# Patient Record
Sex: Female | Born: 1955 | Race: White | Hispanic: No | Smoking: Current every day smoker
Health system: Southern US, Community
[De-identification: ages and names within clinical notes are randomized; demographics above are authoritative.]

## PROBLEM LIST (undated history)

## (undated) DIAGNOSIS — Z720 Tobacco use: Secondary | ICD-10-CM

## (undated) DIAGNOSIS — I4891 Unspecified atrial fibrillation: Secondary | ICD-10-CM

## (undated) DIAGNOSIS — N321 Vesicointestinal fistula: Secondary | ICD-10-CM

## (undated) DIAGNOSIS — I480 Paroxysmal atrial fibrillation: Secondary | ICD-10-CM

## (undated) DIAGNOSIS — I10 Essential (primary) hypertension: Secondary | ICD-10-CM

## (undated) DIAGNOSIS — M109 Gout, unspecified: Secondary | ICD-10-CM

## (undated) DIAGNOSIS — F419 Anxiety disorder, unspecified: Secondary | ICD-10-CM

## (undated) DIAGNOSIS — N824 Other female intestinal-genital tract fistulae: Secondary | ICD-10-CM

## (undated) DIAGNOSIS — I499 Cardiac arrhythmia, unspecified: Secondary | ICD-10-CM

## (undated) HISTORY — DX: Gout, unspecified: M10.9

---

## 1983-07-24 HISTORY — PX: MANDIBLE SURGERY: SHX707

## 2000-07-23 HISTORY — PX: CARPAL TUNNEL RELEASE: SHX101

## 2001-08-20 ENCOUNTER — Ambulatory Visit (HOSPITAL_BASED_OUTPATIENT_CLINIC_OR_DEPARTMENT_OTHER): Admission: RE | Admit: 2001-08-20 | Discharge: 2001-08-20 | Payer: Self-pay | Admitting: *Deleted

## 2018-09-24 ENCOUNTER — Encounter: Payer: Self-pay | Admitting: Family Medicine

## 2018-09-24 ENCOUNTER — Ambulatory Visit (INDEPENDENT_AMBULATORY_CARE_PROVIDER_SITE_OTHER): Payer: 59 | Admitting: Family Medicine

## 2018-09-24 VITALS — BP 130/82 | HR 84 | Temp 98.4°F | Resp 15 | Ht 64.0 in | Wt 130.0 lb

## 2018-09-24 DIAGNOSIS — Z13220 Encounter for screening for lipoid disorders: Secondary | ICD-10-CM

## 2018-09-24 DIAGNOSIS — Z0000 Encounter for general adult medical examination without abnormal findings: Secondary | ICD-10-CM

## 2018-09-24 DIAGNOSIS — Z7689 Persons encountering health services in other specified circumstances: Secondary | ICD-10-CM

## 2018-09-24 DIAGNOSIS — Z1159 Encounter for screening for other viral diseases: Secondary | ICD-10-CM

## 2018-09-24 DIAGNOSIS — F17200 Nicotine dependence, unspecified, uncomplicated: Secondary | ICD-10-CM

## 2018-09-24 DIAGNOSIS — F419 Anxiety disorder, unspecified: Secondary | ICD-10-CM | POA: Diagnosis not present

## 2018-09-24 DIAGNOSIS — Z1231 Encounter for screening mammogram for malignant neoplasm of breast: Secondary | ICD-10-CM | POA: Diagnosis not present

## 2018-09-24 DIAGNOSIS — Z0001 Encounter for general adult medical examination with abnormal findings: Secondary | ICD-10-CM

## 2018-09-24 DIAGNOSIS — J309 Allergic rhinitis, unspecified: Secondary | ICD-10-CM

## 2018-09-24 DIAGNOSIS — Z1211 Encounter for screening for malignant neoplasm of colon: Secondary | ICD-10-CM | POA: Diagnosis not present

## 2018-09-24 DIAGNOSIS — Z130 Encounter for screening for diseases of the blood and blood-forming organs and certain disorders involving the immune mechanism: Secondary | ICD-10-CM

## 2018-09-24 DIAGNOSIS — Z114 Encounter for screening for human immunodeficiency virus [HIV]: Secondary | ICD-10-CM

## 2018-09-24 DIAGNOSIS — Z23 Encounter for immunization: Secondary | ICD-10-CM

## 2018-09-24 MED ORDER — ESCITALOPRAM OXALATE 10 MG PO TABS: ORAL_TABLET | ORAL | 0 refills | Status: DC

## 2018-09-24 MED ORDER — ZOSTER VAC RECOMB ADJUVANTED 50 MCG/0.5ML IM SUSR: 0.5000 mL | Freq: Once | INTRAMUSCULAR | 1 refills | Status: AC

## 2018-09-24 NOTE — Patient Instructions (Signed)
Health Maintenance, Female Adopting a healthy lifestyle and getting preventive care can go a long way to promote health and wellness. Talk with your health care provider about what schedule of regular examinations is right for you. This is a good chance for you to check in with your provider about disease prevention and staying healthy. In between checkups, there are plenty of things you can do on your own. Experts have done a lot of research about which lifestyle changes and preventive measures are most likely to keep you healthy. Ask your health care provider for more information. Weight and diet Eat a healthy diet  Be sure to include plenty of vegetables, fruits, low-fat dairy products, and lean protein.  Do not eat a lot of foods high in solid fats, added sugars, or salt.  Get regular exercise. This is one of the most important things you can do for your health. ? Most adults should exercise for at least 150 minutes each week. The exercise should increase your heart rate and make you sweat (moderate-intensity exercise). ? Most adults should also do strengthening exercises at least twice a week. This is in addition to the moderate-intensity exercise. Maintain a healthy weight  Body mass index (BMI) is a measurement that can be used to identify possible weight problems. It estimates body fat based on height and weight. Your health care provider can help determine your BMI and help you achieve or maintain a healthy weight.  For females 5 years of age and older: ? A BMI below 18.5 is considered underweight. ? A BMI of 18.5 to 24.9 is normal. ? A BMI of 25 to 29.9 is considered overweight. ? A BMI of 30 and above is considered obese. Watch levels of cholesterol and blood lipids  You should start having your blood tested for lipids and cholesterol at 64 years of age, then have this test every 5 years.  You may need to have your cholesterol levels checked more often if: ? Your lipid or  cholesterol levels are high. ? You are older than 64 years of age. ? You are at high risk for heart disease. Cancer screening Lung Cancer  Lung cancer screening is recommended for adults 48-79 years old who are at high risk for lung cancer because of a history of smoking.  A yearly low-dose CT scan of the lungs is recommended for people who: ? Currently smoke. ? Have quit within the past 15 years. ? Have at least a 30-pack-year history of smoking. A pack year is smoking an average of one pack of cigarettes a day for 1 year.  Yearly screening should continue until it has been 15 years since you quit.  Yearly screening should stop if you develop a health problem that would prevent you from having lung cancer treatment. Breast Cancer  Practice breast self-awareness. This means understanding how your breasts normally appear and feel.  It also means doing regular breast self-exams. Let your health care provider know about any changes, no matter how small.  If you are in your 20s or 30s, you should have a clinical breast exam (CBE) by a health care provider every 1-3 years as part of a regular health exam.  If you are 22 or older, have a CBE every year. Also consider having a breast X-ray (mammogram) every year.  If you have a family history of breast cancer, talk to your health care provider about genetic screening.  If you are at high risk for breast cancer, talk  to your health care provider about having an MRI and a mammogram every year.  Breast cancer gene (BRCA) assessment is recommended for women who have family members with BRCA-related cancers. BRCA-related cancers include: ? Breast. ? Ovarian. ? Tubal. ? Peritoneal cancers.  Results of the assessment will determine the need for genetic counseling and BRCA1 and BRCA2 testing. Cervical Cancer Your health care provider may recommend that you be screened regularly for cancer of the pelvic organs (ovaries, uterus, and vagina).  This screening involves a pelvic examination, including checking for microscopic changes to the surface of your cervix (Pap test). You may be encouraged to have this screening done every 3 years, beginning at age 21.  For women ages 30-65, health care providers may recommend pelvic exams and Pap testing every 3 years, or they may recommend the Pap and pelvic exam, combined with testing for human papilloma virus (HPV), every 5 years. Some types of HPV increase your risk of cervical cancer. Testing for HPV may also be done on women of any age with unclear Pap test results.  Other health care providers may not recommend any screening for nonpregnant women who are considered low risk for pelvic cancer and who do not have symptoms. Ask your health care provider if a screening pelvic exam is right for you.  If you have had past treatment for cervical cancer or a condition that could lead to cancer, you need Pap tests and screening for cancer for at least 20 years after your treatment. If Pap tests have been discontinued, your risk factors (such as having a new sexual partner) need to be reassessed to determine if screening should resume. Some women have medical problems that increase the chance of getting cervical cancer. In these cases, your health care provider may recommend more frequent screening and Pap tests. Colorectal Cancer  This type of cancer can be detected and often prevented.  Routine colorectal cancer screening usually begins at 64 years of age and continues through 64 years of age.  Your health care provider may recommend screening at an earlier age if you have risk factors for colon cancer.  Your health care provider may also recommend using home test kits to check for hidden blood in the stool.  A small camera at the end of a tube can be used to examine your colon directly (sigmoidoscopy or colonoscopy). This is done to check for the earliest forms of colorectal cancer.  Routine  screening usually begins at age 50.  Direct examination of the colon should be repeated every 5-10 years through 64 years of age. However, you may need to be screened more often if early forms of precancerous polyps or small growths are found. Skin Cancer  Check your skin from head to toe regularly.  Tell your health care provider about any new moles or changes in moles, especially if there is a change in a mole's shape or color.  Also tell your health care provider if you have a mole that is larger than the size of a pencil eraser.  Always use sunscreen. Apply sunscreen liberally and repeatedly throughout the day.  Protect yourself by wearing long sleeves, pants, a wide-brimmed hat, and sunglasses whenever you are outside. Heart disease, diabetes, and high blood pressure  High blood pressure causes heart disease and increases the risk of stroke. High blood pressure is more likely to develop in: ? People who have blood pressure in the high end of the normal range (130-139/85-89 mm Hg). ? People   who are overweight or obese. ? People who are African American.  If you are 84-22 years of age, have your blood pressure checked every 3-5 years. If you are 67 years of age or older, have your blood pressure checked every year. You should have your blood pressure measured twice-once when you are at a hospital or clinic, and once when you are not at a hospital or clinic. Record the average of the two measurements. To check your blood pressure when you are not at a hospital or clinic, you can use: ? An automated blood pressure machine at a pharmacy. ? A home blood pressure monitor.  If you are between 52 years and 3 years old, ask your health care provider if you should take aspirin to prevent strokes.  Have regular diabetes screenings. This involves taking a blood sample to check your fasting blood sugar level. ? If you are at a normal weight and have a low risk for diabetes, have this test once  every three years after 64 years of age. ? If you are overweight and have a high risk for diabetes, consider being tested at a younger age or more often. Preventing infection Hepatitis B  If you have a higher risk for hepatitis B, you should be screened for this virus. You are considered at high risk for hepatitis B if: ? You were born in a country where hepatitis B is common. Ask your health care provider which countries are considered high risk. ? Your parents were born in a high-risk country, and you have not been immunized against hepatitis B (hepatitis B vaccine). ? You have HIV or AIDS. ? You use needles to inject street drugs. ? You live with someone who has hepatitis B. ? You have had sex with someone who has hepatitis B. ? You get hemodialysis treatment. ? You take certain medicines for conditions, including cancer, organ transplantation, and autoimmune conditions. Hepatitis C  Blood testing is recommended for: ? Everyone born from [birth year] through 1966. ? Anyone with known risk factors for hepatitis C. Sexually transmitted infections (STIs)  You should be screened for sexually transmitted infections (STIs) including gonorrhea and chlamydia if: ? You are sexually active and are younger than 64 years of age. ? You are older than 64 years of age and your health care provider tells you that you are at risk for this type of infection. ? Your sexual activity has changed since you were last screened and you are at an increased risk for chlamydia or gonorrhea. Ask your health care provider if you are at risk.  If you do not have HIV, but are at risk, it may be recommended that you take a prescription medicine daily to prevent HIV infection. This is called pre-exposure prophylaxis (PrEP). You are considered at risk if: ? You are sexually active and do not regularly use condoms or know the HIV status of your partner(s). ? You take drugs by injection. ? You are sexually active with a partner  who has HIV. Talk with your health care provider about whether you are at high risk of being infected with HIV. If you choose to begin PrEP, you should first be tested for HIV. You should then be tested every 3 months for as long as you are taking PrEP. Pregnancy  If you are premenopausal and you may become pregnant, ask your health care provider about preconception counseling.  If you may become pregnant, take 400 to 800 micrograms (mcg) of folic acid every  day.  If you want to prevent pregnancy, talk to your health care provider about birth control (contraception). Osteoporosis and menopause  Osteoporosis is a disease in which the bones lose minerals and strength with aging. This can result in serious bone fractures. Your risk for osteoporosis can be identified using a bone density scan.  If you are 72 years of age or older, or if you are at risk for osteoporosis and fractures, ask your health care provider if you should be screened.  Ask your health care provider whether you should take a calcium or vitamin D supplement to lower your risk for osteoporosis.  Menopause may have certain physical symptoms and risks.  Hormone replacement therapy may reduce some of these symptoms and risks. Talk to your health care provider about whether hormone replacement therapy is right for you. Follow these instructions at home:  Schedule regular health, dental, and eye exams.  Stay current with your immunizations.  Do not use any tobacco products including cigarettes, chewing tobacco, or electronic cigarettes.  If you are pregnant, do not drink alcohol.  If you are breastfeeding, limit how much and how often you drink alcohol.  Limit alcohol intake to no more than 1 drink per day for nonpregnant women. One drink equals 12 ounces of beer, 5 ounces of wine, or 1 ounces of hard liquor.  Do not use street drugs.  Do not share needles.  Ask your health care provider for help if you need support  or information about quitting drugs.  Tell your health care provider if you often feel depressed.  Tell your health care provider if you have ever been abused or do not feel safe at home. This information is not intended to replace advice given to you by your health care provider. Make sure you discuss any questions you have with your health care provider. Document Released: 01/22/2011 Document Revised: 12/15/2015 Document Reviewed: 04/12/2015 Elsevier Interactive Patient Education  2019 [employer].   Colonoscopy, Adult A colonoscopy is an exam to look at the entire large intestine. During the exam, a lubricated, flexible tube that has a camera on the end of it is inserted into the anus and then passed into the rectum, colon, and other parts of the large intestine. You may have a colonoscopy as a part of normal colorectal screening or if you have certain symptoms, such as:  Lack of red blood cells (anemia).  Diarrhea that does not go away.  Abdominal pain.  Blood in your stool (feces). A colonoscopy can help screen for and diagnose medical problems, including:  Tumors.  Polyps.  Inflammation.  Areas of bleeding. Tell a health care provider about:  Any allergies you have.  All medicines you are taking, including vitamins, herbs, eye drops, creams, and over-the-counter medicines.  Any problems you or family members have had with anesthetic medicines.  Any blood disorders you have.  Any surgeries you have had.  Any medical conditions you have.  Any problems you have had passing stool. What are the risks? Generally, this is a safe procedure. However, problems may occur, including:  Bleeding.  A tear in the intestine.  A reaction to medicines given during the exam.  Infection (rare). What happens before the procedure? Eating and drinking restrictions Follow instructions from your health care provider about eating and drinking, which may include:  A few days  before the procedure - follow a low-fiber diet. Avoid nuts, seeds, dried fruit, raw fruits, and vegetables.  1-3 days before the  procedure - follow a clear liquid diet. Drink only clear liquids, such as clear broth or bouillon, black coffee or tea, clear juice, clear soft drinks or sports drinks, gelatin dessert, and popsicles. Avoid any liquids that contain red or purple dye.  On the day of the procedure - do not eat or drink anything starting 2 hours before the procedure, or within the time period that your health care provider recommends. Up to 2 hours before the procedure, you may continue to drink clear liquids, such as water or clear fruit juice. Bowel prep If you were prescribed an oral bowel prep to clean out your colon:  Take it as told by your health care provider. Starting the day before your procedure, you will need to drink a large amount of medicated liquid. The liquid will cause you to have multiple loose stools until your stool is almost clear or light green.  If your skin or anus gets irritated from diarrhea, you may use these to relieve the irritation: ? Medicated wipes, such as adult wet wipes with aloe and vitamin E. ? A skin-soothing product like petroleum jelly.  If you vomit while drinking the bowel prep, take a break for up to 60 minutes and then begin the bowel prep again. If vomiting continues and you cannot take the bowel prep without vomiting, call your health care provider.  To clean out your colon, you may also be given: ? Laxative medicines. ? Instructions about how to use an enema. General instructions  Ask your health care provider about: ? Changing or stopping your regular medicines or supplements. This is especially important if you are taking iron supplements, diabetes medicines, or blood thinners. ? Taking medicines such as aspirin and ibuprofen. These medicines can thin your blood. Do not take these medicines before the procedure if your health care  provider tells you not to.  Plan to have someone take you home from the hospital or clinic. What happens during the procedure?   An IV may be inserted into one of your veins.  You will be given medicine to help you relax (sedative).  To reduce your risk of infection: ? Your health care team will wash or sanitize their hands. ? Your anal area will be washed with soap.  You will be asked to lie on your side with your knees bent.  Your health care provider will lubricate a long, thin, flexible tube. The tube will have a camera and a light on the end.  The tube will be inserted into your anus.  The tube will be gently eased through your rectum and colon.  Air will be delivered into your colon to keep it open. You may feel some pressure or cramping.  The camera will be used to take images during the procedure.  A small tissue sample may be removed to be examined under a microscope (biopsy).  If small polyps are found, your health care provider may remove them and have them checked for cancer cells.  When the exam is done, the tube will be removed. The procedure may vary among health care providers and hospitals. What happens after the procedure?  Your blood pressure, heart rate, breathing rate, and blood oxygen level will be monitored until the medicines you were given have worn off.  Do not drive for 24 hours after the exam.  You may have a small amount of blood in your stool.  You may pass gas and have mild abdominal cramping or bloating due to  the air that was used to inflate your colon during the exam.  It is up to you to get the results of your procedure. Ask your health care provider, or the department performing the procedure, when your results will be ready. Summary  A colonoscopy is an exam to look at the entire large intestine.  During a colonoscopy, a lubricated, flexible tube with a camera on the end of it is inserted into the anus and then passed into the colon  and other parts of the large intestine.  Follow instructions from your health care provider about eating and drinking before the procedure.  If you were prescribed an oral bowel prep to clean out your colon, take it as told by your health care provider.  After your procedure, your blood pressure, heart rate, breathing rate, and blood oxygen level will be monitored until the medicines you were given have worn off. This information is not intended to replace advice given to you by your health care provider. Make sure you discuss any questions you have with your health care provider. Document Released: 07/06/2000 Document Revised: 05/01/2017 Document Reviewed: 09/20/2015 Elsevier Interactive Patient Education  2019 [facility]) Exercise Recommendation  Being physically active is important to prevent heart disease and stroke, the nation's No. 1and No. 5killers. To improve overall cardiovascular health, we suggest at least 150 minutes per week of moderate exercise or 75 minutes per week of vigorous exercise (or a combination of moderate and vigorous activity). Thirty minutes a day, five times a week is an easy goal to remember. You will also experience benefits even if you divide your time into two or three segments of 10 to 15 minutes per day.  For people who would benefit from lowering their blood pressure or cholesterol, we recommend 40 minutes of aerobic exercise of moderate to vigorous intensity three to four times a week to lower the risk for heart attack and stroke.  Physical activity is anything that makes you move your body and burn calories.  This includes things like climbing stairs or playing sports. Aerobic exercises benefit your heart, and include walking, jogging, swimming or biking. Strength and stretching exercises are best for overall stamina and flexibility.  The simplest, positive change you can make to effectively improve your heart health is  to start walking. It's enjoyable, free, easy, social and great exercise. A walking program is flexible and boasts high success rates because people can stick with it. It's easy for walking to become a regular and satisfying part of life.   For Overall Cardiovascular Health:  At least 30 minutes of moderate-intensity aerobic activity at least 5 days per week for a total of 150  OR   At least 25 minutes of vigorous aerobic activity at least 3 days per week for a total of 75 minutes; or a combination of moderate- and vigorous-intensity aerobic activity  AND   Moderate- to high-intensity muscle-strengthening activity at least 2 days per week for additional health benefits.  For Lowering Blood Pressure and Cholesterol  An average 40 minutes of moderate- to vigorous-intensity aerobic activity 3 or 4 times per week  What if I can't make it to the time goal? Something is always better than nothing! And everyone has to start somewhere. Even if you've been sedentary for years, today is the day you can begin to make healthy changes in your life. If you don't think you'll make it for 30 or 40 minutes, set a reachable  goal for today. You can work up toward your overall goal by increasing your time as you get stronger. Don't let all-or-nothing thinking rob you of doing what you can every day.  Source:http://www.heart.org

## 2018-09-24 NOTE — Assessment & Plan Note (Signed)
Start lexapro 10 mg and can increase to 20 mg if needed in 2 weeks, f/up appt in 4-6 weeks. 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

## 2018-09-24 NOTE — Progress Notes (Signed)
Patient: Brandi Rowe, Female    DOB: 04-16-1956, 63 y.o.   MRN: [id number] Visit Date: 09/24/2018  Today's Provider: Danelle Berry, PA-C   Chief Complaint  Patient presents with  . Annual Exam    Patient is fasting today. Has not had a physical in 15-20 years.    Subjective:    Annual physical exam Brandi Rowe is a 63 y.o. female who presents today for health maintenance and complete physical. She feels well. She reports exercising . She reports she is sleeping well.  Goes to bed 8:30 - 4:30am Partner together 27 years -----------------------------------------------------------------  Hx of back pain, onset 20 years ago, certain movements, strenuous activity, or sitting for a long time, pain low back, moreso on right side, with radiation to her leg, "sciatic" flares up here and there.  Anxiety - worries about things, storms, driving, things going wrong minor even just a leak in her sink would really upset - most sx are racing heart and BP elevating, sleeping ok Seem to be getting worse, says she has OCD, shes a "checker" a routine and methodical about her routine for checking.  Mom has it and brother has it.  She notes that becomes a bother to him that she cannot drive herself or driving short distances as a passenger with her husband driving she is so anxious that they have stopped going places or the right is very burdensome and no longer enjoyable to go do things together or take trips together.  She would like to try something to help her with her worsening anxiety.  Smoking - 1/2 ppd "forever" quit off and on, she's trying nicorette, patches cause nightmares, desires to quit, but is unable to.  Stomach problems - she endorses hx of acid reflux for years, uses chewable tums, no PPI of H2 blocker in the past.  She denies any abdominal pain, dysphasia, melena, hematochezia  Hx of fairly severe environmental and seasonal allergies, she only takes benadryl PRN, no daily or  prescribed meds  No weight changes no skin or hair changes except for over the years hair has gradually been thinning.   Depression screening: Depression screen [facility] 2/9 09/24/2018  Decreased Interest 1  Down, Depressed, Hopeless 0  PHQ - 2 Score 1  Altered sleeping 0  Tired, decreased energy 1  Change in appetite 0  Feeling bad or failure about yourself  0  Trouble concentrating 0  Moving slowly or fidgety/restless 0  Suicidal thoughts 0  PHQ-9 Score 2  Difficult doing work/chores Somewhat difficult   GAD 7:   09/24/18 0829  GAD-7 Over the last 2 weeks, how often have you been bothered by the following problems?  1. Feeling Nervous, Anxious, or on Edge 3  2. Not Being Able to Stop or Control Worrying 1  3. Worrying Too Much About Different Things 1  4. Trouble Relaxing 0  5. Being So Restless it's Hard To Sit Still 0  6. Becoming Easily Annoyed or Irritable 1  7. Feeling Afraid As If Something Awful Might Happen 3  Total GAD-7 Score 9  Anxiety Difficulty  Difficulty At Work, Home, or Getting  Along With Others? Not difficult at all        Review of Systems  Constitutional: Negative.   HENT: Negative.   Eyes: Negative.   Respiratory: Negative.   Cardiovascular: Negative.   Gastrointestinal: Negative.   Endocrine: Negative.   Genitourinary: Negative.   Musculoskeletal: Negative.   Skin: Negative.  Allergic/Immunologic: Negative.   Neurological: Negative.   Hematological: Negative.   Psychiatric/Behavioral: Negative.   All other systems reviewed and are negative.   Social History      She  reports that she has been smoking cigarettes. She has been smoking about 0.50 packs per day. She has never used smokeless tobacco. She reports current alcohol use of about 7.0 standard drinks of alcohol per week. She reports that she does not use drugs.       Social History   Socioeconomic History  . Marital status: Divorced    Spouse name: Not on file  . Number of  children: 0  . Years of education: Not on file  . Highest education level: Some college, no degree  Occupational History    Employer: [employer]  Social Needs  . Financial resource strain: Not on file  . Food insecurity:    Worry: Not on file    Inability: Not on file  . Transportation needs:    Medical: Not on file    Non-medical: Not on file  Tobacco Use  . Smoking status: Current Every Day Smoker    Packs/day: 0.50    Types: Cigarettes  . Smokeless tobacco: Never Used  Substance and Sexual Activity  . Alcohol use: Yes    Alcohol/week: 7.0 standard drinks    Types: 7 Glasses of wine per week  . Drug use: Never  . Sexual activity: Not Currently    Partners: Male  Lifestyle  . Physical activity:    Days per week: 0 days    Minutes per session: 0 min  . Stress: Rather much  Relationships  . Social connections:    Talks on phone: Once a week    Gets together: Once a week    Attends religious service: Never    Active member of club or organization: No    Attends meetings of clubs or organizations: Never    Relationship status: Divorced  Other Topics Concern  . Not on file  Social History Narrative  . Not on file    History reviewed. No pertinent past medical history.   Patient Active Problem List   Diagnosis Date Noted  . Anxiety disorder, unspecified 09/24/2018      Family History        Family Status  Relation Name Status  . Mother  Deceased  . Father  Deceased  . Brother  (Not Specified)  . MGM  Deceased  . MGF  Deceased        Her family history includes Atrial fibrillation in her brother; Congestive Heart Failure in her mother; Glaucoma in her maternal grandfather; Heart disease in her maternal grandmother; Pancreatic cancer in her father; Stroke in her maternal grandmother; Uterine cancer in her mother.      Allergies  Allergen Reactions  . Grass Extracts [Gramineae Pollens]     Dust, mold, mites     Current Outpatient Medications:  .   aspirin EC 81 MG tablet, Take 81 mg by mouth daily., Disp: , Rfl:  .  diphenhydrAMINE (BENADRYL) 25 MG tablet, Take 25 mg by mouth at bedtime., Disp: , Rfl:  .  Zoster Vaccine Adjuvanted [facility]) injection, Inject 0.5 mLs into the muscle once for 1 dose. And repeat once more in 2-6 months, Disp: 0.5 mL, Rfl: 1   Patient Care Team: Danelle Berry, PA-C as PCP - General (Family Medicine)      Objective:   Vitals: BP 130/82   Pulse 84  Temp 98.4 F (36.9 C) (Oral)   Resp 15   Ht  (1.626 m)   Wt 130 lb (59 kg)   SpO2 100%   BMI 22.31 kg/m       Physical Exam Vitals signs and nursing note reviewed.  Constitutional:      General: She is not in acute distress.    Appearance: Normal appearance. She is well-developed. She is not ill-appearing, toxic-appearing or diaphoretic.  HENT:     Head: Normocephalic and atraumatic.     Right Ear: Tympanic membrane, ear canal and external ear normal. There is no impacted cerumen.     Left Ear: Tympanic membrane, ear canal and external ear normal. There is no impacted cerumen.     Nose: Rhinorrhea present. No congestion.     Mouth/Throat:     Mouth: Mucous membranes are moist.     Pharynx: Oropharynx is clear. Uvula midline. No posterior oropharyngeal erythema.  Eyes:     General: Lids are normal. No scleral icterus.       Right eye: No discharge.        Left eye: No discharge.     Conjunctiva/sclera: Conjunctivae normal.     Pupils: Pupils are equal, round, and reactive to light.  Neck:     Musculoskeletal: Normal range of motion and neck supple.     Thyroid: No thyromegaly.     Trachea: Trachea and phonation normal. No tracheal deviation.  Cardiovascular:     Rate and Rhythm: Normal rate and regular rhythm.     Pulses: Normal pulses.          Radial pulses are 2+ on the right side and 2+ on the left side.       Posterior tibial pulses are 2+ on the right side and 2+ on the left side.     Heart sounds: Normal heart sounds. No  murmur. No friction rub. No gallop.   Pulmonary:     Effort: Pulmonary effort is normal. No respiratory distress.     Breath sounds: Normal breath sounds. No stridor. No wheezing, rhonchi or rales.  Chest:     Chest wall: No tenderness.  Abdominal:     General: Bowel sounds are normal. There is no distension.     Palpations: Abdomen is soft.     Tenderness: There is no abdominal tenderness. There is no right CVA tenderness, left CVA tenderness, guarding or rebound.  Musculoskeletal: Normal range of motion.        General: No deformity.  Lymphadenopathy:     Cervical: No cervical adenopathy.  Skin:    General: Skin is warm and dry.     Capillary Refill: Capillary refill takes less than 2 seconds.     Coloration: Skin is not pale.     Findings: No rash.  Neurological:     Mental Status: She is alert and oriented to person, place, and time.     Motor: No abnormal muscle tone.     Gait: Gait normal.  Psychiatric:        Attention and Perception: Attention and perception normal.        Mood and Affect: Affect normal. Mood is anxious.        Speech: Speech normal.        Behavior: Behavior normal. Behavior is cooperative.        Thought Content: Thought content is not paranoid or delusional. Thought content does not include homicidal or suicidal ideation. Thought content does not  include homicidal or suicidal plan.        Cognition and Memory: Cognition normal.      Depression Screen PHQ 2/9 Scores 09/24/2018  PHQ - 2 Score 1  PHQ- 9 Score 2      Office Visit from 09/24/2018 in [facility]  Alcohol Use Disorder Identification Test Final Score (AUDIT)  4       Assessment & Plan:     Routine Health Maintenance and Physical Exam  Exercise Activities and Dietary recommendations Goals - encouraged exercise, low fat diet       Discussed health benefits of physical activity, and encouraged her to engage in regular exercise appropriate for her age and condition.     Immunization History  Administered Date(s) Administered  . Influenza Split 04/16/2013  . Influenza,inj,Quad PF,6+ Mos 04/20/2014  Flu was done Sept 2019 - [facility]  Health Maintenance  Topic Date Due  . MAMMOGRAM  09/19/2004  . COLONOSCOPY  09/19/2004  . PAP SMEAR-Modifier  10/27/2018 (Originally 09/20/1975)  . TETANUS/TDAP  09/24/2019 (Originally 09/19/1973)  . INFLUENZA VACCINE  Completed  . Hepatitis C Screening  Completed  . HIV Screening  Completed   Pt encouraged to return for PAP - would probably be her last one, pt was very hesitant to do so and declined today. Tdap - refused  Cell and phone number -  Work [phone]  Problem List Items Addressed This Visit      Other   Anxiety disorder, unspecified    Start lexapro 10 mg and can increase to 20 mg if needed in 2 weeks, f/up appt in 4-6 weeks. Therapy would likely be very helpful to help with her triggers and OCD-type sx      Relevant Medications   escitalopram (LEXAPRO) 10 MG tablet   Current every day smoker    Discussed smoking cessation, continue gum as able, avoid patches, if psych issues get stable, would try chantix - pt encouraged to reach out to insurance/employer see if they have any programs to assist. More than 5 min spend discussing smoking cessation      Relevant Orders   CBC with Differential/Platelet (Completed)   Lipid panel (Completed)   Hemoglobin A1c (Completed)    Other Visit Diagnoses    Routine general medical examination at a health care facility    -  Primary   records?  pt says there are none, reviewed history per chart   Relevant Orders   CBC with Differential/Platelet (Completed)   COMPLETE METABOLIC PANEL WITH GFR (Completed)   Lipid panel (Completed)   TSH (Completed)   Hemoglobin A1c (Completed)   Visit for screening mammogram       pt very hesitant, offered to order and pt can decide   Relevant Orders   MM Digital Screening   Colon cancer screening       pt hesitant -  ordered and pt notified that she can think about it and decline later if she refuses to do   Relevant Orders   Ambulatory referral to Gastroenterology   Screening for deficiency anemia       Relevant Orders   CBC with Differential/Platelet (Completed)   Iron, TIBC and Ferritin Panel (Completed)   Vitamin B12 (Completed)   TEST AUTHORIZATION (Completed)   Screening for lipoid disorders       Relevant Orders   COMPLETE METABOLIC PANEL WITH GFR (Completed)   Lipid panel (Completed)   Need for shingles vaccine  vaccine order sent to pt's pharmacy - explained shots/timing/effectiveness   Screening for HIV without presence of risk factors       Relevant Orders   HIV Antibody (routine testing w rflx) (Completed)   Encounter for hepatitis C screening test for low risk patient       Relevant Orders   Hepatitis C antibody (Completed)   Encounter to establish care with new doctor       no records per pt, some low back imaging done 20 years ago, doesn't know where to get   Allergic rhinitis, unspecified seasonality, unspecified trigger       encouraged to use 2nd generation antihistamine daily and steroid nasal spray, d/c benadryl and only use PRN      Discussed anxiety and her obsessive compulsive sx at length today.  I have discussed with her that we could pick a medicine that helps with anxiety and has some effectiveness with OCD but that she would likely need to see a specialist such as psychiatry and utilize adjunct talk therapy or cognitive behavioral therapy.  We discussed how anxiety disorders in general are most effectively treated and symptoms minimized with both a combination of therapy and medication.  Patient was willing to try medications because her symptoms have started to affect her life, she avoids going out, she avoids driving.  She is sleeping well so I did pick Lexapro to avoid other medications that have more sedating side effects she does not seem to need that.  We  discussed low dosing and increasing the dose as needed in a few weeks from now and a 6-week follow-up to reassess.  Encouraged to check with her insurance for in network providers which do therapy or psychiatry.   Referral ordered, but I am not sure if pt would be willing to go, she is very guarded and hesitant about most things we discussed today.    Danelle Berry, PA-C 09/24/18 2:11 PM  [facility]

## 2018-09-25 ENCOUNTER — Encounter: Payer: Self-pay | Admitting: Family Medicine

## 2018-09-25 ENCOUNTER — Encounter (INDEPENDENT_AMBULATORY_CARE_PROVIDER_SITE_OTHER): Payer: Self-pay | Admitting: *Deleted

## 2018-09-25 DIAGNOSIS — F17200 Nicotine dependence, unspecified, uncomplicated: Secondary | ICD-10-CM | POA: Insufficient documentation

## 2018-09-25 DIAGNOSIS — D751 Secondary polycythemia: Secondary | ICD-10-CM | POA: Insufficient documentation

## 2018-09-25 NOTE — Progress Notes (Signed)
All comments bounce back to me, please make sure to [facility] the "route" box if there is nothing for me to do again

## 2018-09-26 LAB — COMPLETE METABOLIC PANEL WITH GFR
AG Ratio: 1.8 (calc) (ref 1.0–2.5)
ALBUMIN MSPROF: 4.6 g/dL (ref 3.6–5.1)
ALT: 20 U/L (ref 6–29)
AST: 24 U/L (ref 10–35)
Alkaline phosphatase (APISO): 67 U/L (ref 37–153)
BUN: 18 mg/dL (ref 7–25)
CO2: 28 mmol/L (ref 20–32)
CREATININE: 0.76 mg/dL (ref 0.50–0.99)
Calcium: 9.9 mg/dL (ref 8.6–10.4)
Chloride: 102 mmol/L (ref 98–110)
GFR, Est African American: 96 mL/min/{1.73_m2} (ref >=60)
GFR, Est Non African American: 83 mL/min/{1.73_m2} (ref >=60)
Globulin: 2.5 g/dL (calc) (ref 1.9–3.7)
Glucose, Bld: 97 mg/dL (ref 65–99)
Potassium: 4.7 mmol/L (ref 3.5–5.3)
Sodium: 141 mmol/L (ref 135–146)
Total Bilirubin: 0.4 mg/dL (ref 0.2–1.2)
Total Protein: 7.1 g/dL (ref 6.1–8.1)

## 2018-09-26 LAB — CBC WITH DIFFERENTIAL/PLATELET
Absolute Monocytes: 485 cells/uL (ref 200–950)
BASOS PCT: 0.8 %
Basophils Absolute: 62 cells/uL (ref 0–200)
Eosinophils Absolute: 123 cells/uL (ref 15–500)
Eosinophils Relative: 1.6 %
HCT: 47.1 % — ABNORMAL HIGH (ref 35.0–45.0)
Hemoglobin: 16.3 g/dL — ABNORMAL HIGH (ref 11.7–15.5)
Lymphs Abs: 1448 cells/uL (ref 850–3900)
MCH: 34.6 pg — AB (ref 27.0–33.0)
MCHC: 34.6 g/dL (ref 32.0–36.0)
MCV: 100 fL (ref 80.0–100.0)
MPV: 11 fL (ref 7.5–12.5)
Monocytes Relative: 6.3 %
Neutro Abs: 5583 cells/uL (ref 1500–7800)
Neutrophils Relative %: 72.5 %
Platelets: 215 10*3/uL (ref 140–400)
RBC: 4.71 10*6/uL (ref 3.80–5.10)
RDW: 12.8 % (ref 11.0–15.0)
Total Lymphocyte: 18.8 %
WBC: 7.7 10*3/uL (ref 3.8–10.8)

## 2018-09-26 LAB — HEPATITIS C ANTIBODY
Hepatitis C Ab: NONREACTIVE
SIGNAL TO CUT-OFF: 0.01 (ref <1.00)

## 2018-09-26 LAB — HEMOGLOBIN A1C
Hgb A1c MFr Bld: 5.4 % of total Hgb (ref <5.7)
Mean Plasma Glucose: 108 (calc)
eAG (mmol/L): 6 (calc)

## 2018-09-26 LAB — HIV ANTIBODY (ROUTINE TESTING W REFLEX): HIV: NONREACTIVE

## 2018-09-26 LAB — VITAMIN B12: Vitamin B-12: 488 pg/mL (ref 200–1100)

## 2018-09-26 LAB — LIPID PANEL
CHOL/HDL RATIO: 2.1 (calc) (ref <5.0)
Cholesterol: 221 mg/dL — ABNORMAL HIGH (ref <200)
HDL: 104 mg/dL (ref >=50)
LDL Cholesterol (Calc): 101 mg/dL (calc) — ABNORMAL HIGH
Non-HDL Cholesterol (Calc): 117 mg/dL (calc) (ref <130)
Triglycerides: 69 mg/dL (ref <150)

## 2018-09-26 LAB — TSH: TSH: 1.42 mIU/L (ref 0.40–4.50)

## 2018-09-26 LAB — IRON,TIBC AND FERRITIN PANEL
%SAT: 31 % (ref 16–45)
Ferritin: 150 ng/mL (ref 16–288)
Iron: 118 ug/dL (ref 45–160)
TIBC: 379 mcg/dL (calc) (ref 250–450)

## 2018-09-26 LAB — TEST AUTHORIZATION

## 2018-09-26 NOTE — Assessment & Plan Note (Signed)
Discussed smoking cessation, continue gum as able, avoid patches, if psych issues get stable, would try chantix - pt encouraged to reach out to insurance/employer see if they have any programs to assist. More than 5 min spend discussing smoking cessation

## 2018-09-30 ENCOUNTER — Other Ambulatory Visit: Payer: Self-pay | Admitting: Family Medicine

## 2018-09-30 MED ORDER — SIMVASTATIN 10 MG PO TABS: 10.0000 mg | ORAL_TABLET | Freq: Every day | ORAL | 1 refills | Status: DC

## 2019-03-25 ENCOUNTER — Ambulatory Visit (INDEPENDENT_AMBULATORY_CARE_PROVIDER_SITE_OTHER): Payer: 59 | Admitting: Family Medicine

## 2019-03-25 ENCOUNTER — Other Ambulatory Visit: Payer: Self-pay

## 2019-03-25 DIAGNOSIS — Z23 Encounter for immunization: Secondary | ICD-10-CM

## 2019-04-09 ENCOUNTER — Ambulatory Visit: Payer: Self-pay

## 2019-07-01 ENCOUNTER — Encounter: Payer: Self-pay | Admitting: Family Medicine

## 2019-09-26 ENCOUNTER — Ambulatory Visit: Payer: Self-pay | Attending: Internal Medicine

## 2019-09-26 DIAGNOSIS — Z23 Encounter for immunization: Secondary | ICD-10-CM | POA: Insufficient documentation

## 2019-09-26 NOTE — Progress Notes (Signed)
   Covid-19 Vaccination Clinic  Name:  [name]    MRN: [id number] DOB: 1956-06-28  09/26/2019  [name] was observed post Covid-19 immunization for 15 minutes without incident. She was provided with Vaccine Information Sheet and instruction to access the V-Safe system.   [name] was instructed to call 911 with any severe reactions post vaccine: Marland Kitchen Difficulty breathing  . Swelling of face and throat  . A fast heartbeat  . A bad rash all over body  . Dizziness and weakness   Immunizations Administered    Name Date Dose VIS Date Route   Moderna COVID-19 Vaccine 09/26/2019 10:51 AM 0.5 mL 06/23/2019 Intramuscular   Manufacturer: Moderna   Lot: [id number]   NDC: 03546-568-12

## 2019-10-28 ENCOUNTER — Ambulatory Visit: Payer: Self-pay | Attending: Internal Medicine

## 2019-10-28 DIAGNOSIS — Z23 Encounter for immunization: Secondary | ICD-10-CM

## 2019-10-28 NOTE — Progress Notes (Signed)
   Covid-19 Vaccination Clinic  Name:  Brandi Rowe    MRN: [id number] DOB: 25-Jun-1956  10/28/2019  [name] was observed post Covid-19 immunization for 15 minutes without incident. She was provided with Vaccine Information Sheet and instruction to access the V-Safe system.   [name] was instructed to call 911 with any severe reactions post vaccine: Marland Kitchen Difficulty breathing  . Swelling of face and throat  . A fast heartbeat  . A bad rash all over body  . Dizziness and weakness   Immunizations Administered    Name Date Dose VIS Date Route   Moderna COVID-19 Vaccine 10/28/2019 11:29 AM 0.5 mL 06/23/2019 Intramuscular   Manufacturer: Gala Murdoch   Lot: [id number]   NDC: 16435-391-22

## 2019-10-29 ENCOUNTER — Ambulatory Visit: Payer: Self-pay

## 2019-12-03 ENCOUNTER — Ambulatory Visit: Payer: Self-pay | Admitting: Family Medicine

## 2020-04-27 ENCOUNTER — Ambulatory Visit (INDEPENDENT_AMBULATORY_CARE_PROVIDER_SITE_OTHER): Payer: Medicare Other

## 2020-04-27 ENCOUNTER — Other Ambulatory Visit: Payer: Self-pay

## 2020-04-27 DIAGNOSIS — Z23 Encounter for immunization: Secondary | ICD-10-CM

## 2020-05-06 ENCOUNTER — Ambulatory Visit (INDEPENDENT_AMBULATORY_CARE_PROVIDER_SITE_OTHER): Payer: Medicare Other | Admitting: Family Medicine

## 2020-05-06 ENCOUNTER — Encounter: Payer: Self-pay | Admitting: Family Medicine

## 2020-05-06 ENCOUNTER — Other Ambulatory Visit: Payer: Self-pay

## 2020-05-06 VITALS — BP 122/64 | HR 64 | Temp 98.1°F | Resp 14 | Ht 64.0 in | Wt 121.0 lb

## 2020-05-06 DIAGNOSIS — F419 Anxiety disorder, unspecified: Secondary | ICD-10-CM | POA: Diagnosis not present

## 2020-05-06 DIAGNOSIS — R2241 Localized swelling, mass and lump, right lower limb: Secondary | ICD-10-CM

## 2020-05-06 MED ORDER — DIAZEPAM 5 MG PO TABS: 5.0000 mg | ORAL_TABLET | Freq: Two times a day (BID) | ORAL | 0 refills | Status: DC | PRN

## 2020-05-06 MED ORDER — INDOMETHACIN 50 MG PO CAPS: 50.0000 mg | ORAL_CAPSULE | Freq: Three times a day (TID) | ORAL | 0 refills | Status: DC

## 2020-05-06 NOTE — Patient Instructions (Signed)
F/u SCHEDULE A PHYSICAL
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
## 2020-05-07 LAB — COMPREHENSIVE METABOLIC PANEL
AG Ratio: 1.4 (calc) (ref 1.0–2.5)
ALT: 12 U/L (ref 6–29)
AST: 17 U/L (ref 10–35)
Albumin: 4.2 g/dL (ref 3.6–5.1)
Alkaline phosphatase (APISO): 121 U/L (ref 37–153)
BUN: 15 mg/dL (ref 7–25)
CO2: 25 mmol/L (ref 20–32)
Calcium: 9.8 mg/dL (ref 8.6–10.4)
Chloride: 103 mmol/L (ref 98–110)
Creat: 0.95 mg/dL (ref 0.50–0.99)
Globulin: 3.1 g/dL (calc) (ref 1.9–3.7)
Glucose, Bld: 97 mg/dL (ref 65–99)
Potassium: 5.4 mmol/L — ABNORMAL HIGH (ref 3.5–5.3)
Sodium: 140 mmol/L (ref 135–146)
Total Bilirubin: 0.5 mg/dL (ref 0.2–1.2)
Total Protein: 7.3 g/dL (ref 6.1–8.1)

## 2020-05-07 LAB — CBC WITH DIFFERENTIAL/PLATELET
Absolute Monocytes: 662 cells/uL (ref 200–950)
Basophils Absolute: 74 cells/uL (ref 0–200)
Basophils Relative: 0.8 %
Eosinophils Absolute: 193 cells/uL (ref 15–500)
Eosinophils Relative: 2.1 %
HCT: 49.1 % — ABNORMAL HIGH (ref 35.0–45.0)
Hemoglobin: 16.9 g/dL — ABNORMAL HIGH (ref 11.7–15.5)
Lymphs Abs: 1334 cells/uL (ref 850–3900)
MCH: 35.5 pg — ABNORMAL HIGH (ref 27.0–33.0)
MCHC: 34.4 g/dL (ref 32.0–36.0)
MCV: 103.2 fL — ABNORMAL HIGH (ref 80.0–100.0)
MPV: 10.8 fL (ref 7.5–12.5)
Monocytes Relative: 7.2 %
Neutro Abs: 6937 cells/uL (ref 1500–7800)
Neutrophils Relative %: 75.4 %
Platelets: 264 10*3/uL (ref 140–400)
RBC: 4.76 10*6/uL (ref 3.80–5.10)
RDW: 12.7 % (ref 11.0–15.0)
Total Lymphocyte: 14.5 %
WBC: 9.2 10*3/uL (ref 3.8–10.8)

## 2020-05-07 LAB — URIC ACID: Uric Acid, Serum: 8 mg/dL — ABNORMAL HIGH (ref 2.5–7.0)

## 2020-05-08 ENCOUNTER — Encounter: Payer: Self-pay | Admitting: Family Medicine

## 2020-05-08 NOTE — Assessment & Plan Note (Signed)
Prn valium given , discussed this is not a daily med At her CPE need to gauge how often she has used

## 2020-05-09 ENCOUNTER — Encounter: Payer: Self-pay | Admitting: Family Medicine

## 2020-05-09 DIAGNOSIS — M109 Gout, unspecified: Secondary | ICD-10-CM | POA: Insufficient documentation

## 2020-05-17 ENCOUNTER — Encounter: Payer: Self-pay | Admitting: Family Medicine

## 2020-08-24 ENCOUNTER — Encounter: Payer: Self-pay | Admitting: Family Medicine

## 2020-08-26 ENCOUNTER — Ambulatory Visit (INDEPENDENT_AMBULATORY_CARE_PROVIDER_SITE_OTHER): Payer: Medicare Other | Admitting: Nurse Practitioner

## 2020-08-26 ENCOUNTER — Other Ambulatory Visit: Payer: Self-pay

## 2020-08-26 ENCOUNTER — Encounter: Payer: Self-pay | Admitting: Nurse Practitioner

## 2020-08-26 VITALS — BP 140/88 | HR 54 | Temp 97.2°F | Ht 64.0 in | Wt 115.0 lb

## 2020-08-26 DIAGNOSIS — N898 Other specified noninflammatory disorders of vagina: Secondary | ICD-10-CM

## 2020-08-26 DIAGNOSIS — Z716 Tobacco abuse counseling: Secondary | ICD-10-CM

## 2020-08-26 DIAGNOSIS — Z1231 Encounter for screening mammogram for malignant neoplasm of breast: Secondary | ICD-10-CM

## 2020-08-26 DIAGNOSIS — N950 Postmenopausal bleeding: Secondary | ICD-10-CM | POA: Diagnosis not present

## 2020-08-26 DIAGNOSIS — Z13820 Encounter for screening for osteoporosis: Secondary | ICD-10-CM

## 2020-08-26 LAB — URINALYSIS, ROUTINE W REFLEX MICROSCOPIC
Bilirubin Urine: NEGATIVE
Glucose, UA: NEGATIVE
Hgb urine dipstick: NEGATIVE
Ketones, ur: NEGATIVE
Leukocytes,Ua: NEGATIVE
Nitrite: NEGATIVE
Specific Gravity, Urine: 1.028 (ref 1.001–1.03)
pH: 5.5 (ref 5.0–8.0)

## 2020-08-26 LAB — WET PREP FOR TRICH, YEAST, CLUE

## 2020-08-26 LAB — MICROSCOPIC MESSAGE

## 2020-08-26 NOTE — Progress Notes (Signed)
Subjective:    Patient ID: Brandi Rowe, female    DOB: Sep 15, 1955, 65 y.o.   MRN: [id number]  HPI: Brandi Rowe is a 65 y.o. female presenting for rectal discharge and vaginal bleeding.  Chief Complaint  Patient presents with  . Vaginal Discharge  . Rectal Problems    Discharge from both rectum and vagina for the past days.vaginal discharge has been green , brown and pink show. Having pain in the buttuck area as well. Took leftover amoxicillin. Not sure of last pap   CONSTIPATION/RECTAL PRESSURE Waxes and wanes between diarrhea and constipation, reports this is how her bowel movements have been for a "long time."  Recently, started feeling more rectal pressure and having more constipation.  Endorses hemorrhoids that she has never had before. Duration: months Pain: tat times Severity: 4/10 Aggravating factors: nothing Alleviating factors: having a BM History of similar: nothing Blood in stool: no; some on TP when she wipes Treatments attempted: preparation H and prune juice, metamucil  VAGINAL DISCHARGE Rectal discharge: clear, started 1 day before vaginal discharge started Duration: days Discharge description: clear gel from rectum; green/brown/pink from vagina  Pruritus: no Dysuria: no Malodorous: yes Urinary frequency: no  Night sweats: yes, 2 times past 2 weeks Fevers: no Abdominal pain: yes  Sexual activity: not sexually active History of sexually transmitted diseases: no Recent antibiotic use: amoxicillin past day  Context: better  Treatments attempted: took leftover amoxicillin   Allergies  Allergen Reactions  . Grass Extracts [Gramineae Pollens]     Dust, mold, mites  . Lipitor [Atorvastatin]     Myalgias     Outpatient Encounter Medications as of 08/26/2020  Medication Sig  . aspirin EC 81 MG tablet Take 81 mg by mouth daily.  . diazepam (VALIUM) 5 MG tablet Take 1 tablet (5 mg total) by mouth every 12 (twelve) hours as needed for anxiety.  .  diphenhydrAMINE (BENADRYL) 25 MG tablet Take 25 mg by mouth at bedtime.  . [DISCONTINUED] indomethacin (INDOCIN) 50 MG capsule Take 1 capsule (50 mg total) by mouth 3 (three) times daily with meals.   No facility-administered encounter medications on file as of 08/26/2020.    Patient Active Problem List   Diagnosis Date Noted  . Gout 05/09/2020  . Polycythemia 09/25/2018  . Current every day smoker 09/25/2018  . Anxiety disorder, unspecified 09/24/2018    History reviewed. No pertinent past medical history.  Relevant past medical, surgical, family and social history reviewed and updated as indicated. Interim medical history since our last visit reviewed.  Review of Systems  Constitutional: Positive for appetite change, diaphoresis and unexpected weight change. Negative for activity change and fever.  Gastrointestinal: Positive for abdominal pain, anal bleeding, constipation and diarrhea. Negative for abdominal distention, blood in stool, nausea, rectal pain and vomiting.  Genitourinary: Positive for dyspareunia, pelvic pain, vaginal bleeding, vaginal discharge and vaginal pain. Negative for decreased urine volume, difficulty urinating, dysuria, enuresis, flank pain, frequency, genital sores, hematuria, menstrual problem and urgency.  Neurological: Negative.   Hematological: Negative.  Negative for adenopathy. Does not bruise/bleed easily.  Psychiatric/Behavioral: Negative.     Per HPI unless specifically indicated above     Objective:    BP 140/88   Pulse (!) 54   Temp (!) 97.2 F (36.2 C)   Ht 5\' 4"  (1.626 m)   Wt 115 lb (52.2 kg)   SpO2 94%   BMI 19.74 kg/m   Wt Readings from Last 3 Encounters:  08/26/20  115 lb (52.2 kg)  05/06/20 121 lb (54.9 kg)  09/24/18 130 lb (59 kg)    Physical Exam Vitals and nursing note reviewed. Exam conducted with a chaperone present.  Constitutional:      General: She is not in acute distress.    Appearance: Normal appearance. She is not  toxic-appearing.  HENT:     Head: Normocephalic and atraumatic.  Abdominal:     General: Abdomen is flat.     Palpations: Abdomen is soft. There is no mass.     Hernia: No hernia is present. There is no hernia in the left inguinal area or right inguinal area.  Genitourinary:    General: Normal vulva.     Exam position: Lithotomy position.     Pubic Area: No rash or pubic lice.      Labia:        Right: No tenderness or lesion.        Left: No tenderness or lesion.      Vagina: Tenderness and bleeding present.     Rectum: Tenderness and external hemorrhoid present. Normal anal tone.     Comments: Unable to perform full examination with speculum due to severe pain at introitus.  No recto-vaginal fistula palpated with rectal examination. Lymphadenopathy:     Lower Body: No right inguinal adenopathy. No left inguinal adenopathy.  Skin:    General: Skin is warm and dry.     Coloration: Skin is not jaundiced or pale.     Findings: No erythema.  Neurological:     Mental Status: She is alert and oriented to person, place, and time.     Motor: No weakness.     Gait: Gait normal.  Psychiatric:        Mood and Affect: Mood normal.        Behavior: Behavior normal.        Thought Content: Thought content normal.        Judgment: Judgment normal.       Assessment & Plan:  1. Vaginal discharge Acute, ongoing x days.   Encouraged pap smear, however unable to obtain today.  Wet prep negative for clue cells or yeast.  Urinalysis negative, will send for culture.  Will obtain CBC to check for anemia along with CMP to check liver function and CA-125.  Given postmenopausal vaginal bleeding, unintentional weight loss over the last couple of months, and night sweats, will place urgent referral for GYN.  - WET PREP FOR TRICH, YEAST, CLUE - Urinalysis, Routine w reflex microscopic - Urine Culture - C. trachomatis/N. gonorrhoeae RNA - CBC with Differential - Comprehensive metabolic panel - CA  125  2. Postmenopausal vaginal bleeding Acute, ongoing x days.   Encouraged pap smear, however unable to obtain today.  Wet prep negative for clue cells or yeast.  Urinalysis negative, will send for culture.  Will obtain CBC to check for anemia along with CMP to check liver function and CA-125.  Given postmenopausal vaginal bleeding, unintentional weight loss over the last couple of months, and night sweats, will place urgent referral for GYN for further work up and evaluation.   - Ambulatory referral to Gynecology  3. Encounter for smoking cessation counseling Discussed smoking cessation with the patient; she is interested today.  Hotline information given for accountability and resources.   Follow up plan: Return if symptoms worsen or fail to improve.

## 2020-08-26 NOTE — Patient Instructions (Addendum)

## 2020-08-27 LAB — URINE CULTURE
MICRO NUMBER:: 11496876
Result:: NO GROWTH
SPECIMEN QUALITY:: ADEQUATE

## 2020-08-29 ENCOUNTER — Encounter: Payer: Medicare Other | Admitting: Nurse Practitioner

## 2020-08-29 LAB — COMPREHENSIVE METABOLIC PANEL
AG Ratio: 1.1 (calc) (ref 1.0–2.5)
ALT: 16 U/L (ref 6–29)
AST: 23 U/L (ref 10–35)
Albumin: 3.6 g/dL (ref 3.6–5.1)
Alkaline phosphatase (APISO): 160 U/L — ABNORMAL HIGH (ref 37–153)
BUN: 17 mg/dL (ref 7–25)
CO2: 27 mmol/L (ref 20–32)
Calcium: 9.6 mg/dL (ref 8.6–10.4)
Chloride: 98 mmol/L (ref 98–110)
Creat: 0.73 mg/dL (ref 0.50–0.99)
Globulin: 3.2 g/dL (calc) (ref 1.9–3.7)
Glucose, Bld: 95 mg/dL (ref 65–99)
Potassium: 4.8 mmol/L (ref 3.5–5.3)
Sodium: 137 mmol/L (ref 135–146)
Total Bilirubin: 0.3 mg/dL (ref 0.2–1.2)
Total Protein: 6.8 g/dL (ref 6.1–8.1)

## 2020-08-29 LAB — CBC WITH DIFFERENTIAL/PLATELET
Absolute Monocytes: 543 cells/uL (ref 200–950)
Basophils Absolute: 64 cells/uL (ref 0–200)
Basophils Relative: 0.7 %
Eosinophils Absolute: 184 cells/uL (ref 15–500)
Eosinophils Relative: 2 %
HCT: 45.3 % — ABNORMAL HIGH (ref 35.0–45.0)
Hemoglobin: 15.7 g/dL — ABNORMAL HIGH (ref 11.7–15.5)
Lymphs Abs: 1748 cells/uL (ref 850–3900)
MCH: 33.5 pg — ABNORMAL HIGH (ref 27.0–33.0)
MCHC: 34.7 g/dL (ref 32.0–36.0)
MCV: 96.6 fL (ref 80.0–100.0)
MPV: 10.4 fL (ref 7.5–12.5)
Monocytes Relative: 5.9 %
Neutro Abs: 6661 cells/uL (ref 1500–7800)
Neutrophils Relative %: 72.4 %
Platelets: 422 10*3/uL — ABNORMAL HIGH (ref 140–400)
RBC: 4.69 10*6/uL (ref 3.80–5.10)
RDW: 14.2 % (ref 11.0–15.0)
Total Lymphocyte: 19 %
WBC: 9.2 10*3/uL (ref 3.8–10.8)

## 2020-08-29 LAB — C. TRACHOMATIS/N. GONORRHOEAE RNA
C. trachomatis RNA, TMA: NOT DETECTED
N. gonorrhoeae RNA, TMA: NOT DETECTED

## 2020-08-29 LAB — CA 125: CA 125: 29 U/mL (ref <35)

## 2020-08-30 ENCOUNTER — Telehealth: Payer: Self-pay

## 2020-08-30 ENCOUNTER — Encounter: Payer: Self-pay | Admitting: Nurse Practitioner

## 2020-08-30 NOTE — Telephone Encounter (Signed)
Referrals for mammo and dexa  Have been placed for pt

## 2020-08-31 ENCOUNTER — Telehealth: Payer: Self-pay

## 2020-08-31 NOTE — Telephone Encounter (Signed)
Please reach out to the practice that is seeing her and inform them per her request.

## 2020-08-31 NOTE — Telephone Encounter (Signed)
Thank you :)

## 2020-09-05 ENCOUNTER — Ambulatory Visit (INDEPENDENT_AMBULATORY_CARE_PROVIDER_SITE_OTHER): Payer: Medicare Other | Admitting: Obstetrics & Gynecology

## 2020-09-05 ENCOUNTER — Other Ambulatory Visit (HOSPITAL_COMMUNITY)
Admission: RE | Admit: 2020-09-05 | Discharge: 2020-09-05 | Disposition: A | Discharge status: Home / Self Care | Payer: Medicare Other | Source: Ambulatory Visit | Attending: Obstetrics & Gynecology | Admitting: Obstetrics & Gynecology

## 2020-09-05 ENCOUNTER — Other Ambulatory Visit: Payer: Self-pay

## 2020-09-05 ENCOUNTER — Encounter: Payer: Self-pay | Admitting: Obstetrics & Gynecology

## 2020-09-05 VITALS — BP 170/90 | HR 84 | Ht 64.0 in | Wt 115.0 lb

## 2020-09-05 DIAGNOSIS — N952 Postmenopausal atrophic vaginitis: Secondary | ICD-10-CM | POA: Insufficient documentation

## 2020-09-05 NOTE — Addendum Note (Signed)
Addended by: Annamarie Dawley on: 09/05/2020 04:09 PM   Modules accepted: Orders

## 2020-09-05 NOTE — Progress Notes (Signed)
Chief Complaint  Patient presents with  . Vaginal Bleeding    Pt states it looks like blood mixed with discharge. Pink in color.       65 y.o. G0P0000 No LMP recorded. Patient is postmenopausal. The current method of family planning is post menopausal status.  Outpatient Encounter Medications as of 09/05/2020  Medication Sig  . aspirin EC 81 MG tablet Take 81 mg by mouth daily.  . diazepam (VALIUM) 5 MG tablet Take 1 tablet (5 mg total) by mouth every 12 (twelve) hours as needed for anxiety.  . diphenhydrAMINE (BENADRYL) 25 MG tablet Take 25 mg by mouth at bedtime.   No facility-administered encounter medications on file as of 09/05/2020.    Subjective Pt with very light vaginal spotting for the last 2 weeks, brown/pink/ never bright red No pain No sexual intercourse in a number of years No intra vaginal trauma with creams etc Since menopause 18 years ago no bleeding  History reviewed. No pertinent past medical history.  Past Surgical History:  Procedure Laterality Date  . CARPAL TUNNEL RELEASE Right 2002  . MANDIBLE SURGERY  1985    OB History    Gravida  0   Para  0   Term  0   Preterm  0   AB  0   Living  0     SAB  0   IAB  0   Ectopic  0   Multiple  0   Live Births  0           Allergies  Allergen Reactions  . Grass Extracts [Gramineae Pollens]     Dust, mold, mites  . Lipitor [Atorvastatin]     Myalgias     Social History   Socioeconomic History  . Marital status: Divorced    Spouse name: Not on file  . Number of children: 0  . Years of education: Not on file  . Highest education level: Some college, no degree  Occupational History    Employer: [employer]  Tobacco Use  . Smoking status: Current Every Day Smoker    Packs/day: 0.50    Types: Cigarettes  . Smokeless tobacco: Never Used  Vaping Use  . Vaping Use: Never used  Substance and Sexual Activity  . Alcohol use: Yes    Alcohol/week: 7.0 standard drinks     Types: 7 Glasses of wine per week  . Drug use: Never  . Sexual activity: Not Currently    Partners: Male  Other Topics Concern  . Not on file  Social History Narrative  . Not on file   Social Determinants of Health   Financial Resource Strain: Not on file  Food Insecurity: Not on file  Transportation Needs: Not on file  Physical Activity: Not on file  Stress: Not on file  Social Connections: Not on file    Family History  Problem Relation Age of Onset  . Congestive Heart Failure Mother   . Uterine cancer Mother   . Pancreatic cancer Father   . Atrial fibrillation Brother   . Stroke Maternal Grandmother   . Heart disease Maternal Grandmother   . Glaucoma Maternal Grandfather     Medications:       Current Outpatient Medications:  .  aspirin EC 81 MG tablet, Take 81 mg by mouth daily., Disp: , Rfl:  .  diazepam (VALIUM) 5 MG tablet, Take 1 tablet (5 mg total) by mouth every 12 (twelve) hours as needed  for anxiety., Disp: 20 tablet, Rfl: 0 .  diphenhydrAMINE (BENADRYL) 25 MG tablet, Take 25 mg by mouth at bedtime., Disp: , Rfl:   Objective Blood pressure (!) 170/90, pulse 84, height 5\' 4"  (1.626 m), weight 115 lb (52.2 kg).  General W[facility]N female NAD Vulva:  normal appearing  vulva with no masses, tenderness or lesions atrophic Vagina:  nomral mucosa, atrophic, small lesion at posterior fornix, cytology is done Cervix:  no cervical motion tenderness, no lesions and nulliparous appearance Uterus:  normal size, contour, position, consistency, mobility, non-tender Adnexa: ovaries:present,  normal adnexa in size, nontender and no masses, no masses   Pertinent ROS No burning with urination, frequency or urgency No nausea, vomiting or diarrhea Nor fever chills or other constitutional symptoms   Labs or studies No new    Impression Diagnoses this Encounter::   ICD-10-CM   1. Vaginal atrophy, small posterior fornix cervical junction lesions, very small  N95.2      Established relevant diagnosis(es):   Plan/Recommendations: No orders of the defined types were placed in this encounter.   Labs or Scans Ordered: No orders of the defined types were placed in this encounter.   Management:: Follow up cytology and proceed based on those findings  Follow up Return if symptoms worsen or fail to improve, for will send MyChart message with cytology findings.       All questions were answered.

## 2020-09-05 NOTE — Addendum Note (Signed)
Addended by: Annamarie Dawley on: 09/05/2020 03:59 PM   Modules accepted: Orders

## 2020-09-06 ENCOUNTER — Encounter: Payer: Self-pay | Admitting: Nurse Practitioner

## 2020-09-07 ENCOUNTER — Other Ambulatory Visit: Payer: Self-pay | Admitting: Family Medicine

## 2020-09-07 NOTE — Telephone Encounter (Signed)
Ok to refill??  Last office visit/ refill 05/06/2020.

## 2020-09-08 LAB — CYTOLOGY - PAP
Comment: NEGATIVE
Diagnosis: NEGATIVE
High risk HPV: NEGATIVE

## 2020-09-14 ENCOUNTER — Telehealth: Payer: Self-pay | Admitting: Obstetrics & Gynecology

## 2020-09-14 NOTE — Telephone Encounter (Signed)
Pt states the discharge is no better, pink to brown in color, amount is more, mild pelvic pain off & on  Thinks she may need ultrasound or to be seen again   Please advise & call pt

## 2020-09-15 NOTE — Telephone Encounter (Signed)
Called pt to inform her of Dr. Forestine Chute advice. She states that she would like to go ahead and start some estrogen cream as soon as possible. Will call pt after prescription is sent to pharmacy.

## 2020-09-15 NOTE — Telephone Encounter (Signed)
Work up is negative  This is due to vaginal atrophy, she may consider vaginal estrogen cream as a solution  She can re contact me next week if she would like to do that  Otherwise everything is normal

## 2020-09-16 ENCOUNTER — Other Ambulatory Visit: Payer: Self-pay | Admitting: Obstetrics & Gynecology

## 2020-09-16 DIAGNOSIS — N952 Postmenopausal atrophic vaginitis: Secondary | ICD-10-CM

## 2020-09-16 MED ORDER — ESTRADIOL 10 MCG VA TABS: 1.0000 | ORAL_TABLET | Freq: Every day | VAGINAL | 5 refills | Status: DC

## 2020-09-16 NOTE — Telephone Encounter (Signed)
Called pt to inform her of prescription sent to pharmacy. Pt confirmed understanding.

## 2020-09-16 NOTE — Telephone Encounter (Signed)
Her insurance prefers the vagifem vaginal tablets it appears, so that was e prescribed to her pharmacy

## 2020-09-19 ENCOUNTER — Telehealth: Payer: Self-pay | Admitting: Obstetrics & Gynecology

## 2020-09-19 NOTE — Telephone Encounter (Signed)
Patient called stating that she called on Friday and got a medication called into the pharmacy but they informed her that she needs [place] to get prior auth in order to get this medication. Patient would like for [place] to contact the pharmacy which is [facility] on [address] and get the information so we could get the prior auth on the medication. Please contact pt

## 2020-09-19 NOTE — Telephone Encounter (Signed)
Pt states that the Rx we ordered will be $395.00 out of pocket for her & she can't afford that, wonders if there's an alternative we can order  Please advise & notify pt   [address]

## 2020-09-20 ENCOUNTER — Other Ambulatory Visit: Payer: Self-pay | Admitting: Obstetrics & Gynecology

## 2020-09-20 MED ORDER — ESTRADIOL 0.1 MG/GM VA CREA: TOPICAL_CREAM | VAGINAL | 12 refills | Status: DC

## 2020-09-20 NOTE — Telephone Encounter (Signed)
I e prescribed estrace vaginal cream as an alternative

## 2020-09-20 NOTE — Telephone Encounter (Signed)
Called pt to inform her of new script sent to pharmacy. Pt will call if she has any issues with insurance for this med.

## 2020-09-20 NOTE — Telephone Encounter (Signed)
Returned pt's call to inform her that her insurance will only cover 18 pills per 30 days. A msg was sent to Dr Despina Hidden for an alternative or PA request. Will call pt with answer asap.

## 2020-10-20 ENCOUNTER — Encounter: Payer: Self-pay | Admitting: Nurse Practitioner

## 2020-10-21 ENCOUNTER — Other Ambulatory Visit: Payer: Self-pay

## 2020-10-21 ENCOUNTER — Ambulatory Visit (INDEPENDENT_AMBULATORY_CARE_PROVIDER_SITE_OTHER): Payer: Medicare Other | Admitting: Nurse Practitioner

## 2020-10-21 ENCOUNTER — Encounter: Payer: Self-pay | Admitting: Nurse Practitioner

## 2020-10-21 VITALS — BP 120/60 | HR 111 | Temp 97.1°F | Wt 112.4 lb

## 2020-10-21 DIAGNOSIS — R300 Dysuria: Secondary | ICD-10-CM | POA: Diagnosis not present

## 2020-10-21 DIAGNOSIS — Z716 Tobacco abuse counseling: Secondary | ICD-10-CM

## 2020-10-21 DIAGNOSIS — R634 Abnormal weight loss: Secondary | ICD-10-CM

## 2020-10-21 LAB — URINALYSIS, ROUTINE W REFLEX MICROSCOPIC
Nitrite: NEGATIVE
Specific Gravity, Urine: 1.027 (ref 1.001–1.03)
pH: 5.5 (ref 5.0–8.0)

## 2020-10-21 LAB — MICROSCOPIC MESSAGE

## 2020-10-21 MED ORDER — CEPHALEXIN 500 MG PO CAPS: 500.0000 mg | ORAL_CAPSULE | Freq: Four times a day (QID) | ORAL | 0 refills | Status: AC

## 2020-10-21 NOTE — Progress Notes (Signed)
Subjective:    Patient ID: Brandi Rowe, female    DOB: 1956-06-10, 65 y.o.   MRN: [id number]  HPI: Brandi Rowe is a 65 y.o. female presenting for painful urination  Chief Complaint  Patient presents with  . Urinary Tract Infection   URINARY SYMPTOMS Duration: days Dysuria: burning Urinary frequency: yes Urgency: yes Small volume voids: yes Symptom severity: moderate Urinary incontinence: no Foul odor: no Hematuria: no Abdominal pain: no Back pain: no Suprapubic pain/pressure: no Flank pain: no Fever:  Yes; subjective Nausea: no Vomiting: no Relief with cranberry juice: no Relief with pyridium: no Status: fluctuating Previous urinary tract infection: yes Recurrent urinary tract infection: no Sexual activity: Not sexually active Vagina discharge: pink, brown Treatments attempted: clindamycin, increasing water, cranberry juice  WEIGHT LOSS Also reports she is steadily losing weight.  She has not changed what she is eating.  She is not taking any new medications.  She eats burgers, fries, ice cream, chicken.  She has declined screening for colon cancer in the past and recently had a pap smear that was negative for intraepithelial lesion.  At the time, she hasd a small posterior fornix cervical junction lesion that was thought to be the cause of the bleeding.  She is still having some vaginal bleeding which is being treated with vaginal estrace per OB/GYN. She is a current smoker and is trying to quit.   Duration: months Amount of weight loss:  09/24/2018 - 130 lbs 05/06/2020 - 121 lbs 08/26/2020 - 115 lbs 10/21/2020 - 112 lbs Fevers: no Decreased appetite: no Night sweats: no Dysphagia/odynophagia: no Chest pain: no Shortness of breath: no Cough: no Nausea: no Vomiting: no Abdominal pain: no Blood in stool: no Easy bruising/bleeding: no Jaundice: no Polydipsia/polyuria: no Depression: no Previous colonoscopy: no  Current Smoker: yes; ~23 pack year  smoking history  Allergies  Allergen Reactions  . Grass Extracts [Gramineae Pollens]     Dust, mold, mites  . Lipitor [Atorvastatin]     Myalgias     Outpatient Encounter Medications as of 10/21/2020  Medication Sig  . cephALEXin (KEFLEX) 500 MG capsule Take 1 capsule (500 mg total) by mouth 4 (four) times daily for 5 days.  Marland Kitchen aspirin EC 81 MG tablet Take 81 mg by mouth daily.  . diazepam (VALIUM) 5 MG tablet TAKE 1 TABLET BY MOUTH EVERY 12 HOURS AS NEEDED FOR ANXIETY  . diphenhydrAMINE (BENADRYL) 25 MG tablet Take 25 mg by mouth at bedtime.  Marland Kitchen estradiol (ESTRACE) 0.1 MG/GM vaginal cream 1 gram 3 times weekly  . Estradiol 10 MCG TABS vaginal tablet Place 1 tablet (10 mcg total) vaginally at bedtime. After the first month use 3 nights a week   No facility-administered encounter medications on file as of 10/21/2020.    Patient Active Problem List   Diagnosis Date Noted  . Gout 05/09/2020  . Polycythemia 09/25/2018  . Current every day smoker 09/25/2018  . Anxiety disorder, unspecified 09/24/2018    No past medical history on file.  Relevant past medical, surgical, family and social history reviewed and updated as indicated. Interim medical history since our last visit reviewed.  Review of Systems Per HPI unless specifically indicated above     Objective:    BP 120/60   Pulse (!) 111   Temp (!) 97.1 F (36.2 C)   Wt 112 lb 6.4 oz (51 kg)   SpO2 95%   BMI 19.29 kg/m   Wt Readings from Last 3 Encounters:  10/21/20 112 lb 6.4 oz (51 kg)  09/05/20 115 lb (52.2 kg)  08/26/20 115 lb (52.2 kg)    Physical Exam Vitals and nursing note reviewed.  Constitutional:      General: She is not in acute distress.    Appearance: She is not toxic-appearing.  Abdominal:     General: Abdomen is flat. Bowel sounds are normal. There is no distension.     Palpations: There is no mass.     Tenderness: There is no abdominal tenderness. There is no right CVA tenderness, left CVA tenderness  or guarding.  Genitourinary:    Comments: Deferred using shared decision making Skin:    General: Skin is warm and dry.     Coloration: Skin is not jaundiced or pale.     Findings: No erythema.  Neurological:     Mental Status: She is alert and oriented to person, place, and time.     Gait: Gait normal.  Psychiatric:        Mood and Affect: Mood normal.        Behavior: Behavior normal.        Thought Content: Thought content normal.        Judgment: Judgment normal.     Results for orders placed or performed in visit on 10/21/20  Urinalysis, Routine w reflex microscopic  Result Value Ref Range   Color, Urine YELLOW YELLOW   APPearance CLOUDY (A) CLEAR   Specific Gravity, Urine 1.027 1.001 - 1.03   pH 5.5 5.0 - 8.0   Glucose, UA TRACE (A) NEGATIVE   Bilirubin Urine 2+ (A) NEGATIVE   Ketones, ur TRACE (A) NEGATIVE   Hgb urine dipstick 3+ (A) NEGATIVE   Protein, ur 3+ (A) NEGATIVE   Nitrite NEGATIVE NEGATIVE   Leukocytes,Ua 1+ (A) NEGATIVE   WBC, UA CANCELED    Urine-Other    Microscopic Message  Result Value Ref Range   Note        Assessment & Plan:  1. Dysuria Acute.  UA dipstick showed trace amount of glucose, trace ketones, 3+ blood, 3+ protein, and 1+ leukocyte esterase.  Unable to perform microscopic due to insufficient quantity.  Given severity of symptoms, will treat with cephalexin 500 mg four times daily for 5 days and send urine for culture and sensitivity.    - Urinalysis, Routine w reflex microscopic - cephALEXin (KEFLEX) 500 MG capsule; Take 1 capsule (500 mg total) by mouth 4 (four) times daily for 5 days.  Dispense: 20 capsule; Refill: 0 - Urine Culture  2. Encounter for smoking cessation counseling At least 23 pack year smoking history.  Discussed low dose CT screening for high risk individuals including smokers.  Patient is interested in screening today - will place referral to lung cancer screening clinic.  - Ambulatory Referral for Lung Cancer  Scre  3. Weight loss Ongoing since establishing care with [place] in 2020.  She denies all B symptoms.  She has never had colon cancer screening and does not desire today.  Recent pap smear was negative.  Mammogram and bone density scans have been ordered.  Referral placed for lung cancer screening.  Recent blood work was normal with exception of polycythemica, likely due to chronic hypoxia from smoking.  Encouraged eating meals focused in protein, limit ice cream and sugary foods.  Recheck weight in 1 month.   Follow up plan: Return for pending urine culture results.

## 2020-10-23 LAB — URINE CULTURE
MICRO NUMBER:: 11721152
SPECIMEN QUALITY:: ADEQUATE

## 2020-10-27 ENCOUNTER — Telehealth: Payer: Self-pay | Admitting: *Deleted

## 2020-10-27 NOTE — Telephone Encounter (Signed)
Received referral for low dose lung cancer screening CT scan. Message left at both phone numbers listed in EMR for patient to call me back to facilitate scheduling scan.

## 2020-11-04 ENCOUNTER — Telehealth: Payer: Self-pay | Admitting: *Deleted

## 2020-11-04 NOTE — Telephone Encounter (Signed)
Received referral for low dose lung cancer screening CT scan. Message left at phone number listed in EMR for patient to call me back to facilitate scheduling scan.  

## 2020-11-07 ENCOUNTER — Telehealth: Payer: Self-pay | Admitting: *Deleted

## 2020-11-07 NOTE — Telephone Encounter (Signed)
Received referral for low dose lung cancer screening CT scan. Message left at both phone numbers listed in EMR for patient to call me back to facilitate scheduling scan.  

## 2020-11-09 ENCOUNTER — Encounter: Payer: Self-pay | Admitting: *Deleted

## 2020-11-10 ENCOUNTER — Other Ambulatory Visit: Payer: Self-pay

## 2020-11-10 ENCOUNTER — Encounter: Payer: Self-pay | Admitting: Nurse Practitioner

## 2020-11-10 ENCOUNTER — Ambulatory Visit (INDEPENDENT_AMBULATORY_CARE_PROVIDER_SITE_OTHER): Payer: Medicare Other | Admitting: Nurse Practitioner

## 2020-11-10 VITALS — BP 154/98 | HR 109 | Temp 97.9°F | Ht 64.0 in | Wt 114.0 lb

## 2020-11-10 DIAGNOSIS — R300 Dysuria: Secondary | ICD-10-CM

## 2020-11-10 DIAGNOSIS — R030 Elevated blood-pressure reading, without diagnosis of hypertension: Secondary | ICD-10-CM | POA: Diagnosis not present

## 2020-11-10 LAB — URINALYSIS, ROUTINE W REFLEX MICROSCOPIC
Bilirubin Urine: NEGATIVE
Glucose, UA: NEGATIVE
Hyaline Cast: NONE SEEN /LPF
Ketones, ur: NEGATIVE
Nitrite: NEGATIVE
Specific Gravity, Urine: 1.01 (ref 1.001–1.03)
WBC, UA: >=60 /HPF — AB (ref 0–5)
pH: 6 (ref 5.0–8.0)

## 2020-11-10 LAB — MICROSCOPIC MESSAGE

## 2020-11-10 MED ORDER — FLUCONAZOLE 150 MG PO TABS: 150.0000 mg | ORAL_TABLET | Freq: Once | ORAL | 0 refills | Status: AC

## 2020-11-10 MED ORDER — SULFAMETHOXAZOLE-TRIMETHOPRIM 800-160 MG PO TABS: 1.0000 | ORAL_TABLET | Freq: Two times a day (BID) | ORAL | 0 refills | Status: DC

## 2020-11-10 NOTE — Progress Notes (Signed)
Subjective:    Patient ID: Brandi Rowe, female    DOB: 02-Jul-1956, 65 y.o.   MRN: [id number]  HPI: Brandi Rowe is a 65 y.o. female presenting for persistent UTI symptoms.    Chief Complaint  Patient presents with  . Follow-up    Still having urine problem, has pain when urinating.    URINARY SYMPTOMS Mostly after BM in the am.  Antibiotics helped a little after last visit. Duration: Weeks Dysuria: no Urinary frequency: no Urgency: yes Small volume voids: yes Symptom severity: moderate Urinary incontinence: no Foul odor: yes Hematuria: no Abdominal pain: yes Back pain: no Suprapubic pain/pressure: no Flank pain: no Fever:  No Nausea: no Vomiting: no Relief with cranberry juice: no Relief with pyridium: no Status: stable Previous urinary tract infection: yes Recurrent urinary tract infection: no Sexual activity: not currently sexually active History of sexually transmitted disease: no Vaginal discharge: no Treatments attempted: no   Allergies  Allergen Reactions  . Grass Extracts [Gramineae Pollens]     Dust, mold, mites  . Lipitor [Atorvastatin]     Myalgias     Outpatient Encounter Medications as of 11/10/2020  Medication Sig  . aspirin EC 81 MG tablet Take 81 mg by mouth daily.  . diazepam (VALIUM) 5 MG tablet TAKE 1 TABLET BY MOUTH EVERY 12 HOURS AS NEEDED FOR ANXIETY  . diphenhydrAMINE (BENADRYL) 25 MG tablet Take 25 mg by mouth at bedtime.  Marland Kitchen estradiol (ESTRACE) 0.1 MG/GM vaginal cream 1 gram 3 times weekly  . Estradiol 10 MCG TABS vaginal tablet Place 1 tablet (10 mcg total) vaginally at bedtime. After the first month use 3 nights a week  . fluconazole (DIFLUCAN) 150 MG tablet Take 1 tablet (150 mg total) by mouth once for 1 dose.  . sulfamethoxazole-trimethoprim (BACTRIM DS) 800-160 MG tablet Take 1 tablet by mouth 2 (two) times daily.   No facility-administered encounter medications on file as of 11/10/2020.    Patient Active Problem List    Diagnosis Date Noted  . Gout 05/09/2020  . Polycythemia 09/25/2018  . Current every day smoker 09/25/2018  . Anxiety disorder, unspecified 09/24/2018    History reviewed. No pertinent past medical history.  Relevant past medical, surgical, family and social history reviewed and updated as indicated. Interim medical history since our last visit reviewed.  Review of Systems Per HPI unless specifically indicated above     Objective:    BP (!) 154/98   Pulse (!) 109   Temp 97.9 F (36.6 C)   Ht 5\' 4"  (1.626 m)   Wt 114 lb (51.7 kg)   SpO2 97%   BMI 19.57 kg/m   Wt Readings from Last 3 Encounters:  11/10/20 114 lb (51.7 kg)  10/21/20 112 lb 6.4 oz (51 kg)  09/05/20 115 lb (52.2 kg)    Physical Exam Vitals and nursing note reviewed.  Constitutional:      General: She is not in acute distress.    Appearance: She is not toxic-appearing.  Abdominal:     General: Abdomen is flat. Bowel sounds are normal. There is no distension.     Palpations: There is no mass.     Tenderness: There is no abdominal tenderness. There is no right CVA tenderness, left CVA tenderness or guarding.  Genitourinary:    Comments: Deferred using shared decision making Skin:    General: Skin is warm and dry.     Coloration: Skin is not jaundiced or pale.  Findings: No erythema.  Neurological:     Mental Status: She is alert and oriented to person, place, and time.     Gait: Gait normal.  Psychiatric:        Mood and Affect: Mood normal.        Behavior: Behavior normal.        Thought Content: Thought content normal.        Judgment: Judgment normal.       Assessment & Plan:  1. Dysuria Acute.  Treated with cephalexin 500 mg qid x 5 days about 3 weeks ago without improvement.  Urine culture at that time showed sensitivity to cephalexin.  Today, urine dipstick shows 3+ blood, trace of protein, and 2+ leukocyte esterase.  Microscopic exam reveals greater than 60 white blood cells, and a few  bacteria.  We will treat patient with Bactrim DS twice daily for 5 days.  Urine culture will be sent off.  One-time dose of fluconazole also given if yeast infection present after finishing course of antibiotics.   - Urinalysis, Routine w reflex microscopic  2. Elevated blood-pressure reading without diagnosis of hypertension Patient reports she is nervous today.  Advised to check BP a few times weekly at home and notify us with readings >130/80.  Discussed that long term uncontrolled hypertension can lead to cardiomyopathy and we want to prevent this from happening.  Patient verbalizes understanding.    Follow up plan: Return if symptoms worsen or fail to improve.

## 2020-11-11 LAB — URINE CULTURE
MICRO NUMBER:: 11797704
SPECIMEN QUALITY:: ADEQUATE

## 2021-01-04 ENCOUNTER — Encounter (HOSPITAL_COMMUNITY): Payer: Self-pay

## 2021-01-04 ENCOUNTER — Other Ambulatory Visit: Payer: Self-pay

## 2021-01-04 ENCOUNTER — Emergency Department (HOSPITAL_COMMUNITY): Payer: Medicare Other

## 2021-01-04 ENCOUNTER — Observation Stay (HOSPITAL_COMMUNITY)
Admission: EM | Admit: 2021-01-04 | Discharge: 2021-01-05 | Disposition: A | Discharge status: Home / Self Care | Payer: Medicare Other | Attending: Internal Medicine | Admitting: Internal Medicine

## 2021-01-04 DIAGNOSIS — R1032 Left lower quadrant pain: Secondary | ICD-10-CM | POA: Diagnosis not present

## 2021-01-04 DIAGNOSIS — R718 Other abnormality of red blood cells: Secondary | ICD-10-CM | POA: Diagnosis not present

## 2021-01-04 DIAGNOSIS — E860 Dehydration: Secondary | ICD-10-CM

## 2021-01-04 DIAGNOSIS — R509 Fever, unspecified: Secondary | ICD-10-CM | POA: Diagnosis not present

## 2021-01-04 DIAGNOSIS — J449 Chronic obstructive pulmonary disease, unspecified: Secondary | ICD-10-CM | POA: Diagnosis not present

## 2021-01-04 DIAGNOSIS — R110 Nausea: Secondary | ICD-10-CM

## 2021-01-04 DIAGNOSIS — K5732 Diverticulitis of large intestine without perforation or abscess without bleeding: Secondary | ICD-10-CM | POA: Diagnosis not present

## 2021-01-04 DIAGNOSIS — F419 Anxiety disorder, unspecified: Secondary | ICD-10-CM | POA: Diagnosis present

## 2021-01-04 DIAGNOSIS — F17210 Nicotine dependence, cigarettes, uncomplicated: Secondary | ICD-10-CM | POA: Insufficient documentation

## 2021-01-04 DIAGNOSIS — R197 Diarrhea, unspecified: Secondary | ICD-10-CM | POA: Diagnosis not present

## 2021-01-04 DIAGNOSIS — A419 Sepsis, unspecified organism: Secondary | ICD-10-CM | POA: Diagnosis present

## 2021-01-04 DIAGNOSIS — Z20822 Contact with and (suspected) exposure to covid-19: Secondary | ICD-10-CM | POA: Diagnosis not present

## 2021-01-04 DIAGNOSIS — K5720 Diverticulitis of large intestine with perforation and abscess without bleeding: Secondary | ICD-10-CM

## 2021-01-04 DIAGNOSIS — Z7982 Long term (current) use of aspirin: Secondary | ICD-10-CM | POA: Insufficient documentation

## 2021-01-04 DIAGNOSIS — R109 Unspecified abdominal pain: Secondary | ICD-10-CM

## 2021-01-04 HISTORY — DX: Nausea: R11.0

## 2021-01-04 HISTORY — DX: Other abnormality of red blood cells: R71.8

## 2021-01-04 HISTORY — DX: Fever, unspecified: R50.9

## 2021-01-04 HISTORY — DX: Dehydration: E86.0

## 2021-01-04 LAB — CBC WITH DIFFERENTIAL/PLATELET
Abs Immature Granulocytes: 0.02 10*3/uL (ref 0.00–0.07)
Basophils Absolute: 0 10*3/uL (ref 0.0–0.1)
Basophils Relative: 1 %
Eosinophils Absolute: 0 10*3/uL (ref 0.0–0.5)
Eosinophils Relative: 0 %
HCT: 49.7 % — ABNORMAL HIGH (ref 36.0–46.0)
Hemoglobin: 17.3 g/dL — ABNORMAL HIGH (ref 12.0–15.0)
Immature Granulocytes: 0 %
Lymphocytes Relative: 4 %
Lymphs Abs: 0.2 10*3/uL — ABNORMAL LOW (ref 0.7–4.0)
MCH: 35.5 pg — ABNORMAL HIGH (ref 26.0–34.0)
MCHC: 34.8 g/dL (ref 30.0–36.0)
MCV: 102.1 fL — ABNORMAL HIGH (ref 80.0–100.0)
Monocytes Absolute: 0.1 10*3/uL (ref 0.1–1.0)
Monocytes Relative: 1 %
Neutro Abs: 5.8 10*3/uL (ref 1.7–7.7)
Neutrophils Relative %: 94 %
Platelets: 242 10*3/uL (ref 150–400)
RBC: 4.87 MIL/uL (ref 3.87–5.11)
RDW: 15.6 % — ABNORMAL HIGH (ref 11.5–15.5)
WBC: 6.1 10*3/uL (ref 4.0–10.5)
nRBC: 0 % (ref 0.0–0.2)

## 2021-01-04 LAB — URINALYSIS, ROUTINE W REFLEX MICROSCOPIC
Bilirubin Urine: NEGATIVE
Glucose, UA: NEGATIVE mg/dL
Ketones, ur: 20 mg/dL — AB
Nitrite: NEGATIVE
Protein, ur: 30 mg/dL — AB
RBC / HPF: >50 RBC/hpf — ABNORMAL HIGH (ref 0–5)
Specific Gravity, Urine: 1.014 (ref 1.005–1.030)
pH: 5 (ref 5.0–8.0)

## 2021-01-04 LAB — COMPREHENSIVE METABOLIC PANEL
ALT: 14 U/L (ref 0–44)
AST: 20 U/L (ref 15–41)
Albumin: 3.8 g/dL (ref 3.5–5.0)
Alkaline Phosphatase: 118 U/L (ref 38–126)
Anion gap: 13 (ref 5–15)
BUN: 16 mg/dL (ref 8–23)
CO2: 21 mmol/L — ABNORMAL LOW (ref 22–32)
Calcium: 8.9 mg/dL (ref 8.9–10.3)
Chloride: 100 mmol/L (ref 98–111)
Creatinine, Ser: 0.66 mg/dL (ref 0.44–1.00)
GFR, Estimated: >60 mL/min (ref >60)
Glucose, Bld: 93 mg/dL (ref 70–99)
Potassium: 3.7 mmol/L (ref 3.5–5.1)
Sodium: 134 mmol/L — ABNORMAL LOW (ref 135–145)
Total Bilirubin: 1.2 mg/dL (ref 0.3–1.2)
Total Protein: 7.1 g/dL (ref 6.5–8.1)

## 2021-01-04 LAB — RESP PANEL BY RT-PCR (FLU A&B, COVID) ARPGX2
Influenza A by PCR: NEGATIVE
Influenza B by PCR: NEGATIVE
SARS Coronavirus 2 by RT PCR: NEGATIVE

## 2021-01-04 LAB — LACTIC ACID, PLASMA
Lactic Acid, Venous: 1.4 mmol/L (ref 0.5–1.9)
Lactic Acid, Venous: 0.9 mmol/L (ref 0.5–1.9)

## 2021-01-04 MED ORDER — PIPERACILLIN-TAZOBACTAM 3.375 G IVPB 30 MIN
3.3750 g | Freq: Once | INTRAVENOUS | Status: AC
Start: 2021-01-04 — End: 2021-01-04
  Administered 2021-01-04: 3.375 g via INTRAVENOUS
  Filled 2021-01-04: qty 50

## 2021-01-04 MED ORDER — SODIUM CHLORIDE 0.9 % IV BOLUS
1000.0000 mL | Freq: Once | INTRAVENOUS | Status: AC
  Administered 2021-01-04: 1000 mL via INTRAVENOUS

## 2021-01-04 MED ORDER — ONDANSETRON HCL 4 MG PO TABS: 4.0000 mg | ORAL_TABLET | Freq: Four times a day (QID) | ORAL | Status: DC | PRN

## 2021-01-04 MED ORDER — FLUCONAZOLE 150 MG PO TABS
150.0000 mg | ORAL_TABLET | Freq: Once | ORAL | Status: AC
  Administered 2021-01-04: 150 mg via ORAL
  Filled 2021-01-04: qty 1

## 2021-01-04 MED ORDER — SODIUM CHLORIDE 0.9 % IV SOLN: Freq: Once | INTRAVENOUS | Status: AC

## 2021-01-04 MED ORDER — ONDANSETRON HCL 4 MG/2ML IJ SOLN
4.0000 mg | Freq: Once | INTRAMUSCULAR | Status: AC
  Administered 2021-01-04: 4 mg via INTRAVENOUS
  Filled 2021-01-04: qty 2

## 2021-01-04 MED ORDER — IBUPROFEN 400 MG PO TABS
600.0000 mg | ORAL_TABLET | Freq: Once | ORAL | Status: AC
  Administered 2021-01-04: 600 mg via ORAL
  Filled 2021-01-04: qty 2

## 2021-01-04 MED ORDER — ACETAMINOPHEN 500 MG PO TABS
1000.0000 mg | ORAL_TABLET | Freq: Once | ORAL | Status: AC
  Administered 2021-01-04: 1000 mg via ORAL
  Filled 2021-01-04: qty 2

## 2021-01-04 MED ORDER — ACETAMINOPHEN 650 MG RE SUPP: 650.0000 mg | Freq: Four times a day (QID) | RECTAL | Status: DC | PRN

## 2021-01-04 MED ORDER — ONDANSETRON HCL 4 MG/2ML IJ SOLN: 4.0000 mg | Freq: Four times a day (QID) | INTRAMUSCULAR | Status: DC | PRN

## 2021-01-04 MED ORDER — IOHEXOL 300 MG/ML  SOLN
100.0000 mL | Freq: Once | INTRAMUSCULAR | Status: AC | PRN
  Administered 2021-01-04: 100 mL via INTRAVENOUS

## 2021-01-04 MED ORDER — MORPHINE SULFATE (PF) 2 MG/ML IV SOLN: 2.0000 mg | INTRAVENOUS | Status: DC | PRN

## 2021-01-04 MED ORDER — ACETAMINOPHEN 325 MG PO TABS
650.0000 mg | ORAL_TABLET | Freq: Four times a day (QID) | ORAL | Status: DC | PRN
  Administered 2021-01-05: 650 mg via ORAL
  Filled 2021-01-04 (×2): qty 2

## 2021-01-04 MED ORDER — PIPERACILLIN-TAZOBACTAM 3.375 G IVPB
3.3750 g | Freq: Three times a day (TID) | INTRAVENOUS | Status: DC
  Administered 2021-01-05: 3.375 g via INTRAVENOUS
  Filled 2021-01-04: qty 50

## 2021-01-04 MED ORDER — SODIUM CHLORIDE 0.9 % IV SOLN
1.0000 g | Freq: Once | INTRAVENOUS | Status: AC
  Administered 2021-01-04: 1 g via INTRAVENOUS
  Filled 2021-01-04: qty 10

## 2021-01-04 NOTE — ED Provider Notes (Signed)
[facility] EMERGENCY DEPARTMENT Provider Note   CSN: [id number] Arrival date & time: 01/04/21  1325     History Chief Complaint  Patient presents with   Chills    Brandi Rowe is a 65 y.o. female.  Pt presents to the ED today with chills, diarrhea, and nausea.  Pt said she woke up not feeling well.  She has had some diarrhea and developed some chills and fever associated with dry heaves.  She denies sob or cough.  No recent abx.  No recent travel.  No known covid contacts.  + vaccine and booster.  Pt feels very thirsty.  Pt last took tylenol around 0800.      History reviewed. No pertinent past medical history.  Patient Active Problem List   Diagnosis Date Noted   Gout 05/09/2020   Polycythemia 09/25/2018   Current every day smoker 09/25/2018   Anxiety disorder, unspecified 09/24/2018    Past Surgical History:  Procedure Laterality Date   CARPAL TUNNEL RELEASE Right 2002   MANDIBLE SURGERY  1985     OB History     Gravida  0   Para  0   Term  0   Preterm  0   AB  0   Living  0      SAB  0   IAB  0   Ectopic  0   Multiple  0   Live Births  0           Family History  Problem Relation Age of Onset   Congestive Heart Failure Mother    Uterine cancer Mother    Pancreatic cancer Father    Atrial fibrillation Brother    Stroke Maternal Grandmother    Heart disease Maternal Grandmother    Glaucoma Maternal Grandfather     Social History   Tobacco Use   Smoking status: Every Day    Packs/day: 0.50    Years: 46.00    Pack years: 23.00    Types: Cigarettes   Smokeless tobacco: Never  Vaping Use   Vaping Use: Never used  Substance Use Topics   Alcohol use: Yes    Alcohol/week: 7.0 standard drinks    Types: 7 Glasses of wine per week   Drug use: Never    Home Medications Prior to Admission medications   Medication Sig Start Date End Date Taking? Authorizing Provider  aspirin EC 81 MG tablet Take 81 mg by mouth daily.     [provider]  diazepam (VALIUM) 5 MG tablet TAKE 1 TABLET BY MOUTH EVERY 12 HOURS AS NEEDED FOR ANXIETY 09/07/20   Salley Scarleturham, Kawanta F, MD  diphenhydrAMINE (BENADRYL) 25 MG tablet Take 25 mg by mouth at bedtime.    [provider]  estradiol (ESTRACE) 0.1 MG/GM vaginal cream 1 gram 3 times weekly 09/20/20   Lazaro ArmsEure, Luther H, MD  Estradiol 10 MCG TABS vaginal tablet Place 1 tablet (10 mcg total) vaginally at bedtime. After the first month use 3 nights a week 09/16/20 03/15/21  Lazaro ArmsEure, Luther H, MD  sulfamethoxazole-trimethoprim (BACTRIM DS) 800-160 MG tablet Take 1 tablet by mouth 2 (two) times daily. 11/10/20   Valentino NoseMartinez, Jessica A, NP    Allergies    Grass extracts [gramineae pollens] and Lipitor [atorvastatin]  Review of Systems   Review of Systems  Constitutional:  Positive for chills and fever.  Gastrointestinal:  Positive for diarrhea and nausea.  All other systems reviewed and are negative.  Physical Exam Updated Vital  Signs BP 139/81   Pulse (!) 110   Temp (!) 101.6 F (38.7 C) (Oral)   Resp (!) 31   Ht 5\' 4"  (1.626 m)   Wt 50.8 kg   SpO2 98%   BMI 19.22 kg/m   Physical Exam Vitals and nursing note reviewed.  Constitutional:      Appearance: Normal appearance.  HENT:     Head: Normocephalic and atraumatic.     Right Ear: External ear normal.     Left Ear: External ear normal.     Nose: Nose normal.     Mouth/Throat:     Mouth: Mucous membranes are dry.  Eyes:     Extraocular Movements: Extraocular movements intact.     Conjunctiva/sclera: Conjunctivae normal.     Pupils: Pupils are equal, round, and reactive to light.  Cardiovascular:     Rate and Rhythm: Regular rhythm. Tachycardia present.     Pulses: Normal pulses.     Heart sounds: Normal heart sounds.  Pulmonary:     Effort: Pulmonary effort is normal.     Breath sounds: Normal breath sounds.  Abdominal:     General: Abdomen is flat. Bowel sounds are normal.     Palpations: Abdomen is soft.   Musculoskeletal:        General: Normal range of motion.     Cervical back: Normal range of motion and neck supple.  Skin:    General: Skin is warm.     Capillary Refill: Capillary refill takes less than 2 seconds.  Neurological:     General: No focal deficit present.     Mental Status: She is alert and oriented to person, place, and time.  Psychiatric:        Mood and Affect: Mood normal.        Behavior: Behavior normal.        Thought Content: Thought content normal.        Judgment: Judgment normal.    ED Results / Procedures / Treatments   Labs (all labs ordered are listed, but only abnormal results are displayed) Labs Reviewed  CBC WITH DIFFERENTIAL/PLATELET - Abnormal; Notable for the following components:      Result Value   Hemoglobin 17.3 (*)    HCT 49.7 (*)    MCV 102.1 (*)    MCH 35.5 (*)    RDW 15.6 (*)    Lymphs Abs 0.2 (*)    All other components within normal limits  COMPREHENSIVE METABOLIC PANEL - Abnormal; Notable for the following components:   Sodium 134 (*)    CO2 21 (*)    All other components within normal limits  URINALYSIS, ROUTINE W REFLEX MICROSCOPIC - Abnormal; Notable for the following components:   APPearance CLOUDY (*)    Hgb urine dipstick LARGE (*)    Ketones, ur 20 (*)    Protein, ur 30 (*)    Leukocytes,Ua LARGE (*)    RBC / HPF >50 (*)    Bacteria, UA FEW (*)    All other components within normal limits  CULTURE, BLOOD (ROUTINE X 2)  CULTURE, BLOOD (ROUTINE X 2)  RESP PANEL BY RT-PCR (FLU A&B, COVID) ARPGX2  URINE CULTURE  GASTROINTESTINAL PANEL BY PCR, STOOL (REPLACES STOOL CULTURE)  C DIFFICILE QUICK SCREEN W PCR REFLEX    LACTIC ACID, PLASMA  LACTIC ACID, PLASMA    EKG EKG Interpretation  Date/Time:  Wednesday January 04 2021 14:08:46 EDT Ventricular Rate:  138 PR Interval:  92 QRS  Duration: 79 QT Interval:  307 QTC Calculation: 466 R Axis:   60 Text Interpretation: Sinus tachycardia Atrial premature complexes  Probable LVH with secondary repol abnrm No old tracing to compare Confirmed by Jacalyn Lefevre [phone]) on 01/04/2021 2:20:23 PM  Radiology DG Chest Portable 1 View  Result Date: 01/04/2021 CLINICAL DATA:  Fever EXAM: PORTABLE CHEST 1 VIEW COMPARISON:  None. FINDINGS: Lungs are hyperinflated and hyperlucent compatible with COPD. No infiltrate or effusion. Mild apical scarring bilaterally. Heart size and vascularity normal. IMPRESSION: COPD without acute abnormality. Electronically Signed   By: Marlan Palau M.D.   On: 01/04/2021 15:09   CT Renal Stone Study  Result Date: 01/04/2021 CLINICAL DATA:  65 year old with left abdominal pain and hematuria. EXAM: CT ABDOMEN AND PELVIS WITHOUT CONTRAST TECHNIQUE: Multidetector CT imaging of the abdomen and pelvis was performed following the standard protocol without IV contrast. COMPARISON:  None. FINDINGS: Lower chest: Emphysematous changes at the lung bases. No pleural effusions. Hepatobiliary: Normal appearance of the liver and gallbladder. Pancreas: Unremarkable. No pancreatic ductal dilatation or surrounding inflammatory changes. Spleen: Normal in size without focal abnormality. Adrenals/Urinary Tract: No gross abnormality to the adrenal glands. Negative for kidney stones or hydronephrosis. There is gas in the urinary bladder. Stomach/Bowel: Long segment of the sigmoid colon is markedly abnormal and appears to have diffuse wall thickening but poorly characterized without IV or oral contrast. This segment measures roughly 9.6 cm in length. Lumen of this abnormal sigmoid colon appears to markedly narrowed. Prominent mesocolon lymph nodes around the sigmoid colon. Few diverticula associated with this abnormal sigmoid colon. Moderate amount of stool in the rectum distal to the sigmoid colon thickening. Diverticula involving the left colon. Normal appearance of the stomach. No significant small bowel dilatation. Vascular/Lymphatic: Atherosclerotic calcifications in  the aorta without aneurysm. Slightly prominent lymph nodes in the sigmoid mesocolon region. Reproductive: Uterus appears to be present but very poorly characterized on this exam without intravenous contrast. There is a left adnexal mass that contains a focal fluid collection and small pockets of gas. The left adnexal fluid collection does not contain gas and measures 3.5 x 2.5 cm on sequence 2, image 54. The left adnexal mass appears to be contiguous with an extraluminal collection containing gas in the central aspect of the lower abdomen on sequence 2 image 49. This extraluminal collection containing gas roughly measures 3.3 x 2.3 cm. No gross abnormality to the right adnexal region. Other: Abnormal soft tissue centered around the sigmoid colon, top of the urinary bladder and uterine region. No significant ascites. Musculoskeletal: No acute bone abnormality. IMPRESSION: 1. Complex inflammatory process in the pelvis centered around the sigmoid colon. Long segment of sigmoid colon appears to be diffusely abnormal and thickened. Abnormal appearance of the left adnexa containing a focal fluid collection and small pockets of gas. In addition, there is an irregular extraluminal collection in the central lower abdomen that contains gas and appears to be associated with the left adnexal lesion/inflammatory process. There is also abnormal soft tissue along the bladder dome and there is gas within the bladder. Findings are suggestive for an underlying bowel perforation originating from the sigmoid colon with abscesses and fistula connections to the left adnexa and potentially the urinary bladder. Etiology could be secondary to diverticulitis but cannot exclude a colonic neoplasm. Recommend further characterization with a CT of the abdomen and pelvis with IV contrast. 2. Aortic Atherosclerosis (ICD10-I70.0) and Emphysema (ICD10-J43.9). These results were called by telephone at the time of interpretation on 01/04/2021  at 3:59  pm to provider [facility] , who verbally acknowledged these results. Electronically Signed   By: Richarda Overlie M.D.   On: 01/04/2021 16:05    Procedures Procedures   Medications Ordered in ED Medications  fluconazole (DIFLUCAN) tablet 150 mg (has no administration in time range)  sodium chloride 0.9 % bolus 1,000 mL (0 mLs Intravenous Stopped 01/04/21 1528)  acetaminophen (TYLENOL) tablet 1,000 mg (1,000 mg Oral Given 01/04/21 1356)  ibuprofen (ADVIL) tablet 600 mg (600 mg Oral Given 01/04/21 1356)  ondansetron (ZOFRAN) injection 4 mg (4 mg Intravenous Given 01/04/21 1358)  sodium chloride 0.9 % bolus 1,000 mL (1,000 mLs Intravenous New Bag/Given 01/04/21 1539)  cefTRIAXone (ROCEPHIN) 1 g in sodium chloride 0.9 % 100 mL IVPB (1 g Intravenous New Bag/Given 01/04/21 1540)    ED Course  I have reviewed the triage vital signs and the nursing notes.  Pertinent labs & imaging results that were available during my care of the patient were reviewed by me and considered in my medical decision making (see chart for details).    MDM Rules/Calculators/A&P                          Pt is feeling much better after IVFs and fever treatment.  Covid negative.  CXR clear.  UA abnormal.  Pt with diarrhea as well.  Renal scan done to eval for pyelo/colitis.  CT renal abnormal.  Pt d/w Dr. Lowella Dandy who recommended a CT abd pelvis with oral and iv contrast.  Pt signed out to Dr. Jacqulyn Bath at shift change.   Brandi Rowe was evaluated in Emergency Department on 01/04/2021 for the symptoms described in the history of present illness. She was evaluated in the context of the global COVID-19 pandemic, which necessitated consideration that the patient might be at risk for infection with the SARS-CoV-2 virus that causes COVID-19. Institutional protocols and algorithms that pertain to the evaluation of patients at risk for COVID-19 are in a state of rapid change based on information released by regulatory bodies including the CDC  and federal and state organizations. These policies and algorithms were followed during the patient's care in the ED.  Final Clinical Impression(s) / ED Diagnoses Final diagnoses:  Fever, unspecified fever cause    Rx / DC Orders ED Discharge Orders     None        Jacalyn Lefevre, MD 01/04/21 [phone]

## 2021-01-04 NOTE — H&P (Addendum)
History and Physical  Brandi Rowe XVQ:[id number] DOB: 02-24-1956 DOA: 01/04/2021  Referring physician: Maia Plan, MD PCP: Valentino Nose, NP  Patient coming from: Home   Chief Complaint:, Chills, diarrhea and nausea  HPI: Brandi Rowe is a 65 y.o. female with medical history significant for anxiety who presents to the emergency department due to 1 day onset of diarrhea, chills and nausea.  She states that she woke up this morning not feeling well and that she had diarrhea, chills and fever, this was associated with dry heaves.  She also felt febrile, though she did not check pressure. Patient complained of intermittent episodes of diarrhea since February of this year, the diarrhea may be several episodes daily for 1 to 2 days and this will self resolve to normal bowel movements, but this is also occasionally associated with sensation of stool in bladder associated with burning sensation on urination, she also complained of occasional pressure sensation on posterior part of her bladder.  She has been to her PCP and she has been treated for UTI at other times.  She denies chest pain, shortness of breath, headache, vomiting.  ED Course:  She was hemodynamically stable.  Work-up in the ED showed elevated H/H (17.3/49.7) and elevated MCV at 102.1.  BMP was normal except for sodium at 134.  Influenza A, B and SARS coronavirus 2 was negative.  Urinalysis was positive for hematuria, leukocytes, proteinuria and ketonuria. CT abdomen and pelvis with contrast showed Diffuse wall thickening in the sigmoid colon with surrounding inflammation consistent with diverticulitis. 2.5 cm diameter pericolonic abscess with radiating stranding. 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 Chest x-ray showed COPD without acute abnormality She was treated with Tylenol, ceftriaxone, fluconazole, IV hydration was provided and patient was also treated with IV Zosyn and Zofran was  started. General surgery (Dr. Lovell Sheehan) was consulted and agreed for patient to be admitted with plan to follow-up with patient in the morning.  Hospitalist was asked to admit patient for further evaluation and management.  Review of Systems: Constitutional: Positive for chills and fever.  HENT: Negative for ear pain and sore throat.   Eyes: Negative for pain and visual disturbance.  Respiratory: Negative for cough, chest tightness and shortness of breath.   Cardiovascular: Negative for chest pain and palpitations.  Gastrointestinal: Positive for diarrhea, nausea and abdominal pain  Endocrine: Negative for polyphagia and polyuria.  Genitourinary: Negative for decreased urine volume, dysuria, enuresis Musculoskeletal: Negative for arthralgias and back pain.  Skin: Negative for color change and rash.  Allergic/Immunologic: Negative for immunocompromised state.  Neurological: Negative for tremors, syncope, speech difficulty, weakness, light-headedness and headaches.  Hematological: Does not bruise/bleed easily.  All other systems reviewed and are negative    History reviewed. No pertinent past medical history. Past Surgical History:  Procedure Laterality Date   CARPAL TUNNEL RELEASE Right 2002   MANDIBLE SURGERY  1985    Social History:  reports that she has been smoking cigarettes. She has a 23.00 pack-year smoking history. She has never used smokeless tobacco. She reports current alcohol use of about 7.0 standard drinks of alcohol per week. She reports that she does not use drugs.   Allergies  Allergen Reactions   Grass Extracts [Gramineae Pollens]     Dust, mold, mites   Lipitor [Atorvastatin]     Myalgias     Family History  Problem Relation Age of Onset   Congestive Heart Failure Mother    Uterine  cancer Mother    Pancreatic cancer Father    Atrial fibrillation Brother    Stroke Maternal Grandmother    Heart disease Maternal Grandmother    Glaucoma Maternal Grandfather       Prior to Admission medications   Medication Sig Start Date End Date Taking? Authorizing Provider  acetaminophen (TYLENOL) 325 MG tablet Take 650 mg by mouth every 6 (six) hours as needed for mild pain.   Yes [provider]  aspirin EC 81 MG tablet Take 81 mg by mouth daily.   Yes [provider]  diphenhydrAMINE (BENADRYL) 25 MG tablet Take 25 mg by mouth at bedtime.   Yes [provider]  diazepam (VALIUM) 5 MG tablet TAKE 1 TABLET BY MOUTH EVERY 12 HOURS AS NEEDED FOR ANXIETY Patient not taking: Reported on 01/04/2021 09/07/20   Salley Scarlet, MD  estradiol (ESTRACE) 0.1 MG/GM vaginal cream 1 gram 3 times weekly Patient not taking: Reported on 01/04/2021 09/20/20   Lazaro Arms, MD  Estradiol 10 MCG TABS vaginal tablet Place 1 tablet (10 mcg total) vaginally at bedtime. After the first month use 3 nights a week Patient not taking: Reported on 01/04/2021 09/16/20 03/15/21  Lazaro Arms, MD  sulfamethoxazole-trimethoprim (BACTRIM DS) 800-160 MG tablet Take 1 tablet by mouth 2 (two) times daily. Patient not taking: Reported on 01/04/2021 11/10/20   Valentino Nose, NP    Physical Exam: BP 139/86   Pulse 93   Temp 98.8 F (37.1 C) (Oral)   Resp 18   Ht 5\' 4"  (1.626 m)   Wt 50.8 kg   SpO2 93%   BMI 19.22 kg/m   General: 65 y.o. year-old female well developed well nourished in no acute distress.  Alert and oriented x3. HEENT: Dry mucous membrane.  NCAT, EOMI Neck: Supple, trachea medial Cardiovascular: Tachycardia.  Regular rate and rhythm with no rubs or gallops.  No thyromegaly or JVD noted.  No lower extremity edema. 2/4 pulses in all 4 extremities. Respiratory: Clear to auscultation with no wheezes or rales. Good inspiratory effort. Abdomen: Soft, mild tenderness to palpation of LLQ. Normal bowel sounds x4 quadrants. Muskuloskeletal: No cyanosis, clubbing or edema noted bilaterally Neuro: CN II-XII intact, strength 5/5 x 4, sensation, reflexes  intact Skin: No ulcerative lesions noted or rashes Psychiatry: Mood is appropriate for condition and setting          Labs on Admission:  Basic Metabolic Panel: Recent Labs  Lab 01/04/21 1408  NA 134*  K 3.7  CL 100  CO2 21*  GLUCOSE 93  BUN 16  CREATININE 0.66  CALCIUM 8.9   Liver Function Tests: Recent Labs  Lab 01/04/21 1408  AST 20  ALT 14  ALKPHOS 118  BILITOT 1.2  PROT 7.1  ALBUMIN 3.8   No results for input(s): LIPASE, AMYLASE in the last 168 hours. No results for input(s): AMMONIA in the last 168 hours. CBC: Recent Labs  Lab 01/04/21 1408  WBC 6.1  NEUTROABS 5.8  HGB 17.3*  HCT 49.7*  MCV 102.1*  PLT 242   Cardiac Enzymes: No results for input(s): CKTOTAL, CKMB, CKMBINDEX, TROPONINI in the last 168 hours.  BNP (last 3 results) No results for input(s): BNP in the last 8760 hours.  ProBNP (last 3 results) No results for input(s): PROBNP in the last 8760 hours.  CBG: No results for input(s): GLUCAP in the last 168 hours.  Radiological Exams on Admission: CT ABDOMEN PELVIS W CONTRAST  Result Date: 01/04/2021  CLINICAL DATA:  Abdominal pain, fever, chills, nausea, and diarrhea. EXAM: CT ABDOMEN AND PELVIS WITH CONTRAST TECHNIQUE: Multidetector CT imaging of the abdomen and pelvis was performed using the standard protocol following bolus administration of intravenous contrast. CONTRAST:  OMNIPAQUE IOHEXOL 300 MG/ML  SOLN COMPARISON:  Noncontrast CT abdomen and pelvis 01/04/2021 FINDINGS: Lower chest: Motion artifact limits examination. Mild emphysematous changes suggested in the lung bases. Hepatobiliary: Mild periportal edema may be inflammatory or associated with chronic liver disease or infection. No focal liver lesions. Portal veins are patent. Gallbladder and bile ducts are normal. Pancreas: Unremarkable. No pancreatic ductal dilatation or surrounding inflammatory changes. Spleen: Normal in size without focal abnormality. Adrenals/Urinary Tract:  Adrenal glands are unremarkable. Kidneys are normal, without renal calculi, focal lesion, or hydronephrosis. Bladder contains gas without wall thickening. Gas in the bladder could arise from infection, instrumentation, or fistula. Stomach/Bowel: Stomach, small bowel, and colon are not abnormally distended. Contrast material flows to the rectum without evidence of obstruction. Segmental wall thickening and inflammatory changes involving the sigmoid colon. Scattered colonic diverticular present. Infiltration in the pericolonic fat with a small loculated thick-walled collection inside the sigmoid loop measuring about 2.5 cm diameter. Radiating strands extend towards the colon, adnexum, and bladder. Changes are most likely to represent acute diverticulitis with a pericolonic abscess and local fistulization. Follow-up after resolution of acute process is recommended to exclude underlying colonic neoplasm or possibly mesenteric neoplasm. Appendix is normal. Vascular/Lymphatic: Aortic atherosclerosis. No enlarged abdominal or pelvic lymph nodes. Reproductive: Uterus and ovaries are not enlarged. Cyst on the left ovary measuring 3.3 cm diameter. Other: No free air or free fluid in the abdomen. Abdominal wall musculature appears intact. Musculoskeletal: No acute or significant osseous findings. IMPRESSION: 1. Diffuse wall thickening in the sigmoid colon with surrounding inflammation consistent with diverticulitis. 2.5 cm diameter pericolonic abscess with radiating stranding. 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 Follow-up after resolution of acute process recommended to exclude underlying neoplasm. 2. 3.3 cm diameter cyst on the left ovary. Recommend follow-up US in 6-12 months. Note: This recommendation does not apply to premenarchal patients and to those with increased risk (genetic, family history, elevated tumor markers or other high-risk factors) of ovarian cancer. Reference: JACR 2020  Feb; 17(2):248-254 3. Aortic atherosclerosis. Electronically Signed   By: Burman Nieves M.D.   On: 01/04/2021 19:07   DG Chest Portable 1 View  Result Date: 01/04/2021 CLINICAL DATA:  Fever EXAM: PORTABLE CHEST 1 VIEW COMPARISON:  None. FINDINGS: Lungs are hyperinflated and hyperlucent compatible with COPD. No infiltrate or effusion. Mild apical scarring bilaterally. Heart size and vascularity normal. IMPRESSION: COPD without acute abnormality. Electronically Signed   By: Marlan Palau M.D.   On: 01/04/2021 15:09   CT Renal Stone Study  Result Date: 01/04/2021 CLINICAL DATA:  65 year old with left abdominal pain and hematuria. EXAM: CT ABDOMEN AND PELVIS WITHOUT CONTRAST TECHNIQUE: Multidetector CT imaging of the abdomen and pelvis was performed following the standard protocol without IV contrast. COMPARISON:  None. FINDINGS: Lower chest: Emphysematous changes at the lung bases. No pleural effusions. Hepatobiliary: Normal appearance of the liver and gallbladder. Pancreas: Unremarkable. No pancreatic ductal dilatation or surrounding inflammatory changes. Spleen: Normal in size without focal abnormality. Adrenals/Urinary Tract: No gross abnormality to the adrenal glands. Negative for kidney stones or hydronephrosis. There is gas in the urinary bladder. Stomach/Bowel: Long segment of the sigmoid colon is markedly abnormal and appears to have diffuse wall thickening but poorly characterized without IV  or oral contrast. This segment measures roughly 9.6 cm in length. Lumen of this abnormal sigmoid colon appears to markedly narrowed. Prominent mesocolon lymph nodes around the sigmoid colon. Few diverticula associated with this abnormal sigmoid colon. Moderate amount of stool in the rectum distal to the sigmoid colon thickening. Diverticula involving the left colon. Normal appearance of the stomach. No significant small bowel dilatation. Vascular/Lymphatic: Atherosclerotic calcifications in the aorta without  aneurysm. Slightly prominent lymph nodes in the sigmoid mesocolon region. Reproductive: Uterus appears to be present but very poorly characterized on this exam without intravenous contrast. There is a left adnexal mass that contains a focal fluid collection and small pockets of gas. The left adnexal fluid collection does not contain gas and measures 3.5 x 2.5 cm on sequence 2, image 54. The left adnexal mass appears to be contiguous with an extraluminal collection containing gas in the central aspect of the lower abdomen on sequence 2 image 49. This extraluminal collection containing gas roughly measures 3.3 x 2.3 cm. No gross abnormality to the right adnexal region. Other: Abnormal soft tissue centered around the sigmoid colon, top of the urinary bladder and uterine region. No significant ascites. Musculoskeletal: No acute bone abnormality. IMPRESSION: 1. Complex inflammatory process in the pelvis centered around the sigmoid colon. Long segment of sigmoid colon appears to be diffusely abnormal and thickened. Abnormal appearance of the left adnexa containing a focal fluid collection and small pockets of gas. In addition, there is an irregular extraluminal collection in the central lower abdomen that contains gas and appears to be associated with the left adnexal lesion/inflammatory process. There is also abnormal soft tissue along the bladder dome and there is gas within the bladder. Findings are suggestive for an underlying bowel perforation originating from the sigmoid colon with abscesses and fistula connections to the left adnexa and potentially the urinary bladder. Etiology could be secondary to diverticulitis but cannot exclude a colonic neoplasm. Recommend further characterization with a CT of the abdomen and pelvis with IV contrast. 2. Aortic Atherosclerosis (ICD10-I70.0) and Emphysema (ICD10-J43.9). These results were called by telephone at the time of interpretation on 01/04/2021 at 3:59 pm to provider  [facility] , who verbally acknowledged these results. Electronically Signed   By: Richarda Overlie M.D.   On: 01/04/2021 16:05    EKG: I independently viewed the EKG done and my findings are as followed: Sinus tachycardia at rate of 138 bpm with APCs  Assessment/Plan Present on Admission:  Sigmoid diverticulitis  Anxiety disorder, unspecified  Principal Problem:   Sigmoid diverticulitis Active Problems:   Anxiety disorder, unspecified   Acute diarrhea   Nausea   Acute febrile illness   Elevated MCV   Dehydration   Abdominal pain   Acute febrile illness possibly secondary to abdominal pain due to sigmoid colitis with pericolonic abscess CT abdomen and pelvis was suggestive of sigmoid diverticulitis with 2.5 cm pericolonic abscess with radiating stranding.  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 for moderate to severe pain Continue IV Zofran p.r.n. Continue clear liquid diet with plan to advance diet as tolerated; patient will be placed n.p.o. at midnight in hospital for possible surgical intervention in the morning Blood culture pending General surgery (Dr. Lovell Sheehan) was consulted t and will follow up with  patient in the morning per ED physician  Acute diarrhea and nausea in the setting of above Patient has not had any diarrhea since arrival to the ED Continue IV Zofran as needed  Dehydration Continue IV hydration  Elevated MCV MCV 102.1; vitamin B12 and folate levels will be checked  Anxiety Continue home meds when patient resumes oral intake    DVT prophylaxis: SCDs   Code Status: Full Code   Family Communication: Family at bedside (all questions answered to satisfaction)  Disposition Plan:  Patient is from:                         home Anticipated DC to:                   SNF or family members home Anticipated DC date:               2-3 days Anticipated DC barriers:          Patient requires inpatient management with pending surgical consult due to sigmoid colitis with possible fistula to bladder and left adnexal wall    Consults called: General surgery  Admission status: Inpatient    Frankey Shownladapo Jodie Leiner MD [facility]  01/04/2021, 11:39 PM

## 2021-01-04 NOTE — ED Provider Notes (Signed)
Blood pressure 139/81, pulse (!) 110, temperature (!) 101.6 F (38.7 C), temperature source Oral, resp. rate (!) 31, height 5\' 4"  (1.626 m), weight 50.8 kg, SpO2 98 %.  Assuming care from Dr. .  In short, Brandi Rowe is a 65 y.o. female with a chief complaint of Chills .  Refer to the original H&P for additional details.  The current plan of care is to follow up on repeat CT abdomen/pelvis with IV and PO contrast.  07:50 PM  Spoke with Dr. 71 regarding the CT findings.  Agrees with plan for admit, IV antibiotics, clear liquid diet.  He can consult but will need some of the acute inflammation/infection to be treated and then they can follow along from there.  The visualized abscess is very small at 2.5 cm.  Will discuss admission with the hospitalist.  Discussed patient's case with [facility] to request admission. Patient and family (if present) updated with plan. Care transferred to [facility] service.  I reviewed all nursing notes, vitals, pertinent old records, EKGs, labs, imaging (as available).     [facility], MD 01/07/21 1123

## 2021-01-04 NOTE — ED Triage Notes (Signed)
Pt to er, pt states that she was cleaning the house and started feeling really thirsty, states that she also stared having some chills, states that she has had diarrhea all day today, and is now having dry heaves as well.

## 2021-01-05 DIAGNOSIS — K5732 Diverticulitis of large intestine without perforation or abscess without bleeding: Secondary | ICD-10-CM

## 2021-01-05 DIAGNOSIS — A419 Sepsis, unspecified organism: Secondary | ICD-10-CM

## 2021-01-05 HISTORY — DX: Sepsis, unspecified organism: A41.9

## 2021-01-05 LAB — BASIC METABOLIC PANEL
Anion gap: 7 (ref 5–15)
BUN: 13 mg/dL (ref 8–23)
CO2: 28 mmol/L (ref 22–32)
Calcium: 8.5 mg/dL — ABNORMAL LOW (ref 8.9–10.3)
Chloride: 100 mmol/L (ref 98–111)
Creatinine, Ser: 0.75 mg/dL (ref 0.44–1.00)
GFR, Estimated: >60 mL/min (ref >60)
Glucose, Bld: 72 mg/dL (ref 70–99)
Potassium: 4.3 mmol/L (ref 3.5–5.1)
Sodium: 135 mmol/L (ref 135–145)

## 2021-01-05 LAB — CBC WITH DIFFERENTIAL/PLATELET
Abs Immature Granulocytes: 0.02 10*3/uL (ref 0.00–0.07)
Basophils Absolute: 0 10*3/uL (ref 0.0–0.1)
Basophils Relative: 0 %
Eosinophils Absolute: 0.1 10*3/uL (ref 0.0–0.5)
Eosinophils Relative: 1 %
HCT: 45.8 % (ref 36.0–46.0)
Hemoglobin: 16 g/dL — ABNORMAL HIGH (ref 12.0–15.0)
Immature Granulocytes: 0 %
Lymphocytes Relative: 6 %
Lymphs Abs: 0.5 10*3/uL — ABNORMAL LOW (ref 0.7–4.0)
MCH: 36.2 pg — ABNORMAL HIGH (ref 26.0–34.0)
MCHC: 34.9 g/dL (ref 30.0–36.0)
MCV: 103.6 fL — ABNORMAL HIGH (ref 80.0–100.0)
Monocytes Absolute: 0.4 10*3/uL (ref 0.1–1.0)
Monocytes Relative: 5 %
Neutro Abs: 7.1 10*3/uL (ref 1.7–7.7)
Neutrophils Relative %: 88 %
Platelets: 182 10*3/uL (ref 150–400)
RBC: 4.42 MIL/uL (ref 3.87–5.11)
RDW: 15.4 % (ref 11.5–15.5)
WBC: 8.1 10*3/uL (ref 4.0–10.5)
nRBC: 0 % (ref 0.0–0.2)

## 2021-01-05 LAB — MAGNESIUM: Magnesium: 1.7 mg/dL (ref 1.7–2.4)

## 2021-01-05 LAB — PHOSPHORUS: Phosphorus: 3.1 mg/dL (ref 2.5–4.6)

## 2021-01-05 LAB — VITAMIN B12: Vitamin B-12: 135 pg/mL — ABNORMAL LOW (ref 180–914)

## 2021-01-05 LAB — HIV ANTIBODY (ROUTINE TESTING W REFLEX): HIV Screen 4th Generation wRfx: NONREACTIVE

## 2021-01-05 LAB — FOLATE: Folate: 2.7 ng/mL — ABNORMAL LOW (ref >5.9)

## 2021-01-05 MED ORDER — AMOXICILLIN-POT CLAVULANATE 875-125 MG PO TABS: 1.0000 | ORAL_TABLET | Freq: Two times a day (BID) | ORAL | 0 refills | Status: AC

## 2021-01-05 MED ORDER — FOLIC ACID 1 MG PO TABS: 1.0000 mg | ORAL_TABLET | Freq: Every day | ORAL | 0 refills | Status: DC

## 2021-01-05 MED ORDER — VITAMIN B-12 1000 MCG PO TABS: 1000.0000 ug | ORAL_TABLET | Freq: Every day | ORAL | 0 refills | Status: AC

## 2021-01-05 MED ORDER — CYANOCOBALAMIN 1000 MCG/ML IJ SOLN
1000.0000 ug | Freq: Every day | INTRAMUSCULAR | Status: DC
  Administered 2021-01-05: 1000 ug via INTRAMUSCULAR
  Filled 2021-01-05: qty 1

## 2021-01-05 MED ORDER — FOLIC ACID 1 MG PO TABS: 1.0000 mg | ORAL_TABLET | Freq: Every day | ORAL | Status: DC

## 2021-01-05 MED ORDER — DEXTROSE IN LACTATED RINGERS 5 % IV SOLN: INTRAVENOUS | Status: DC

## 2021-01-05 MED ORDER — DIPHENHYDRAMINE HCL 25 MG PO CAPS
25.0000 mg | ORAL_CAPSULE | Freq: Once | ORAL | Status: AC
  Administered 2021-01-05: 25 mg via ORAL
  Filled 2021-01-05: qty 1

## 2021-01-05 NOTE — Discharge Summary (Addendum)
Physician Discharge Summary  Brandi Rowe HUT:[id number] DOB: [birth year] DOA: 01/04/2021  PCP: Valentino Nose, NP  Admit date: 01/04/2021 Discharge date: 01/05/2021  Discharge disposition: Home   Recommendations for Outpatient Follow-Up:   Follow-up with PCP within 1 week of discharge. Follow-up with general surgeon, Dr. Lovell Sheehan, on 01/19/2021   Discharge Diagnosis:   Principal Problem:   Sepsis [facility]) Active Problems:   Anxiety disorder, unspecified   Sigmoid diverticulitis   Acute diarrhea   Nausea   Acute febrile illness   Elevated MCV   Dehydration   Abdominal pain    Discharge Condition: Stable.  Diet recommendation:  Diet Order             Diet NPO time specified  Diet effective now           Diet - low sodium heart healthy                     Code Status: Full Code     Hospital Course:   Brandi Rowe is a 65 year old woman with medical history significant for anxiety who presented to the hospital because of acute onset of nausea, fever, chills, diarrhea and abdominal pain.  She was febrile, tachypneic, tachycardic and hypertensive in the emergency room.  She was admitted to the hospital for sepsis secondary to acute sigmoid diverticulitis with pericolonic abscess.  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  She was treated with analgesics, IV fluids and empiric IV antibiotics.  Sepsis physiology has resolved.  She was seen in consultation by the general surgeon, Dr. Lovell Sheehan.  Dr. Lovell Sheehan recommended conservative management and from his standpoint, patient could be discharged home today on oral antibiotics.  She has vitamin B12 deficiency and macrocytosis (B12 level was 135).  She was given 1 dose of vitamin B12 injection.  Vitamin B12 injection was recommended at discharge followed by oral vitamin B12 supplements (as the standard of care).  However,  patient said she prefers to take oral vitamin B12 only rather than take injections.  She also has folic acid deficiency and she was discharged on folic acid supplements.  Plan of care discussed with the patient and her husband at the bedside.   Medical Consultants:   General surgeon, Dr. Lovell Sheehan   Discharge Exam:    Vitals:   01/04/21 2000 01/04/21 2030 01/04/21 2358 01/05/21 0441  BP: (!) 142/93 139/86 (!) 152/94 (!) 146/92  Pulse: 86 93 80 85  Resp: (!) 23 18 19 18   Temp:   97.8 F (36.6 C) 98 F (36.7 C)  TempSrc:   Oral Oral  SpO2: 98% 93% 99% 100%  Weight:      Height:         GEN: NAD SKIN: Warm and dry EYES: EOMI ENT: MMM CV: RRR PULM: CTA B ABD: soft, ND, mild left lower quadrant tenderness, +BS CNS: AAO x 3, non focal EXT: No edema or tenderness   The results of significant diagnostics from this hospitalization (including imaging, microbiology, ancillary and laboratory) are listed below for reference.     Procedures and Diagnostic Studies:   CT ABDOMEN PELVIS W CONTRAST  Result Date: 01/04/2021 CLINICAL DATA:  Abdominal pain, fever, chills, nausea, and diarrhea. EXAM: CT ABDOMEN AND PELVIS WITH CONTRAST TECHNIQUE: Multidetector CT imaging of the abdomen and pelvis was performed using the standard protocol following bolus administration of intravenous  contrast. CONTRAST:  100mL OMNIPAQUE IOHEXOL 300 MG/ML  SOLN COMPARISON:  Noncontrast CT abdomen and pelvis 01/04/2021 FINDINGS: Lower chest: Motion artifact limits examination. Mild emphysematous changes suggested in the lung bases. Hepatobiliary: Mild periportal edema may be inflammatory or associated with chronic liver disease or infection. No focal liver lesions. Portal veins are patent. Gallbladder and bile ducts are normal. Pancreas: Unremarkable. No pancreatic ductal dilatation or surrounding inflammatory changes. Spleen: Normal in size without focal abnormality. Adrenals/Urinary Tract: Adrenal glands are  unremarkable. Kidneys are normal, without renal calculi, focal lesion, or hydronephrosis. Bladder contains gas without wall thickening. Gas in the bladder could arise from infection, instrumentation, or fistula. Stomach/Bowel: Stomach, small bowel, and colon are not abnormally distended. Contrast material flows to the rectum without evidence of obstruction. Segmental wall thickening and inflammatory changes involving the sigmoid colon. Scattered colonic diverticular present. Infiltration in the pericolonic fat with a small loculated thick-walled collection inside the sigmoid loop measuring about 2.5 cm diameter. Radiating strands extend towards the colon, adnexum, and bladder. Changes are most likely to represent acute diverticulitis with a pericolonic abscess and local fistulization. Follow-up after resolution of acute process is recommended to exclude underlying colonic neoplasm or possibly mesenteric neoplasm. Appendix is normal. Vascular/Lymphatic: Aortic atherosclerosis. No enlarged abdominal or pelvic lymph nodes. Reproductive: Uterus and ovaries are not enlarged. Cyst on the left ovary measuring 3.3 cm diameter. Other: No free air or free fluid in the abdomen. Abdominal wall musculature appears intact. Musculoskeletal: No acute or significant osseous findings. IMPRESSION: 1. Diffuse wall thickening in the sigmoid colon with surrounding inflammation consistent with diverticulitis. 2.5 cm diameter pericolonic abscess with radiating stranding. 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 Follow-up after resolution of acute process recommended to exclude underlying neoplasm. 2. 3.3 cm diameter cyst on the left ovary. Recommend follow-up US in 6-12 months. Note: This recommendation does not apply to premenarchal patients and to those with increased risk (genetic, family history, elevated tumor markers or other high-risk factors) of ovarian cancer. Reference: JACR 2020 Feb; 17(2):248-254  3. Aortic atherosclerosis. Electronically Signed   By: Burman NievesWilliam  Stevens M.D.   On: 01/04/2021 19:07   DG Chest Portable 1 View  Result Date: 01/04/2021 CLINICAL DATA:  Fever EXAM: PORTABLE CHEST 1 VIEW COMPARISON:  None. FINDINGS: Lungs are hyperinflated and hyperlucent compatible with COPD. No infiltrate or effusion. Mild apical scarring bilaterally. Heart size and vascularity normal. IMPRESSION: COPD without acute abnormality. Electronically Signed   By: Marlan Palauharles  Clark M.D.   On: 01/04/2021 15:09   CT Renal Stone Study  Result Date: 01/04/2021 CLINICAL DATA:  65 year old with left abdominal pain and hematuria. EXAM: CT ABDOMEN AND PELVIS WITHOUT CONTRAST TECHNIQUE: Multidetector CT imaging of the abdomen and pelvis was performed following the standard protocol without IV contrast. COMPARISON:  None. FINDINGS: Lower chest: Emphysematous changes at the lung bases. No pleural effusions. Hepatobiliary: Normal appearance of the liver and gallbladder. Pancreas: Unremarkable. No pancreatic ductal dilatation or surrounding inflammatory changes. Spleen: Normal in size without focal abnormality. Adrenals/Urinary Tract: No gross abnormality to the adrenal glands. Negative for kidney stones or hydronephrosis. There is gas in the urinary bladder. Stomach/Bowel: Long segment of the sigmoid colon is markedly abnormal and appears to have diffuse wall thickening but poorly characterized without IV or oral contrast. This segment measures roughly 9.6 cm in length. Lumen of this abnormal sigmoid colon appears to markedly narrowed. Prominent mesocolon lymph nodes around the sigmoid colon. Few diverticula associated with this abnormal sigmoid colon.  Moderate amount of stool in the rectum distal to the sigmoid colon thickening. Diverticula involving the left colon. Normal appearance of the stomach. No significant small bowel dilatation. Vascular/Lymphatic: Atherosclerotic calcifications in the aorta without aneurysm. Slightly  prominent lymph nodes in the sigmoid mesocolon region. Reproductive: Uterus appears to be present but very poorly characterized on this exam without intravenous contrast. There is a left adnexal mass that contains a focal fluid collection and small pockets of gas. The left adnexal fluid collection does not contain gas and measures 3.5 x 2.5 cm on sequence 2, image 54. The left adnexal mass appears to be contiguous with an extraluminal collection containing gas in the central aspect of the lower abdomen on sequence 2 image 49. This extraluminal collection containing gas roughly measures 3.3 x 2.3 cm. No gross abnormality to the right adnexal region. Other: Abnormal soft tissue centered around the sigmoid colon, top of the urinary bladder and uterine region. No significant ascites. Musculoskeletal: No acute bone abnormality. IMPRESSION: 1. Complex inflammatory process in the pelvis centered around the sigmoid colon. Long segment of sigmoid colon appears to be diffusely abnormal and thickened. Abnormal appearance of the left adnexa containing a focal fluid collection and small pockets of gas. In addition, there is an irregular extraluminal collection in the central lower abdomen that contains gas and appears to be associated with the left adnexal lesion/inflammatory process. There is also abnormal soft tissue along the bladder dome and there is gas within the bladder. Findings are suggestive for an underlying bowel perforation originating from the sigmoid colon with abscesses and fistula connections to the left adnexa and potentially the urinary bladder. Etiology could be secondary to diverticulitis but cannot exclude a colonic neoplasm. Recommend further characterization with a CT of the abdomen and pelvis with IV contrast. 2. Aortic Atherosclerosis (ICD10-I70.0) and Emphysema (ICD10-J43.9). These results were called by telephone at the time of interpretation on 01/04/2021 at 3:59 pm to provider [facility] , who  verbally acknowledged these results. Electronically Signed   By: Richarda Overlie M.D.   On: 01/04/2021 16:05     Labs:   Basic Metabolic Panel: Recent Labs  Lab 01/04/21 1408 01/05/21 0448  NA 134* 135  K 3.7 4.3  CL 100 100  CO2 21* 28  GLUCOSE 93 72  BUN 16 13  CREATININE 0.66 0.75  CALCIUM 8.9 8.5*  MG  --  1.7  PHOS  --  3.1   GFR Estimated Creatinine Clearance: 55.5 mL/min (by C-G formula based on SCr of 0.75 mg/dL). Liver Function Tests: Recent Labs  Lab 01/04/21 1408  AST 20  ALT 14  ALKPHOS 118  BILITOT 1.2  PROT 7.1  ALBUMIN 3.8   No results for input(s): LIPASE, AMYLASE in the last 168 hours. No results for input(s): AMMONIA in the last 168 hours. Coagulation profile No results for input(s): INR, PROTIME in the last 168 hours.  CBC: Recent Labs  Lab 01/04/21 1408 01/05/21 0855  WBC 6.1 8.1  NEUTROABS 5.8 7.1  HGB 17.3* 16.0*  HCT 49.7* 45.8  MCV 102.1* 103.6*  PLT 242 182   Cardiac Enzymes: No results for input(s): CKTOTAL, CKMB, CKMBINDEX, TROPONINI in the last 168 hours. BNP: Invalid input(s): POCBNP CBG: No results for input(s): GLUCAP in the last 168 hours. D-Dimer No results for input(s): DDIMER in the last 72 hours. Hgb A1c No results for input(s): HGBA1C in the last 72 hours. Lipid Profile No results for input(s): CHOL, HDL, LDLCALC, TRIG, CHOLHDL, LDLDIRECT in  the last 72 hours. Thyroid function studies No results for input(s): TSH, T4TOTAL, T3FREE, THYROIDAB in the last 72 hours.  Invalid input(s): FREET3 Anemia work up Recent Labs    01/05/21 0448  VITAMINB12 135*  FOLATE 2.7*   Microbiology Recent Results (from the past 240 hour(s))  Resp Panel by RT-PCR (Flu A&B, Covid) Nasopharyngeal Swab     Status: None   Collection Time: 01/04/21  1:47 PM   Specimen: Nasopharyngeal Swab; Nasopharyngeal(NP) swabs in vial transport medium  Result Value Ref Range Status   SARS Coronavirus 2 by RT PCR NEGATIVE NEGATIVE Final     Comment: (NOTE) SARS-CoV-2 target nucleic acids are NOT DETECTED.  The SARS-CoV-2 RNA is generally detectable in upper respiratory specimens during the acute phase of infection. The lowest concentration of SARS-CoV-2 viral copies this assay can detect is 138 copies/mL. A negative result does not preclude SARS-Cov-2 infection and should not be used as the sole basis for treatment or other patient management decisions. A negative result may occur with  improper specimen collection/handling, submission of specimen other than nasopharyngeal swab, presence of viral mutation(s) within the areas targeted by this assay, and inadequate number of viral copies(<138 copies/mL). A negative result must be combined with clinical observations, patient history, and epidemiological information. The expected result is Negative.  Fact Sheet for Patients:  BloggerCourse.com  Fact Sheet for Healthcare Providers:  SeriousBroker.it  This test is no t yet approved or cleared by the [place] FDA and  has been authorized for detection and/or diagnosis of SARS-CoV-2 by FDA under an Emergency Use Authorization (EUA). This EUA will remain  in effect (meaning this test can be used) for the duration of the COVID-19 declaration under Section 564(b)(1) of the Act, 21 U.S.C.section 360bbb-3(b)(1), unless the authorization is terminated  or revoked sooner.       Influenza A by PCR NEGATIVE NEGATIVE Final   Influenza B by PCR NEGATIVE NEGATIVE Final    Comment: (NOTE) The Xpert Xpress SARS-CoV-2/FLU/RSV plus assay is intended as an aid in the diagnosis of influenza from Nasopharyngeal swab specimens and should not be used as a sole basis for treatment. Nasal washings and aspirates are unacceptable for Xpert Xpress SARS-CoV-2/FLU/RSV testing.  Fact Sheet for Patients: BloggerCourse.com  Fact Sheet for Healthcare  Providers: SeriousBroker.it  This test is not yet approved or cleared by the [place] FDA and has been authorized for detection and/or diagnosis of SARS-CoV-2 by FDA under an Emergency Use Authorization (EUA). This EUA will remain in effect (meaning this test can be used) for the duration of the COVID-19 declaration under Section 564(b)(1) of the Act, 21 U.S.C. section 360bbb-3(b)(1), unless the authorization is terminated or revoked.  Performed at [facility], [address], [place], [place] [address]   Culture, blood (routine x 2)     Status: None (Preliminary result)   Collection Time: 01/04/21  2:03 PM   Specimen: BLOOD  Result Value Ref Range Status   Specimen Description BLOOD LEFT ANTECUBITAL  Final   Special Requests   Final    Blood Culture results may not be optimal due to an inadequate volume of blood received in culture bottles BOTTLES DRAWN AEROBIC AND ANAEROBIC   Culture   Final    NO GROWTH < 24 HOURS Performed at [facility], [address], [place], [place] [address]    Report Status PENDING  Incomplete  Culture, blood (routine x 2)     Status: None (Preliminary result)   Collection Time:  01/04/21  2:03 PM   Specimen: BLOOD  Result Value Ref Range Status   Specimen Description BLOOD RIGHT ANTECUBITAL  Final   Special Requests   Final    Blood Culture results may not be optimal due to an excessive volume of blood received in culture bottles BOTTLES DRAWN AEROBIC AND ANAEROBIC   Culture   Final    NO GROWTH < 24 HOURS Performed at [facility], [address] [place], [place] [address]    Report Status PENDING  Incomplete     Discharge Instructions:   Discharge Instructions     Diet - low sodium heart healthy   Complete by: As directed    Increase activity slowly   Complete by: As directed       Allergies as of 01/05/2021       Reactions   Grass Extracts [gramineae Pollens]    Dust, mold, mites   Lipitor  [atorvastatin]    Myalgias         Medication List     STOP taking these medications    diazepam 5 MG tablet Commonly known as: VALIUM   estradiol 0.1 MG/GM vaginal cream Commonly known as: ESTRACE   Estradiol 10 MCG Tabs vaginal tablet   sulfamethoxazole-trimethoprim 800-160 MG tablet Commonly known as: BACTRIM DS       TAKE these medications    acetaminophen 325 MG tablet Commonly known as: TYLENOL Take 650 mg by mouth every 6 (six) hours as needed for mild pain.   amoxicillin-clavulanate 875-125 MG tablet Commonly known as: Augmentin Take 1 tablet by mouth 2 (two) times daily for 13 days.   aspirin EC 81 MG tablet Take 81 mg by mouth daily.   diphenhydrAMINE 25 MG tablet Commonly known as: BENADRYL Take 25 mg by mouth at bedtime.   folic acid 1 MG tablet Commonly known as: FOLVITE Take 1 tablet (1 mg total) by mouth daily. Start taking on: January 06, 2021   vitamin B-12 1000 MCG tablet Commonly known as: CYANOCOBALAMIN Take 1 tablet (1,000 mcg total) by mouth daily.        Follow-up Information     Valentino Nose, NP. Schedule an appointment as soon as possible for a visit in 2 days.   Specialty: Nurse Practitioner Why: As needed Contact information: [address] [place] [address] [phone]         Franky Macho, MD. Schedule an appointment as soon as possible for a visit on 01/19/2021.   Specialty: General Surgery Contact information: [address] [place] [address] [phone]                  Time coordinating discharge: 36 minutes  Signed:  Lurene Shadow  [facility] 01/05/2021, 4:42 PM   Pager on [email or web address]. If 7PM-7AM, please contact night-coverage at [email or web address]

## 2021-01-05 NOTE — Consult Note (Addendum)
Reason for Consult: Sigmoid diverticulitis with fistulization and perforation Referring Physician: Dr. Herbie Rowe Brandi Rowe is an 65 y.o. female.  HPI: Patient is a 65 year old white female who presented to the emergency room with worsening diarrhea, nausea, and chills.  She presented to the emergency room and a CT scan of the abdomen was performed which revealed sigmoid diverticulitis with a small 2.5 cm pericolonic abscess with possible fistulization to the bladder and to the left adnexa.  Patient states that she has seen Dr. Despina Hidden of gynecology for intermittent vaginitis with drainage.  She recently also noted that she has passed air and stool in her urine.  This gives her dysuria and a pressure sensation on her bladder.  She has never had a colonoscopy.  This morning, she does feel better.  She has passed flatus with a small bowel movement.  History reviewed. No pertinent past medical history.  Past Surgical History:  Procedure Laterality Date   CARPAL TUNNEL RELEASE Right 2002   MANDIBLE SURGERY  1985    Family History  Problem Relation Age of Onset   Congestive Heart Failure Mother    Uterine cancer Mother    Pancreatic cancer Father    Atrial fibrillation Brother    Stroke Maternal Grandmother    Heart disease Maternal Grandmother    Glaucoma Maternal Grandfather     Social History:  reports that she has been smoking cigarettes. She has a 23.00 pack-year smoking history. She has never used smokeless tobacco. She reports current alcohol use of about 7.0 standard drinks of alcohol per week. She reports that she does not use drugs.  Allergies:  Allergies  Allergen Reactions   Grass Extracts [Gramineae Pollens]     Dust, mold, mites   Lipitor [Atorvastatin]     Myalgias     Medications: I have reviewed the patient's current medications. Prior to Admission:  Medications Prior to Admission  Medication Sig Dispense Refill Last Dose   acetaminophen (TYLENOL) 325 MG tablet Take  650 mg by mouth every 6 (six) hours as needed for mild pain.   01/04/2021 at 0830   aspirin EC 81 MG tablet Take 81 mg by mouth daily.   01/04/2021 at 0800   diphenhydrAMINE (BENADRYL) 25 MG tablet Take 25 mg by mouth at bedtime.   01/03/2021   diazepam (VALIUM) 5 MG tablet TAKE 1 TABLET BY MOUTH EVERY 12 HOURS AS NEEDED FOR ANXIETY (Patient not taking: Reported on 01/04/2021) 20 tablet 0 Not Taking   estradiol (ESTRACE) 0.1 MG/GM vaginal cream 1 gram 3 times weekly (Patient not taking: Reported on 01/04/2021) 30 g 12 Not Taking   Estradiol 10 MCG TABS vaginal tablet Place 1 tablet (10 mcg total) vaginally at bedtime. After the first month use 3 nights a week (Patient not taking: Reported on 01/04/2021) 30 tablet 5 Not Taking   sulfamethoxazole-trimethoprim (BACTRIM DS) 800-160 MG tablet Take 1 tablet by mouth 2 (two) times daily. (Patient not taking: Reported on 01/04/2021) 10 tablet 0 Not Taking    Results for orders placed or performed during the hospital encounter of 01/04/21 (from the past 48 hour(s))  Resp Panel by RT-PCR (Flu A&B, Covid) Nasopharyngeal Swab     Status: None   Collection Time: 01/04/21  1:47 PM   Specimen: Nasopharyngeal Swab; Nasopharyngeal(NP) swabs in vial transport medium  Result Value Ref Range   SARS Coronavirus 2 by RT PCR NEGATIVE NEGATIVE    Comment: (NOTE) SARS-CoV-2 target nucleic acids are NOT DETECTED.  The  SARS-CoV-2 RNA is generally detectable in upper respiratory specimens during the acute phase of infection. The lowest concentration of SARS-CoV-2 viral copies this assay can detect is 138 copies/mL. A negative result does not preclude SARS-Cov-2 infection and should not be used as the sole basis for treatment or other patient management decisions. A negative result may occur with  improper specimen collection/handling, submission of specimen other than nasopharyngeal swab, presence of viral mutation(s) within the areas targeted by this assay, and inadequate  number of viral copies(<138 copies/mL). A negative result must be combined with clinical observations, patient history, and epidemiological information. The expected result is Negative.  Fact Sheet for Patients:  BloggerCourse.comhttps://www.fda.gov/media/152166/download  Fact Sheet for Healthcare Providers:  SeriousBroker.ithttps://www.fda.gov/media/152162/download  This test is no t yet approved or cleared by the [place] FDA and  has been authorized for detection and/or diagnosis of SARS-CoV-2 by FDA under an Emergency Use Authorization (EUA). This EUA will remain  in effect (meaning this test can be used) for the duration of the COVID-19 declaration under Section 564(b)(1) of the Act, 21 U.S.C.section 360bbb-3(b)(1), unless the authorization is terminated  or revoked sooner.       Influenza A by PCR NEGATIVE NEGATIVE   Influenza B by PCR NEGATIVE NEGATIVE    Comment: (NOTE) The Xpert Xpress SARS-CoV-2/FLU/RSV plus assay is intended as an aid in the diagnosis of influenza from Nasopharyngeal swab specimens and should not be used as a sole basis for treatment. Nasal washings and aspirates are unacceptable for Xpert Xpress SARS-CoV-2/FLU/RSV testing.  Fact Sheet for Patients: BloggerCourse.comhttps://www.fda.gov/media/152166/download  Fact Sheet for Healthcare Providers: SeriousBroker.ithttps://www.fda.gov/media/152162/download  This test is not yet approved or cleared by the [place] FDA and has been authorized for detection and/or diagnosis of SARS-CoV-2 by FDA under an Emergency Use Authorization (EUA). This EUA will remain in effect (meaning this test can be used) for the duration of the COVID-19 declaration under Section 564(b)(1) of the Act, 21 U.S.C. section 360bbb-3(b)(1), unless the authorization is terminated or revoked.  Performed at [facility], [address] [place], [place] [address]   Culture, blood (routine x 2)     Status: None (Preliminary result)   Collection Time: 01/04/21  2:03 PM   Specimen: BLOOD   Result Value Ref Range   Specimen Description BLOOD LEFT ANTECUBITAL    Special Requests      Blood Culture results may not be optimal due to an inadequate volume of blood received in culture bottles BOTTLES DRAWN AEROBIC AND ANAEROBIC   Culture      NO GROWTH < 24 HOURS Performed at [facility], [address] [place], [place] [address]    Report Status PENDING   Culture, blood (routine x 2)     Status: None (Preliminary result)   Collection Time: 01/04/21  2:03 PM   Specimen: BLOOD  Result Value Ref Range   Specimen Description BLOOD RIGHT ANTECUBITAL    Special Requests      Blood Culture results may not be optimal due to an excessive volume of blood received in culture bottles BOTTLES DRAWN AEROBIC AND ANAEROBIC   Culture      NO GROWTH < 24 HOURS Performed at [facility], [address] [place], [place] [address]    Report Status PENDING   Lactic acid, plasma     Status: None   Collection Time: 01/04/21  2:03 PM  Result Value Ref Range   Lactic Acid, Venous 1.4 0.5 - 1.9 mmol/L    Comment: Performed at  [facility] [address] [place], [place] [address]  CBC with Differential     Status: Abnormal   Collection Time: 01/04/21  2:08 PM  Result Value Ref Range   WBC 6.1 4.0 - 10.5 K/uL   RBC 4.87 3.87 - 5.11 MIL/uL   Hemoglobin 17.3 (H) 12.0 - 15.0 g/dL   HCT 88.9 (H) 16.9 - 45.0 %   MCV 102.1 (H) 80.0 - 100.0 fL   MCH 35.5 (H) 26.0 - 34.0 pg   MCHC 34.8 30.0 - 36.0 g/dL   RDW 38.8 (H) 82.8 - 00.3 %   Platelets 242 150 - 400 K/uL   nRBC 0.0 0.0 - 0.2 %   Neutrophils Relative % 94 %   Neutro Abs 5.8 1.7 - 7.7 K/uL   Lymphocytes Relative 4 %   Lymphs Abs 0.2 (L) 0.7 - 4.0 K/uL   Monocytes Relative 1 %   Monocytes Absolute 0.1 0.1 - 1.0 K/uL   Eosinophils Relative 0 %   Eosinophils Absolute 0.0 0.0 - 0.5 K/uL   Basophils Relative 1 %   Basophils Absolute 0.0 0.0 - 0.1 K/uL   Immature Granulocytes 0 %   Abs Immature Granulocytes 0.02 0.00 - 0.07 K/uL     Comment: Performed at [facility], [address], [place], [place] [address]  Comprehensive metabolic panel     Status: Abnormal   Collection Time: 01/04/21  2:08 PM  Result Value Ref Range   Sodium 134 (L) 135 - 145 mmol/L   Potassium 3.7 3.5 - 5.1 mmol/L   Chloride 100 98 - 111 mmol/L   CO2 21 (L) 22 - 32 mmol/L   Glucose, Bld 93 70 - 99 mg/dL    Comment: Glucose reference range applies only to samples taken after fasting for at least 8 hours.   BUN 16 8 - 23 mg/dL   Creatinine, Ser 1.50 0.44 - 1.00 mg/dL   Calcium 8.9 8.9 - 56.9 mg/dL   Total Protein 7.1 6.5 - 8.1 g/dL   Albumin 3.8 3.5 - 5.0 g/dL   AST 20 15 - 41 U/L   ALT 14 0 - 44 U/L   Alkaline Phosphatase 118 38 - 126 U/L   Total Bilirubin 1.2 0.3 - 1.2 mg/dL   GFR, Estimated >79 >48 mL/min    Comment: (NOTE) Calculated using the CKD-EPI Creatinine Equation (2021)    Anion gap 13 5 - 15    Comment: Performed at [facility], [address] [place], [place] [address]  Urinalysis, Routine w reflex microscopic Urine, Clean Catch     Status: Abnormal   Collection Time: 01/04/21  2:24 PM  Result Value Ref Range   Color, Urine YELLOW YELLOW   APPearance CLOUDY (A) CLEAR   Specific Gravity, Urine 1.014 1.005 - 1.030   pH 5.0 5.0 - 8.0   Glucose, UA NEGATIVE NEGATIVE mg/dL   Hgb urine dipstick LARGE (A) NEGATIVE   Bilirubin Urine NEGATIVE NEGATIVE   Ketones, ur 20 (A) NEGATIVE mg/dL   Protein, ur 30 (A) NEGATIVE mg/dL   Nitrite NEGATIVE NEGATIVE   Leukocytes,Ua LARGE (A) NEGATIVE   RBC / HPF >50 (H) 0 - 5 RBC/hpf   Bacteria, UA FEW (A) NONE SEEN   Budding Yeast PRESENT     Comment: Performed at [facility], [address] [place], [place] [address]  Lactic acid, plasma     Status: None   Collection Time: 01/04/21  4:05 PM  Result Value Ref Range   Lactic  Acid, Venous 0.9 0.5 - 1.9 mmol/L    Comment: Performed at [facility], [address] [place], [place] [address]  Magnesium     Status: None   Collection Time:  01/05/21  4:48 AM  Result Value Ref Range   Magnesium 1.7 1.7 - 2.4 mg/dL    Comment: Performed at [facility], [address], [place], [place] [address]  Phosphorus     Status: None   Collection Time: 01/05/21  4:48 AM  Result Value Ref Range   Phosphorus 3.1 2.5 - 4.6 mg/dL    Comment: Performed at [facility] [address], [place], [place] [address]  Vitamin B12     Status: Abnormal   Collection Time: 01/05/21  4:48 AM  Result Value Ref Range   Vitamin B-12 135 (L) 180 - 914 pg/mL    Comment: (NOTE) This assay is not validated for testing neonatal or myeloproliferative syndrome specimens for Vitamin B12 levels. Performed at [facility], [address] [place], [place] [address]   Folate     Status: Abnormal   Collection Time: 01/05/21  4:48 AM  Result Value Ref Range   Folate 2.7 (L) >5.9 ng/mL    Comment: Performed at [facility], [address], [place], [place] [address]  Basic metabolic panel     Status: Abnormal   Collection Time: 01/05/21  4:48 AM  Result Value Ref Range   Sodium 135 135 - 145 mmol/L   Potassium 4.3 3.5 - 5.1 mmol/L   Chloride 100 98 - 111 mmol/L   CO2 28 22 - 32 mmol/L   Glucose, Bld 72 70 - 99 mg/dL    Comment: Glucose reference range applies only to samples taken after fasting for at least 8 hours.   BUN 13 8 - 23 mg/dL   Creatinine, Ser 5.78 0.44 - 1.00 mg/dL   Calcium 8.5 (L) 8.9 - 10.3 mg/dL   GFR, Estimated >46 >96 mL/min    Comment: (NOTE) Calculated using the CKD-EPI Creatinine Equation (2021)    Anion gap 7 5 - 15    Comment: Performed at [facility], [address] [place], [place] [address]  CBC with Differential/Platelet     Status: Abnormal   Collection Time: 01/05/21  8:55 AM  Result Value Ref Range   WBC 8.1 4.0 - 10.5 K/uL   RBC 4.42 3.87 - 5.11 MIL/uL   Hemoglobin 16.0 (H) 12.0 - 15.0 g/dL   HCT 41.3 24.4 - 01.0 %   MCV 103.6 (H) 80.0 - 100.0 fL   MCH 36.2 (H) 26.0 - 34.0 pg   MCHC 34.9 30.0 - 36.0 g/dL   RDW 27.2  53.6 - 64.4 %   Platelets 182 150 - 400 K/uL   nRBC 0.0 0.0 - 0.2 %   Neutrophils Relative % 88 %   Neutro Abs 7.1 1.7 - 7.7 K/uL   Lymphocytes Relative 6 %   Lymphs Abs 0.5 (L) 0.7 - 4.0 K/uL   Monocytes Relative 5 %   Monocytes Absolute 0.4 0.1 - 1.0 K/uL   Eosinophils Relative 1 %   Eosinophils Absolute 0.1 0.0 - 0.5 K/uL   Basophils Relative 0 %   Basophils Absolute 0.0 0.0 - 0.1 K/uL   Immature Granulocytes 0 %   Abs Immature Granulocytes 0.02 0.00 - 0.07 K/uL    Comment: Performed at [facility], [address], [place], [place] [address]    CT ABDOMEN PELVIS W CONTRAST  Result Date: 01/04/2021 CLINICAL DATA:  Abdominal pain, fever, chills, nausea, and diarrhea. EXAM: CT ABDOMEN AND PELVIS WITH CONTRAST TECHNIQUE: Multidetector CT imaging of the abdomen and pelvis was performed using the standard protocol following bolus administration of intravenous contrast. CONTRAST:  OMNIPAQUE IOHEXOL 300 MG/ML  SOLN COMPARISON:  Noncontrast CT abdomen and pelvis 01/04/2021 FINDINGS: Lower chest: Motion artifact limits examination. Mild emphysematous changes suggested in the lung bases. Hepatobiliary: Mild periportal edema may be inflammatory or associated with chronic liver disease or infection. No focal liver lesions. Portal veins are patent. Gallbladder and bile ducts are normal. Pancreas: Unremarkable. No pancreatic ductal dilatation or surrounding inflammatory changes. Spleen: Normal in size without focal abnormality. Adrenals/Urinary Tract: Adrenal glands are unremarkable. Kidneys are normal, without renal calculi, focal lesion, or hydronephrosis. Bladder contains gas without wall thickening. Gas in the bladder could arise from infection, instrumentation, or fistula. Stomach/Bowel: Stomach, small bowel, and colon are not abnormally distended. Contrast material flows to the rectum without evidence of obstruction. Segmental wall thickening and inflammatory changes involving the sigmoid colon.  Scattered colonic diverticular present. Infiltration in the pericolonic fat with a small loculated thick-walled collection inside the sigmoid loop measuring about 2.5 cm diameter. Radiating strands extend towards the colon, adnexum, and bladder. Changes are most likely to represent acute diverticulitis with a pericolonic abscess and local fistulization. Follow-up after resolution of acute process is recommended to exclude underlying colonic neoplasm or possibly mesenteric neoplasm. Appendix is normal. Vascular/Lymphatic: Aortic atherosclerosis. No enlarged abdominal or pelvic lymph nodes. Reproductive: Uterus and ovaries are not enlarged. Cyst on the left ovary measuring 3.3 cm diameter. Other: No free air or free fluid in the abdomen. Abdominal wall musculature appears intact. Musculoskeletal: No acute or significant osseous findings. IMPRESSION: 1. Diffuse wall thickening in the sigmoid colon with surrounding inflammation consistent with diverticulitis. 2.5 cm diameter pericolonic abscess with radiating stranding. 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 Follow-up after resolution of acute process recommended to exclude underlying neoplasm. 2. 3.3 cm diameter cyst on the left ovary. Recommend follow-up US in 6-12 months. Note: This recommendation does not apply to premenarchal patients and to those with increased risk (genetic, family history, elevated tumor markers or other high-risk factors) of ovarian cancer. Reference: JACR 2020 Feb; 17(2):248-254 3. Aortic atherosclerosis. Electronically Signed   By: Burman Nieves M.D.   On: 01/04/2021 19:07   DG Chest Portable 1 View  Result Date: 01/04/2021 CLINICAL DATA:  Fever EXAM: PORTABLE CHEST 1 VIEW COMPARISON:  None. FINDINGS: Lungs are hyperinflated and hyperlucent compatible with COPD. No infiltrate or effusion. Mild apical scarring bilaterally. Heart size and vascularity normal. IMPRESSION: COPD without acute abnormality.  Electronically Signed   By: Marlan Palau M.D.   On: 01/04/2021 15:09   CT Renal Stone Study  Result Date: 01/04/2021 CLINICAL DATA:  65 year old with left abdominal pain and hematuria. EXAM: CT ABDOMEN AND PELVIS WITHOUT CONTRAST TECHNIQUE: Multidetector CT imaging of the abdomen and pelvis was performed following the standard protocol without IV contrast. COMPARISON:  None. FINDINGS: Lower chest: Emphysematous changes at the lung bases. No pleural effusions. Hepatobiliary: Normal appearance of the liver and gallbladder. Pancreas: Unremarkable. No pancreatic ductal dilatation or surrounding inflammatory changes. Spleen: Normal in size without focal abnormality. Adrenals/Urinary Tract: No gross abnormality to the adrenal glands. Negative for kidney stones or hydronephrosis. There is gas in the urinary bladder. Stomach/Bowel: Long segment of the sigmoid colon is markedly abnormal and appears to have diffuse wall thickening but poorly characterized without IV or oral contrast.  This segment measures roughly 9.6 cm in length. Lumen of this abnormal sigmoid colon appears to markedly narrowed. Prominent mesocolon lymph nodes around the sigmoid colon. Few diverticula associated with this abnormal sigmoid colon. Moderate amount of stool in the rectum distal to the sigmoid colon thickening. Diverticula involving the left colon. Normal appearance of the stomach. No significant small bowel dilatation. Vascular/Lymphatic: Atherosclerotic calcifications in the aorta without aneurysm. Slightly prominent lymph nodes in the sigmoid mesocolon region. Reproductive: Uterus appears to be present but very poorly characterized on this exam without intravenous contrast. There is a left adnexal mass that contains a focal fluid collection and small pockets of gas. The left adnexal fluid collection does not contain gas and measures 3.5 x 2.5 cm on sequence 2, image 54. The left adnexal mass appears to be contiguous with an extraluminal  collection containing gas in the central aspect of the lower abdomen on sequence 2 image 49. This extraluminal collection containing gas roughly measures 3.3 x 2.3 cm. No gross abnormality to the right adnexal region. Other: Abnormal soft tissue centered around the sigmoid colon, top of the urinary bladder and uterine region. No significant ascites. Musculoskeletal: No acute bone abnormality. IMPRESSION: 1. Complex inflammatory process in the pelvis centered around the sigmoid colon. Long segment of sigmoid colon appears to be diffusely abnormal and thickened. Abnormal appearance of the left adnexa containing a focal fluid collection and small pockets of gas. In addition, there is an irregular extraluminal collection in the central lower abdomen that contains gas and appears to be associated with the left adnexal lesion/inflammatory process. There is also abnormal soft tissue along the bladder dome and there is gas within the bladder. Findings are suggestive for an underlying bowel perforation originating from the sigmoid colon with abscesses and fistula connections to the left adnexa and potentially the urinary bladder. Etiology could be secondary to diverticulitis but cannot exclude a colonic neoplasm. Recommend further characterization with a CT of the abdomen and pelvis with IV contrast. 2. Aortic Atherosclerosis (ICD10-I70.0) and Emphysema (ICD10-J43.9). These results were called by telephone at the time of interpretation on 01/04/2021 at 3:59 pm to provider [facility] , who verbally acknowledged these results. Electronically Signed   By: Richarda Overlie M.D.   On: 01/04/2021 16:05    ROS:  Pertinent items are noted in HPI.  Blood pressure (!) 146/92, pulse 85, temperature 98 F (36.7 C), temperature source Oral, resp. rate 18, height  (1.626 m), weight 50.8 kg, SpO2 100 %. Physical Exam: Pleasant well-developed well-nourished white female no acute distress Head is normocephalic, atraumatic Lungs  are clear to auscultation with your breath sounds bilaterally Heart examination reveals a regular rate and rhythm without S3, S4, murmurs Abdomen is soft with minimal discomfort to deep palpation in the left lower quadrant and suprapubic regions.  No rigidity is noted.  CT scan images personally reviewed  Assessment/Plan: Impression: Sigmoid diverticulitis with contained small abscess, fistulization to the bladder and possible left adnexa.  No need for acute surgical invention at this time. Plan: Patient would like to go home, which is fine with me.  I would treat her with a 2-week course of oral antibiotics, either Augmentin or ciprofloxacin/Flagyl.  I will see her in my office in 2 weeks.  Further work-up will be done at that time.  She was instructed to call me should she have any worsening symptoms.  I will arrange follow-up with Drs. McKenzie and Eure to assist with work-up.  Franky Macho 01/05/2021,  10:33 AM

## 2021-01-05 NOTE — Care Management CC44 (Signed)
Condition Code 44 Documentation Completed  Patient Details  Name: Brandi Rowe MRN: [id number] Date of Birth: [birth year]   Condition Code 44 given:  Yes Patient signature on Condition Code 44 notice:  Yes Documentation of 2 MD's agreement:  Yes Code 44 added to claim:  Yes    Villa Herb, LCSWA 01/05/2021, 12:16 PM

## 2021-01-05 NOTE — Care Management Obs Status (Signed)
MEDICARE OBSERVATION STATUS NOTIFICATION   Patient Details  Name: Brandi Rowe MRN: [id number] Date of Birth: [birth year]   Medicare Observation Status Notification Given:  Yes    Villa Herb, LCSWA 01/05/2021, 12:16 PM

## 2021-01-06 ENCOUNTER — Encounter: Payer: Self-pay | Admitting: Nurse Practitioner

## 2021-01-07 LAB — URINE CULTURE: Culture: 80000 — AB

## 2021-01-09 LAB — CULTURE, BLOOD (ROUTINE X 2)
Culture: NO GROWTH
Culture: NO GROWTH

## 2021-01-19 ENCOUNTER — Ambulatory Visit: Payer: Medicare Other | Admitting: General Surgery

## 2021-01-19 ENCOUNTER — Encounter: Payer: Self-pay | Admitting: General Surgery

## 2021-01-19 ENCOUNTER — Other Ambulatory Visit: Payer: Self-pay

## 2021-01-19 VITALS — BP 144/92 | HR 98 | Temp 98.5°F | Resp 14 | Ht 64.0 in | Wt 112.2 lb

## 2021-01-19 DIAGNOSIS — K5732 Diverticulitis of large intestine without perforation or abscess without bleeding: Secondary | ICD-10-CM

## 2021-01-19 NOTE — Progress Notes (Signed)
Subjective:     Brandi Rowe  Here for hospital follow-up, status post hospitalization for sigmoid diverticulitis with possible fistulization to the bladder and vagina.  Patient has been taking her antibiotics as prescribed.  She does feel better and has minimal pain in the left lower quadrant.  She does state that she is still passing air and tissue when she voids.  She has noticed less drainage from her vagina.  She denies any fever or chills.  She is having regular bowel movements. Objective:    BP (!) 144/92   Pulse 98   Temp 98.5 F (36.9 C) (Oral)   Resp 14   Ht 5\' 4"  (1.626 m)   Wt 112 lb 3.2 oz (50.9 kg)   SpO2 97%   BMI 19.26 kg/m   General:  alert, cooperative, and no distress  Abdomen is soft, nontender, nondistended. 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  Further management is pending those results.  May stop antibiotics when she has completed her regimen which is in the probably 2 to 3 days.

## 2021-01-27 ENCOUNTER — Ambulatory Visit (HOSPITAL_COMMUNITY)
Admission: RE | Admit: 2021-01-27 | Discharge: 2021-01-27 | Disposition: A | Discharge status: Home / Self Care | Payer: Medicare Other | Source: Ambulatory Visit | Attending: General Surgery | Admitting: General Surgery

## 2021-01-27 ENCOUNTER — Other Ambulatory Visit: Payer: Self-pay

## 2021-01-27 DIAGNOSIS — K5732 Diverticulitis of large intestine without perforation or abscess without bleeding: Secondary | ICD-10-CM

## 2021-01-27 MED ORDER — IOHEXOL 300 MG/ML  SOLN
25.0000 mL | Freq: Once | INTRAMUSCULAR | Status: AC | PRN
  Administered 2021-01-27: 25 mL via INTRATHECAL

## 2021-01-27 MED ORDER — IOHEXOL 300 MG/ML  SOLN
100.0000 mL | Freq: Once | INTRAMUSCULAR | Status: AC | PRN
  Administered 2021-01-27: 100 mL via INTRAVENOUS

## 2021-02-15 ENCOUNTER — Encounter: Payer: Self-pay | Admitting: General Surgery

## 2021-02-23 ENCOUNTER — Other Ambulatory Visit: Payer: Self-pay

## 2021-02-23 ENCOUNTER — Encounter: Payer: Self-pay | Admitting: General Surgery

## 2021-02-23 ENCOUNTER — Ambulatory Visit: Payer: Medicare Other | Admitting: General Surgery

## 2021-02-23 VITALS — BP 128/75 | HR 77 | Temp 98.2°F | Resp 16 | Ht 64.0 in | Wt 112.0 lb

## 2021-02-23 DIAGNOSIS — N321 Vesicointestinal fistula: Secondary | ICD-10-CM | POA: Diagnosis not present

## 2021-02-24 NOTE — Progress Notes (Signed)
Subjective:     Brandi Rowe  Patient is here for follow-up of colovesical fistula.  Patient is still passing air and stool in her urine.  She does not know if she is still passing anything per her vagina.  She denies any abdominal pain, nausea, vomiting, fever, or chills. 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 of gynecology and Dr. Despina Hidden of urology.  We will get their input and plan on the combined surgical procedure this fall.  Patient will need a partial colectomy with possible colostomy along with bladder repair and possible left nephrectomy.

## 2021-02-27 ENCOUNTER — Encounter: Payer: Self-pay | Admitting: Obstetrics & Gynecology

## 2021-02-27 ENCOUNTER — Other Ambulatory Visit: Payer: Self-pay

## 2021-02-27 ENCOUNTER — Ambulatory Visit: Payer: Medicare Other | Admitting: Obstetrics & Gynecology

## 2021-02-27 VITALS — BP 170/98 | HR 86 | Ht 64.0 in | Wt 113.0 lb

## 2021-02-27 DIAGNOSIS — N823 Fistula of vagina to large intestine: Secondary | ICD-10-CM

## 2021-02-27 DIAGNOSIS — K5732 Diverticulitis of large intestine without perforation or abscess without bleeding: Secondary | ICD-10-CM

## 2021-02-27 NOTE — Progress Notes (Signed)
Chief Complaint  Patient presents with   Ovarian Cyst    Left, seen on ct scan      65 y.o. G0P0000 No LMP recorded. Patient is postmenopausal. The current method of family planning is none.  Outpatient Encounter Medications as of 02/27/2021  Medication Sig   acetaminophen (TYLENOL) 325 MG tablet Take 650 mg by mouth every 6 (six) hours as needed for mild pain.   aspirin EC 81 MG tablet Take 81 mg by mouth daily.   diphenhydrAMINE (BENADRYL) 25 MG tablet Take 25 mg by mouth at bedtime.   folic acid (FOLVITE) 1 MG tablet Take 1 tablet (1 mg total) by mouth daily.   vitamin B-12 (CYANOCOBALAMIN) 1000 MCG tablet Take 1 tablet (1,000 mcg total) by mouth daily.   No facility-administered encounter medications on file as of 02/27/2021.    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 No pertinent past medical history.  Past Surgical History:  Procedure Laterality Date   CARPAL TUNNEL RELEASE Right 2002   MANDIBLE SURGERY  1985    OB History     Gravida  0   Para  0   Term  0   Preterm  0   AB  0   Living  0      SAB  0   IAB  0   Ectopic  0   Multiple  0   Live Births  0           Allergies  Allergen Reactions   Grass Extracts [Gramineae Pollens]     Dust, mold, mites   Lipitor [Atorvastatin]     Myalgias     Social History   Socioeconomic History   Marital status: Divorced    Spouse name: Not on file   Number of children: 0   Years of education: Not on file   Highest education level: Some college, no degree  Occupational History    Employer: [employer]  Tobacco Use   Smoking status: Every Day    Packs/day: 0.50     Years: 46.00    Pack years: 23.00    Types: Cigarettes   Smokeless tobacco: Never  Vaping Use   Vaping Use: Never used  Substance and Sexual Activity   Alcohol use: Yes    Alcohol/week: 7.0 standard drinks    Types: 7 Glasses of wine per week   Drug use: Never   Sexual activity: Not Currently    Partners: Male  Other Topics Concern   Not on file  Social History Narrative   Not on file   Social Determinants of Health   Financial Resource Strain: Not on file  Food Insecurity: Not on file  Transportation Needs: Not on file  Physical Activity: Not on file  Stress: Not on file  Social Connections: Not on file    Family History  Problem Relation Age of Onset   Congestive Heart Failure Mother    Uterine cancer Mother    Pancreatic cancer Father    Atrial fibrillation Brother    Stroke Maternal Grandmother    Heart disease Maternal Grandmother    Glaucoma Maternal Grandfather     Medications:  Current Outpatient Medications:    acetaminophen (TYLENOL) 325 MG tablet, Take 650 mg by mouth every 6 (six) hours as needed for mild pain., Disp: , Rfl:    aspirin EC 81 MG tablet, Take 81 mg by mouth daily., Disp: , Rfl:    diphenhydrAMINE (BENADRYL) 25 MG tablet, Take 25 mg by mouth at bedtime., Disp: , Rfl:    folic acid (FOLVITE) 1 MG tablet, Take 1 tablet (1 mg total) by mouth daily., Disp: 30 tablet, Rfl: 0   vitamin B-12 (CYANOCOBALAMIN) 1000 MCG tablet, Take 1 tablet (1,000 mcg total) by mouth daily., Disp: 30 tablet, Rfl: 0  Objective Blood pressure (!) 170/98, pulse 86, height 5\' 4"  (1.626 m), weight 113 lb (51.3 kg).  Gen WDWN NAD  Pertinent ROS No burning with urination, frequency or urgency No nausea, vomiting or diarrhea Nor fever chills or other constitutional symptoms   Labs or studies Reviewed her scans and labs    Impression Diagnoses this Encounter::   ICD-10-CM   1. 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 Sigmoid  diverticulitis  K57.32       Established relevant diagnosis(es):   Plan/Recommendations: No orders of the defined types were placed in this encounter.   Labs or Scans Ordered: No orders of the defined types were placed in this encounter.   Management:: Recommend supracervical hysterectomy with removal of both tubes ovaries, preservation of the cervix will hopefully diminish risk of post operative vaginal fistula, her current vaginal fistula should secondarily heal once resection diversion is performed  Pt understands my pre op thouhgt diversion will be needed  Follow up Return if symptoms worsen or fail to improve, for will coordinate with Dr Jenkins/Dr [phone].          All questions were answered.

## 2021-03-01 ENCOUNTER — Other Ambulatory Visit: Payer: Self-pay

## 2021-03-01 ENCOUNTER — Ambulatory Visit: Payer: Medicare Other | Admitting: Urology

## 2021-03-01 ENCOUNTER — Encounter: Payer: Self-pay | Admitting: Urology

## 2021-03-01 VITALS — BP 138/91 | HR 99

## 2021-03-01 DIAGNOSIS — N321 Vesicointestinal fistula: Secondary | ICD-10-CM

## 2021-03-01 LAB — URINALYSIS, ROUTINE W REFLEX MICROSCOPIC
Bilirubin, UA: NEGATIVE
Glucose, UA: NEGATIVE
Ketones, UA: NEGATIVE
Nitrite, UA: NEGATIVE
Specific Gravity, UA: >1.03 — ABNORMAL HIGH (ref 1.005–1.030)
Urobilinogen, Ur: 0.2 mg/dL (ref 0.2–1.0)
pH, UA: 6 (ref 5.0–7.5)

## 2021-03-01 LAB — MICROSCOPIC EXAMINATION: Renal Epithel, UA: NONE SEEN /hpf

## 2021-03-01 MED ORDER — PHENAZOPYRIDINE HCL 100 MG PO TABS: 100.0000 mg | ORAL_TABLET | Freq: Three times a day (TID) | ORAL | 1 refills | Status: DC | PRN

## 2021-03-01 NOTE — Progress Notes (Signed)
Urological Symptom Review  Patient is experiencing the following symptoms: Frequent urination Burning/pain with urination Get up at night to urinate Leakage of urine   Review of Systems  Gastrointestinal (upper)  : Indigestion/heartburn  Gastrointestinal (lower) : Diarrhea Constipation  Constitutional : Negative for symptoms  Skin: Negative for skin symptoms  Eyes: Negative for eye symptoms  Ear/Nose/Throat : Sinus problems  Hematologic/Lymphatic: Negative for Hematologic/Lymphatic symptoms  Cardiovascular : Negative for cardiovascular symptoms  Respiratory : Negative for respiratory symptoms  Endocrine: Negative for endocrine symptoms  Musculoskeletal: Negative for musculoskeletal symptoms  Neurological: Negative for neurological symptoms  Psychologic: Negative for psychiatric symptoms

## 2021-03-01 NOTE — Progress Notes (Signed)
03/01/2021 2:45 PM   Brandi Rowe [birth year] [id number]  Referring provider: Franky Macho, MD [address] [place],  [place] [address]  Colovesical fistula   HPI: Brandi Rowe is a [phone] here for evaluation for colovesical fistula. Starting in February she noted diffuse abdominal pain with associated diarrhea and vaginal discharge. The vaginal discharge decreased but then she developed pneumaturia and started passing feculent material in her urine. She denies any diarrhea. She has a history of diverticulitis diagnosed in 09/2020. She has severe LUTS including dysuria.   PMH: Past Medical History:  Diagnosis Date   Gout     Surgical History: Past Surgical History:  Procedure Laterality Date   CARPAL TUNNEL RELEASE Right 2002   MANDIBLE SURGERY  1985    Home Medications:  Allergies as of 03/01/2021       Reactions   Grass Extracts [gramineae Pollens]    Dust, mold, mites   Lipitor [atorvastatin]    Myalgias         Medication List        Accurate as of March 01, 2021  2:45 PM. If you have any questions, ask your nurse or doctor.          acetaminophen 325 MG tablet Commonly known as: TYLENOL Take 650 mg by mouth every 6 (six) hours as needed for mild pain.   aspirin EC 81 MG tablet Take 81 mg by mouth daily.   diphenhydrAMINE 25 MG tablet Commonly known as: BENADRYL Take 25 mg by mouth at bedtime.   folic acid 1 MG tablet Commonly known as: FOLVITE Take 1 tablet (1 mg total) by mouth daily.   vitamin B-12 1000 MCG tablet Commonly known as: CYANOCOBALAMIN Take 1 tablet (1,000 mcg total) by mouth daily.        Allergies:  Allergies  Allergen Reactions   Grass Extracts [Gramineae Pollens]     Dust, mold, mites   Lipitor [Atorvastatin]     Myalgias     Family History: Family History  Problem Relation Age of Onset   Congestive Heart Failure Mother    Uterine cancer Mother    Pancreatic cancer Father    Atrial fibrillation  Brother    Stroke Maternal Grandmother    Heart disease Maternal Grandmother    Glaucoma Maternal Grandfather     Social History:  reports that she has been smoking cigarettes. She has a 23.00 pack-year smoking history. She has never used smokeless tobacco. She reports current alcohol use of about 7.0 standard drinks of alcohol per week. She reports that she does not use drugs.  ROS: All other review of systems were reviewed and are negative except what is noted above in HPI  Physical Exam: BP (!) 138/91   Pulse 99   Constitutional:  Alert and oriented, No acute distress. HEENT: [place] AT, moist mucus membranes.  Trachea midline, no masses. Cardiovascular: No clubbing, cyanosis, or edema. Respiratory: Normal respiratory effort, no increased work of breathing. GI: Abdomen is soft, nontender, nondistended, no abdominal masses GU: No CVA tenderness.  Lymph: No cervical or inguinal lymphadenopathy. Skin: No rashes, bruises or suspicious lesions. Neurologic: Grossly intact, no focal deficits, moving all 4 extremities. Psychiatric: Normal mood and affect.  Laboratory Data: Lab Results  Component Value Date   WBC 8.1 01/05/2021   HGB 16.0 (H) 01/05/2021   HCT 45.8 01/05/2021   MCV 103.6 (H) 01/05/2021   PLT 182 01/05/2021    Lab Results  Component Value Date   CREATININE  0.75 01/05/2021    No results found for: PSA  No results found for: TESTOSTERONE  Lab Results  Component Value Date   HGBA1C 5.4 09/24/2018    Urinalysis    Component Value Date/Time   COLORURINE YELLOW 01/04/2021 1424   APPEARANCEUR CLOUDY (A) 01/04/2021 1424   LABSPEC 1.014 01/04/2021 1424   PHURINE 5.0 01/04/2021 1424   GLUCOSEU NEGATIVE 01/04/2021 1424   HGBUR LARGE (A) 01/04/2021 1424   BILIRUBINUR NEGATIVE 01/04/2021 1424   KETONESUR 20 (A) 01/04/2021 1424   PROTEINUR 30 (A) 01/04/2021 1424   NITRITE NEGATIVE 01/04/2021 1424   LEUKOCYTESUR LARGE (A) 01/04/2021 1424    Lab Results   Component Value Date   BACTERIA FEW (A) 01/04/2021    Pertinent Imaging: CT 01/27/2021: Images reviewed and discussed with the patient No results found for this or any previous visit.  No results found for this or any previous visit.  No results found for this or any previous visit.  No results found for this or any previous visit.  No results found for this or any previous visit.  No results found for this or any previous visit.  No results found for this or any previous visit.  Results for orders placed during the hospital encounter of 01/04/21  CT Renal Stone Study  Narrative CLINICAL DATA:  65 year old with left abdominal pain and hematuria.  EXAM: CT ABDOMEN AND PELVIS WITHOUT CONTRAST  TECHNIQUE: Multidetector CT imaging of the abdomen and pelvis was performed following the standard protocol without IV contrast.  COMPARISON:  None.  FINDINGS: Lower chest: Emphysematous changes at the lung bases. No pleural effusions.  Hepatobiliary: Normal appearance of the liver and gallbladder.  Pancreas: Unremarkable. No pancreatic ductal dilatation or surrounding inflammatory changes.  Spleen: Normal in size without focal abnormality.  Adrenals/Urinary Tract: No gross abnormality to the adrenal glands. Negative for kidney stones or hydronephrosis. There is gas in the urinary bladder.  Stomach/Bowel: Long segment of the sigmoid colon is markedly abnormal and appears to have diffuse wall thickening but poorly characterized without IV or oral contrast. This segment measures roughly 9.6 cm in length. Lumen of this abnormal sigmoid colon appears to markedly narrowed. Prominent mesocolon lymph nodes around the sigmoid colon. Few diverticula associated with this abnormal sigmoid colon. Moderate amount of stool in the rectum distal to the sigmoid colon thickening. Diverticula involving the left colon. Normal appearance of the stomach. No significant small  bowel dilatation.  Vascular/Lymphatic: Atherosclerotic calcifications in the aorta without aneurysm. Slightly prominent lymph nodes in the sigmoid mesocolon region.  Reproductive: Uterus appears to be present but very poorly characterized on this exam without intravenous contrast. There is a left adnexal mass that contains a focal fluid collection and small pockets of gas. The left adnexal fluid collection does not contain gas and measures 3.5 x 2.5 cm on sequence 2, image 54. The left adnexal mass appears to be contiguous with an extraluminal collection containing gas in the central aspect of the lower abdomen on sequence 2 image 49. This extraluminal collection containing gas roughly measures 3.3 x 2.3 cm. No gross abnormality to the right adnexal region.  Other: Abnormal soft tissue centered around the sigmoid colon, top of the urinary bladder and uterine region. No significant ascites.  Musculoskeletal: No acute bone abnormality.  IMPRESSION: 1. Complex inflammatory process in the pelvis centered around the sigmoid colon. Long segment of sigmoid colon appears to be diffusely abnormal and thickened. Abnormal appearance of the left adnexa containing a focal  fluid collection and small pockets of gas. In addition, there is an irregular extraluminal collection in the central lower abdomen that contains gas and appears to be associated with the left adnexal lesion/inflammatory process. There is also abnormal soft tissue along the bladder dome and there is gas within the bladder. Findings are suggestive for an underlying bowel perforation originating from the sigmoid colon with abscesses and fistula connections to the left adnexa and potentially the urinary bladder. Etiology could be secondary to diverticulitis but cannot exclude a colonic neoplasm. Recommend further characterization with a CT of the abdomen and pelvis with IV contrast. 2. Aortic Atherosclerosis (ICD10-I70.0) and  Emphysema (ICD10-J43.9).  These results were called by telephone at the time of interpretation on 01/04/2021 at 3:59 pm to provider [facility] , who verbally acknowledged these results.   Electronically Signed By: Richarda Overlie M.D. On: 01/04/2021 16:05   Assessment & Plan:    1. Colovesical fistula -We discussed the management of colovesical fistula including bowel diversion and fistula ligation. After discussing the options the patient wishes to proceed with surgery - Urinalysis, Routine w reflex microscopic   No follow-ups on file.  Wilkie Aye, MD  [facility] [place]

## 2021-03-07 ENCOUNTER — Ambulatory Visit: Payer: Medicare Other | Admitting: General Surgery

## 2021-03-21 ENCOUNTER — Encounter: Payer: Self-pay | Admitting: General Surgery

## 2021-03-21 ENCOUNTER — Encounter: Payer: Self-pay | Admitting: Urology

## 2021-03-21 NOTE — Patient Instructions (Signed)

## 2021-04-12 ENCOUNTER — Encounter: Payer: Self-pay | Admitting: Nurse Practitioner

## 2021-04-12 NOTE — Telephone Encounter (Signed)
Ok to refill??  Last office visit 11/10/2020.  Last refill 09/07/2020.

## 2021-04-13 NOTE — Telephone Encounter (Signed)
Please have her schedule an OV for me for refills

## 2021-04-21 NOTE — H&P (Signed)
Brandi Rowe is an 65 y.o. female.   Chief Complaint: Colovesical fistula HPI: Patient is a 65 year old white female who was diagnosed with pelvic abscess and colovesical fistula secondary to diverticulitis back in June 2022 who now presents for a partial colectomy with possible colostomy, repair of colovesical fistula, and TAH/BSO by Dr. Despina Hidden.  Patient has been treated for many months.  She has also been followed by Dr. Ronne Binning of urology.  She continues to have pneumaturia.  She has also had some vaginal discharge.  Past Medical History:  Diagnosis Date   Gout     Past Surgical History:  Procedure Laterality Date   CARPAL TUNNEL RELEASE Right 2002   MANDIBLE SURGERY  1985    Family History  Problem Relation Age of Onset   Congestive Heart Failure Mother    Uterine cancer Mother    Pancreatic cancer Father    Atrial fibrillation Brother    Stroke Maternal Grandmother    Heart disease Maternal Grandmother    Glaucoma Maternal Grandfather    Social History:  reports that she has been smoking cigarettes. She has a 23.00 pack-year smoking history. She has never used smokeless tobacco. She reports current alcohol use of about 7.0 standard drinks per week. She reports that she does not use drugs.  Allergies:  Allergies  Allergen Reactions   Grass Extracts [Gramineae Pollens]     Dust, mold, mites   Lipitor [Atorvastatin]     Myalgias     No medications prior to admission.    No results found for this or any previous visit (from the past 48 hour(s)). No results found.  Review of Systems  Constitutional: Negative.   HENT: Negative.    Eyes: Negative.   Respiratory: Negative.    Cardiovascular: Negative.   Gastrointestinal: Negative.   Endocrine: Negative.   Genitourinary:  Positive for dyspareunia, urgency and vaginal discharge.  Musculoskeletal: Negative.   Skin: Negative.   Allergic/Immunologic: Negative.   Neurological: Negative.   Hematological: Negative.    Psychiatric/Behavioral: Negative.     There were no vitals taken for this visit. Physical Exam Constitutional:      Appearance: Normal appearance. She is not ill-appearing.  HENT:     Head: Normocephalic and atraumatic.  Cardiovascular:     Rate and Rhythm: Normal rate and regular rhythm.     Heart sounds: Normal heart sounds. No murmur heard.   No friction rub. No gallop.  Pulmonary:     Effort: Pulmonary effort is normal. No respiratory distress.     Breath sounds: Normal breath sounds. No stridor. No wheezing, rhonchi or rales.  Abdominal:     General: Abdomen is flat. Bowel sounds are normal. There is no distension.     Palpations: Abdomen is soft. There is no mass.     Tenderness: There is no abdominal tenderness. There is no guarding or rebound.     Hernia: No hernia is present.  Skin:    General: Skin is warm and dry.  Neurological:     Mental Status: She is alert and oriented to person, place, and time.     Assessment/Plan Impression: Sigmoid diverticulitis with colovesical fistula and involvement of the left ovary and uterus. Plan: Patient will undergo partial colectomy with possible colostomy, TAH-BSO by Dr. Despina Hidden, and repair of colovesical fistula.  The risks and benefits of all the procedures including bleeding, infection, cardiopulmonary difficulties, anastomotic leak, the possibility of a blood transfusion, and the possibility of a colostomy were fully  explained to the patient, who gave informed consent.  Franky Macho, MD 04/21/2021, 7:46 AM

## 2021-04-25 ENCOUNTER — Encounter: Payer: Self-pay | Admitting: General Surgery

## 2021-04-25 ENCOUNTER — Other Ambulatory Visit: Payer: Self-pay

## 2021-04-25 ENCOUNTER — Ambulatory Visit: Payer: Medicare Other | Admitting: General Surgery

## 2021-04-25 VITALS — BP 180/96 | HR 87 | Temp 98.3°F | Resp 14 | Ht 64.0 in | Wt 113.0 lb

## 2021-04-25 DIAGNOSIS — N321 Vesicointestinal fistula: Secondary | ICD-10-CM | POA: Diagnosis not present

## 2021-04-25 MED ORDER — METRONIDAZOLE 500 MG PO TABS: 500.0000 mg | ORAL_TABLET | Freq: Three times a day (TID) | ORAL | 0 refills | Status: DC

## 2021-04-25 MED ORDER — SUTAB 1479-225-188 MG PO TABS: 1.0000 | ORAL_TABLET | Freq: Once | ORAL | 0 refills | Status: AC

## 2021-04-25 MED ORDER — NEOMYCIN SULFATE 500 MG PO TABS: 1000.0000 mg | ORAL_TABLET | Freq: Three times a day (TID) | ORAL | 0 refills | Status: DC

## 2021-04-25 NOTE — Progress Notes (Signed)
Subjective:     Brandi Rowe  Patient here for preoperative visit.  She is scheduled for a partial colectomy with possible colostomy, repair of bladder fistula, and hysterectomy by Dr. Despina Hidden.  I did discuss with her that there was a possibility of a colostomy should I not be able to safely anastomosed the bowel.  She has had multiple CAT scans which show no colonic lesions, though she was not able to undergo a colonoscopy due to the active colovesical fistula. Objective:    BP (!) 180/96   Pulse 87   Temp 98.3 F (36.8 C) (Other (Comment))   Resp 14   Ht 5\' 4"  (1.626 m)   Wt 113 lb (51.3 kg)   SpO2 97%   BMI 19.40 kg/m   General:  alert, cooperative, and no distress       Assessment:    Colovesical fistula, preoperative discussion    Plan:   Patient is scheduled for a partial colectomy with possible colostomy, hysterectomy, and repair of colovesical fistula on 05/17/2021.  The risks and benefits of the procedure including bleeding, infection, anastomotic leak, the need to have a Foley placed for several weeks postoperatively, and the possibility of a colostomy were fully explained to the patient, who gave informed consent.  Sutabs, neomycin, and Flagyl have been ordered for bowel preparation.

## 2021-05-12 NOTE — Patient Instructions (Signed)
Your procedure is scheduled on: 05/17/2021  Report to [facility] Short Stay at   9:00  AM.  Call this number if you have problems the morning of surgery: [phone]   Remember:              Follow instructions from Dr Lovell Sheehan regarding Bowel prep and pre operative antibiotics   Do not Eat or Drink after midnight         No Smoking the morning of surgery  :  Take these medicines the morning of surgery with A SIP OF WATER: none   Do not wear jewelry, make-up or nail polish.  Do not wear lotions, powders, or perfumes. You may wear deodorant.  Do not shave 48 hours prior to surgery. Men may shave face and neck.  Do not bring valuables to the hospital.  Contacts, dentures or bridgework may not be worn into surgery.  Leave suitcase in the car. After surgery it may be brought to your room.  For patients admitted to the hospital, checkout time is 11:00 AM the day of discharge.   Patients discharged the day of surgery will not be allowed to drive home.    Special Instructions: Shower using CHG night before surgery and shower the day of surgery use CHG.  Use special wash - you have one bottle of CHG for all showers.  You should use approximately 1/2 of the bottle for each shower.  How to Use Chlorhexidine for Bathing Chlorhexidine gluconate (CHG) is a germ-killing (antiseptic) solution that is used to clean the skin. It can get rid of the bacteria that normally live on the skin and can keep them away for about 24 hours. To clean your skin with CHG, you may be given: A CHG solution to use in the shower or as part of a sponge bath. A prepackaged cloth that contains CHG. Cleaning your skin with CHG may help lower the risk for infection: While you are staying in the intensive care unit of the hospital. If you have a vascular access, such as a central line, to provide short-term or long-term access to your veins. If you have a catheter to drain urine from your bladder. If you are on a  ventilator. A ventilator is a machine that helps you breathe by moving air in and out of your lungs. After surgery. What are the risks? Risks of using CHG include: A skin reaction. Hearing loss, if CHG gets in your ears and you have a perforated eardrum. Eye injury, if CHG gets in your eyes and is not rinsed out. The CHG product catching fire. Make sure that you avoid smoking and flames after applying CHG to your skin. Do not use CHG: If you have a chlorhexidine allergy or have previously reacted to chlorhexidine. On babies younger than 74 months of age. How to use CHG solution Use CHG only as told by your health care provider, and follow the instructions on the label. Use the full amount of CHG as directed. Usually, this is one bottle. During a shower Follow these steps when using CHG solution during a shower (unless your health care provider gives you different instructions): Start the shower. Use your normal soap and shampoo to wash your face and hair. Turn off the shower or move out of the shower stream. Pour the CHG onto a clean washcloth. Do not use any type of brush or rough-edged sponge. Starting at your neck, lather your body down to your toes. Make sure you  follow these instructions: If you will be having surgery, pay special attention to the part of your body where you will be having surgery. Scrub this area for at least 1 minute. Do not use CHG on your head or face. If the solution gets into your ears or eyes, rinse them well with water. Avoid your genital area. Avoid any areas of skin that have broken skin, cuts, or scrapes. Scrub your back and under your arms. Make sure to wash skin folds. Let the lather sit on your skin for 1-2 minutes or as long as told by your health care provider. Thoroughly rinse your entire body in the shower. Make sure that all body creases and crevices are rinsed well. Dry off with a clean towel. Do not put any substances on your body afterward--such  as powder, lotion, or perfume--unless you are told to do so by your health care provider. Only use lotions that are recommended by the manufacturer. Put on clean clothes or pajamas. If it is the night before your surgery, sleep in clean sheets.  During a sponge bath Follow these steps when using CHG solution during a sponge bath (unless your health care provider gives you different instructions): Use your normal soap and shampoo to wash your face and hair. Pour the CHG onto a clean washcloth. Starting at your neck, lather your body down to your toes. Make sure you follow these instructions: If you will be having surgery, pay special attention to the part of your body where you will be having surgery. Scrub this area for at least 1 minute. Do not use CHG on your head or face. If the solution gets into your ears or eyes, rinse them well with water. Avoid your genital area. Avoid any areas of skin that have broken skin, cuts, or scrapes. Scrub your back and under your arms. Make sure to wash skin folds. Let the lather sit on your skin for 1-2 minutes or as long as told by your health care provider. Using a different clean, wet washcloth, thoroughly rinse your entire body. Make sure that all body creases and crevices are rinsed well. Dry off with a clean towel. Do not put any substances on your body afterward--such as powder, lotion, or perfume--unless you are told to do so by your health care provider. Only use lotions that are recommended by the manufacturer. Put on clean clothes or pajamas. If it is the night before your surgery, sleep in clean sheets. How to use CHG prepackaged cloths Only use CHG cloths as told by your health care provider, and follow the instructions on the label. Use the CHG cloth on clean, dry skin. Do not use the CHG cloth on your head or face unless your health care provider tells you to. When washing with the CHG cloth: Avoid your genital area. Avoid any areas of skin  that have broken skin, cuts, or scrapes. Before surgery Follow these steps when using a CHG cloth to clean before surgery (unless your health care provider gives you different instructions): Using the CHG cloth, vigorously scrub the part of your body where you will be having surgery. Scrub using a back-and-forth motion for 3 minutes. The area on your body should be completely wet with CHG when you are done scrubbing. Do not rinse. Discard the cloth and let the area air-dry. Do not put any substances on the area afterward, such as powder, lotion, or perfume. Put on clean clothes or pajamas. If it is the night before  your surgery, sleep in clean sheets.  For general bathing Follow these steps when using CHG cloths for general bathing (unless your health care provider gives you different instructions). Use a separate CHG cloth for each area of your body. Make sure you wash between any folds of skin and between your fingers and toes. Wash your body in the following order, switching to a new cloth after each step: The front of your neck, shoulders, and chest. Both of your arms, under your arms, and your hands. Your stomach and groin area, avoiding the genitals. Your right leg and foot. Your left leg and foot. The back of your neck, your back, and your buttocks. Do not rinse. Discard the cloth and let the area air-dry. Do not put any substances on your body afterward--such as powder, lotion, or perfume--unless you are told to do so by your health care provider. Only use lotions that are recommended by the manufacturer. Put on clean clothes or pajamas. Contact a health care provider if: Your skin gets irritated after scrubbing. You have questions about using your solution or cloth. You swallow any chlorhexidine. Call your local poison control center ([phone] in the [place] Get help right away if: Your eyes itch badly, or they become very red or swollen. Your skin itches badly and is red or  swollen. Your hearing changes. You have trouble seeing. You have swelling or tingling in your mouth or throat. You have trouble breathing. These symptoms may represent a serious problem that is an emergency. Do not wait to see if the symptoms will go away. Get medical help right away. Call your local emergency services (911 in the [place] Do not drive yourself to the hospital. Summary Chlorhexidine gluconate (CHG) is a germ-killing (antiseptic) solution that is used to clean the skin. Cleaning your skin with CHG may help to lower your risk for infection. You may be given CHG to use for bathing. It may be in a bottle or in a prepackaged cloth to use on your skin. Carefully follow your health care provider's instructions and the instructions on the product label. Do not use CHG if you have a chlorhexidine allergy. Contact your health care provider if your skin gets irritated after scrubbing. This information is not intended to replace advice given to you by your health care provider. Make sure you discuss any questions you have with your health care provider. Document Revised: 09/19/2020 Document Reviewed: 09/19/2020 Elsevier Patient Education  2022 [employer] Open Total Colectomy, Care After This sheet gives you information about how to care for yourself after your procedure. Your health care provider may also give you more specific instructions. If you have problems or questions, contact your health care provider. What can I expect after the procedure? After the procedure, it is common to have: Pain in your abdomen, especially along your incision. Tiredness. Your energy level will return to normal over the next several weeks. Constipation. Nausea. Difficulty urinating. Follow these instructions at home: Medicines Take over-the-counter and prescription medicines, including stool softeners, only as told by your health care provider. Ask your health care provider if the medicine prescribed  to you requires you to avoid driving or using machinery. Eating and drinking Follow instructions from your health care provider about eating or drinking restrictions. Eat a low-fiber diet for the first 4 weeks after surgery. Most people on a low-fiber eating plan should eat less than 10 grams (g) of fiber a day. Follow recommendations from your health care provider or dietitian  about how much fiber you should have each day. Always check food labels to know the fiber content of packaged foods. In general, a low-fiber food will have fewer than 2 g of fiber a serving. In general, try to avoid whole grains, raw fruits and vegetables, dried fruit, tough cuts of meat, nuts, and seeds. Incision care  Follow instructions from your health care provider about how to take care of your incision. Make sure you: Wash your hands with soap and water for at least 20 seconds before and after you change your bandage (dressing). If soap and water are not available, use hand sanitizer. Change your dressing as told by your health care provider. Leave stitches (sutures), staples, or adhesive strips in place. These skin closures may need to stay in place for 2 weeks or longer. If adhesive strip edges start to loosen and curl up, you may trim the loose edges. Do not remove adhesive strips completely unless your health care provider tells you to do that. Avoid wearing tight clothing around your incision. Protect your incision area from the sun. Do not take baths, swim, or use a hot tub until your health care provider approves. Ask your health care provider if you may take showers. You may only be allowed to take sponge baths. Check your incision area every day for signs of infection. Check for: More redness, swelling, or pain. Fluid or blood. Warmth. Pus or a bad smell. Managing constipation Your procedure may cause constipation. To prevent or treat constipation, you may need to: Drink enough fluid to keep your urine  pale yellow. Take over-the-counter or prescription medicines. Activity  Rest as told by your health care provider. Avoid sitting for a long time without moving. Get up to take short walks every 1-2 hours. This is important to improve blood flow and breathing. Ask for help if you feel weak or unsteady. Avoid activities that require a lot of energy for 4-6 weeks after surgery, such as running, climbing, and lifting heavy objects. You may need to continue to do breathing exercises. Do not lift anything that is heavier than 10 lb (4.5 kg), or the limit that you are told, until your health care provider says that it is safe. Do not drive until you are no longer taking prescription pain medicines and you feel you are able to drive safely. This might take up to 4 weeks. Return to your normal activities as told by your health care provider. Ask your health care provider what activities are safe for you. General instructions If you have an opening (stoma) in your abdomen, follow instructions from your health care provider about how to care for the stoma and the external pouch attached to it (ostomy pouch). Do not use any products that contain nicotine or tobacco, such as cigarettes, e-cigarettes, and chewing tobacco. These can delay incision healing after surgery. If you need help quitting, ask your health care provider. Keep all follow-up visits as told by your health care provider. This is important. Contact a health care provider if: You have any of these signs of infection: More redness, swelling, or pain around your incision. Fluid or blood coming from your incision. Warmth coming from your incision. Pus or a bad smell coming from your incision. A fever or chills. You do not have a bowel movement within 2-3 days after surgery. You cannot eat or drink for 24 hours or longer. You have nausea and vomiting that will not go away. You have pain in your abdomen  that gets worse and does not get better  with medicine. Get help right away if: You have chest pain. You have shortness of breath. You have pain or swelling in your legs. Your incision breaks open after your sutures, staples, or adhesive strips have been removed. You have bleeding from the rectum. These symptoms may represent a serious problem that is an emergency. Do not wait to see if the symptoms will go away. Get medical help right away. Call your local emergency services (911 in the [place] Do not drive yourself to the hospital. Summary After your procedure, it is common to have pain, tiredness, constipation, nausea, and difficulty urinating. If you have an opening (stoma) in your abdomen, follow instructions from your health care provider about how to care for the stoma and the external pouch attached to it (ostomy pouch). Follow instructions from your health care provider about eating and drinking and about how to take care of your incision. Keep all follow-up visits as told by your health care provider. This is important. This information is not intended to replace advice given to you by your health care provider. Make sure you discuss any questions you have with your health care provider. Document Revised: 06/17/2019 Document Reviewed: 06/17/2019 Elsevier Patient Education  2022 [employer] Abdominal Hysterectomy, Care After The following information offers guidance on how to care for yourself after your procedure. Your doctor may also give you more specific instructions. If you have problems or questions, contact your doctor. What can I expect after the procedure? After the procedure, it is common to have: Pain. Tiredness. No desire to eat. Less interest in sex. Bleeding and fluid (discharge) from your vagina. You may need to use a pad after this procedure. Trouble pooping (constipation). Feelings of sadness or other emotions. Follow these instructions at home: Medicines Take over-the-counter and prescription  medicines only as told by your doctor. If you were prescribed an antibiotic medicine, take it as told by your doctor. Do not stop using the antibiotic even if you start to feel better. If told, take steps to prevent problems with pooping (constipation). You may need to: Drink enough fluid to keep your pee (urine) pale yellow. Take medicines. You will be told what medicines to take. Eat foods that are high in fiber. These include beans, whole grains, and fresh fruits and vegetables. Limit foods that are high in fat and sugar. These include fried or sweet foods. Ask your doctor if you should avoid driving or using machines while you are taking your medicine. Surgical cut care  Follow instructions from your doctor about how to take care of your cut from surgery (incision). Make sure you: Wash your hands with soap and water for at least 20 seconds before and after you change your bandage. If you cannot use soap and water, use hand sanitizer. Change your bandage. Leave stitches or skin glue in place for at least two weeks. Leave tape strips alone unless you are told to take them off. You may trim the edges of the tape strips if they curl up. Keep the bandage dry until your doctor says it can be taken off. Check your incision every day for signs of infection. Check for: More redness, swelling, or pain. Fluid or blood. Warmth. Pus or a bad smell. Activity  Rest as told by your doctor. Get up to take short walks every 1 to 2 hours. Ask for help if you feel weak or unsteady. Do not lift anything that is heavier  than 10 lb (4.5 kg), or the limit that you are told. Follow your doctor's advice about exercise, driving, and general activities. Return to your normal activities when your doctor says that it is safe. Lifestyle Do not douche, use tampons, or have sex for at least 6 weeks or as told by your doctor. Do not drink alcohol until your doctor says it is okay. Do not smoke or use any products  that contain nicotine or tobacco. These can delay healing after surgery. If you need help quitting, ask your doctor. General instructions Do not take baths, swim, or use a hot tub. Ask your doctor about taking showers or sponge baths. Try to have a responsible adult at home with you for the first 1-2 weeks to help with your daily chores. Wear tight-fitting (compression) stockings as told by your doctor. Keep all follow-up visits. Contact a doctor if: You have chills or a fever. You have any of these signs of infection around your cut: More redness, swelling, or pain. Fluid or blood. Warmth. Pus or a bad smell. Your cut breaks open. You feel dizzy or light-headed. You have pain or bleeding when you pee. You keep having watery poop (diarrhea). You keep feeling like you may vomit or you keep vomiting. You have fluid coming from your vagina that is not normal. You have any type of reaction to your medicine that is not normal, like a rash, or you develop an allergy to your medicine. Your pain medicine does not help. Get help right away if: You have a fever and your symptoms get worse suddenly. You have very bad pain in your belly (abdomen). You are short of breath. You faint. You have pain, swelling, or redness of your leg. You bleed a lot from your vagina and you see blood clots. Summary It is normal to have some pain, tiredness, and fluid that comes from your vagina. Do not take baths, swim, or use a hot tub. Ask your doctor about taking showers or sponge baths. Do not lift anything that is heavier than 10 lb (4.5 kg), or the limit that you are told. Follow your doctor's advice about exercise, driving, and general activities. Try to have a responsible adult at home with you for the first 1-2 weeks to help with your daily chores. This information is not intended to replace advice given to you by your health care provider. Make sure you discuss any questions you have with your health  care provider. Document Revised: 09/16/2020 Document Reviewed: 03/10/2020 Elsevier Patient Education  2022 [employer] General Anesthesia, Adult, Care After This sheet gives you information about how to care for yourself after your procedure. Your health care provider may also give you more specific instructions. If you have problems or questions, contact your health care provider. What can I expect after the procedure? After the procedure, the following side effects are common: Pain or discomfort at the IV site. Nausea. Vomiting. Sore throat. Trouble concentrating. Feeling cold or chills. Feeling weak or tired. Sleepiness and fatigue. Soreness and body aches. These side effects can affect parts of the body that were not involved in surgery. Follow these instructions at home: For the time period you were told by your health care provider:  Rest. Do not participate in activities where you could fall or become injured. Do not drive or use machinery. Do not drink alcohol. Do not take sleeping pills or medicines that cause drowsiness. Do not make important decisions or sign legal documents. Do not  take care of children on your own. Eating and drinking Follow any instructions from your health care provider about eating or drinking restrictions. When you feel hungry, start by eating small amounts of foods that are soft and easy to digest (bland), such as toast. Gradually return to your regular diet. Drink enough fluid to keep your urine pale yellow. If you vomit, rehydrate by drinking water, juice, or clear broth. General instructions If you have sleep apnea, surgery and certain medicines can increase your risk for breathing problems. Follow instructions from your health care provider about wearing your sleep device: Anytime you are sleeping, including during daytime naps. While taking prescription pain medicines, sleeping medicines, or medicines that make you drowsy. Have a  responsible adult stay with you for the time you are told. It is important to have someone help care for you until you are awake and alert. Return to your normal activities as told by your health care provider. Ask your health care provider what activities are safe for you. Take over-the-counter and prescription medicines only as told by your health care provider. If you smoke, do not smoke without supervision. Keep all follow-up visits as told by your health care provider. This is important. Contact a health care provider if: You have nausea or vomiting that does not get better with medicine. You cannot eat or drink without vomiting. You have pain that does not get better with medicine. You are unable to pass urine. You develop a skin rash. You have a fever. You have redness around your IV site that gets worse. Get help right away if: You have difficulty breathing. You have chest pain. You have blood in your urine or stool, or you vomit blood. Summary After the procedure, it is common to have a sore throat or nausea. It is also common to feel tired. Have a responsible adult stay with you for the time you are told. It is important to have someone help care for you until you are awake and alert. When you feel hungry, start by eating small amounts of foods that are soft and easy to digest (bland), such as toast. Gradually return to your regular diet. Drink enough fluid to keep your urine pale yellow. Return to your normal activities as told by your health care provider. Ask your health care provider what activities are safe for you. This information is not intended to replace advice given to you by your health care provider. Make sure you discuss any questions you have with your health care provider. Document Revised: 03/24/2020 Document Reviewed: 10/22/2019 Elsevier Patient Education  2022 [employer].

## 2021-05-15 ENCOUNTER — Encounter (HOSPITAL_COMMUNITY): Payer: Self-pay

## 2021-05-15 ENCOUNTER — Encounter (HOSPITAL_COMMUNITY)
Admission: RE | Admit: 2021-05-15 | Discharge: 2021-05-15 | Disposition: A | Discharge status: Home / Self Care | Payer: Medicare Other | Source: Ambulatory Visit | Attending: General Surgery | Admitting: General Surgery

## 2021-05-15 ENCOUNTER — Other Ambulatory Visit (HOSPITAL_COMMUNITY)
Admission: RE | Admit: 2021-05-15 | Discharge: 2021-05-15 | Disposition: A | Discharge status: Home / Self Care | Payer: Medicare Other | Source: Ambulatory Visit | Attending: General Surgery | Admitting: General Surgery

## 2021-05-15 VITALS — BP 177/96 | HR 91 | Temp 98.7°F | Resp 18 | Ht 64.0 in | Wt 113.0 lb

## 2021-05-15 DIAGNOSIS — Z01818 Encounter for other preprocedural examination: Secondary | ICD-10-CM | POA: Insufficient documentation

## 2021-05-15 DIAGNOSIS — Z20822 Contact with and (suspected) exposure to covid-19: Secondary | ICD-10-CM | POA: Insufficient documentation

## 2021-05-15 HISTORY — DX: Anxiety disorder, unspecified: F41.9

## 2021-05-15 LAB — BASIC METABOLIC PANEL
Anion gap: 8 (ref 5–15)
BUN: 21 mg/dL (ref 8–23)
CO2: 27 mmol/L (ref 22–32)
Calcium: 9.9 mg/dL (ref 8.9–10.3)
Chloride: 101 mmol/L (ref 98–111)
Creatinine, Ser: 0.77 mg/dL (ref 0.44–1.00)
GFR, Estimated: >60 mL/min (ref >60)
Glucose, Bld: 99 mg/dL (ref 70–99)
Potassium: 4.4 mmol/L (ref 3.5–5.1)
Sodium: 136 mmol/L (ref 135–145)

## 2021-05-15 LAB — CBC WITH DIFFERENTIAL/PLATELET
Abs Immature Granulocytes: 0.01 10*3/uL (ref 0.00–0.07)
Basophils Absolute: 0.1 10*3/uL (ref 0.0–0.1)
Basophils Relative: 1 %
Eosinophils Absolute: 0.3 10*3/uL (ref 0.0–0.5)
Eosinophils Relative: 4 %
HCT: 46.7 % — ABNORMAL HIGH (ref 36.0–46.0)
Hemoglobin: 16 g/dL — ABNORMAL HIGH (ref 12.0–15.0)
Immature Granulocytes: 0 %
Lymphocytes Relative: 19 %
Lymphs Abs: 1.6 10*3/uL (ref 0.7–4.0)
MCH: 34 pg (ref 26.0–34.0)
MCHC: 34.3 g/dL (ref 30.0–36.0)
MCV: 99.2 fL (ref 80.0–100.0)
Monocytes Absolute: 0.6 10*3/uL (ref 0.1–1.0)
Monocytes Relative: 7 %
Neutro Abs: 5.9 10*3/uL (ref 1.7–7.7)
Neutrophils Relative %: 69 %
Platelets: 239 10*3/uL (ref 150–400)
RBC: 4.71 MIL/uL (ref 3.87–5.11)
RDW: 13.8 % (ref 11.5–15.5)
WBC: 8.5 10*3/uL (ref 4.0–10.5)
nRBC: 0 % (ref 0.0–0.2)

## 2021-05-15 LAB — SARS CORONAVIRUS 2 (TAT 6-24 HRS): SARS Coronavirus 2: NEGATIVE

## 2021-05-15 LAB — TYPE AND SCREEN
ABO/RH(D): B POS
Antibody Screen: NEGATIVE

## 2021-05-16 ENCOUNTER — Other Ambulatory Visit: Payer: Self-pay | Admitting: Obstetrics & Gynecology

## 2021-05-17 ENCOUNTER — Encounter (HOSPITAL_COMMUNITY)
Admission: RE | Disposition: A | Discharge status: Home / Self Care | Payer: Self-pay | Source: Home / Self Care | Attending: General Surgery

## 2021-05-17 ENCOUNTER — Other Ambulatory Visit: Payer: Self-pay

## 2021-05-17 ENCOUNTER — Inpatient Hospital Stay (HOSPITAL_COMMUNITY): Payer: Medicare Other | Admitting: Anesthesiology

## 2021-05-17 ENCOUNTER — Inpatient Hospital Stay (HOSPITAL_COMMUNITY)
Admission: RE | Admit: 2021-05-17 | Discharge: 2021-05-20 | DRG: 330 | Disposition: A | Discharge status: Home / Self Care | Payer: Medicare Other | Attending: General Surgery | Admitting: General Surgery

## 2021-05-17 ENCOUNTER — Encounter (HOSPITAL_COMMUNITY): Payer: Self-pay | Admitting: General Surgery

## 2021-05-17 DIAGNOSIS — K660 Peritoneal adhesions (postprocedural) (postinfection): Secondary | ICD-10-CM | POA: Diagnosis present

## 2021-05-17 DIAGNOSIS — Z20822 Contact with and (suspected) exposure to covid-19: Secondary | ICD-10-CM | POA: Diagnosis present

## 2021-05-17 DIAGNOSIS — K5732 Diverticulitis of large intestine without perforation or abscess without bleeding: Secondary | ICD-10-CM | POA: Diagnosis present

## 2021-05-17 DIAGNOSIS — N321 Vesicointestinal fistula: Secondary | ICD-10-CM

## 2021-05-17 DIAGNOSIS — M109 Gout, unspecified: Secondary | ICD-10-CM | POA: Diagnosis present

## 2021-05-17 DIAGNOSIS — R3989 Other symptoms and signs involving the genitourinary system: Secondary | ICD-10-CM | POA: Diagnosis present

## 2021-05-17 DIAGNOSIS — D62 Acute posthemorrhagic anemia: Secondary | ICD-10-CM | POA: Diagnosis not present

## 2021-05-17 DIAGNOSIS — Z8049 Family history of malignant neoplasm of other genital organs: Secondary | ICD-10-CM | POA: Diagnosis not present

## 2021-05-17 DIAGNOSIS — N898 Other specified noninflammatory disorders of vagina: Secondary | ICD-10-CM | POA: Diagnosis present

## 2021-05-17 DIAGNOSIS — N739 Female pelvic inflammatory disease, unspecified: Secondary | ICD-10-CM | POA: Diagnosis present

## 2021-05-17 DIAGNOSIS — Z8249 Family history of ischemic heart disease and other diseases of the circulatory system: Secondary | ICD-10-CM

## 2021-05-17 DIAGNOSIS — Z800 Family history of malignant neoplasm of digestive organs: Secondary | ICD-10-CM | POA: Diagnosis not present

## 2021-05-17 DIAGNOSIS — Z933 Colostomy status: Secondary | ICD-10-CM

## 2021-05-17 DIAGNOSIS — F17210 Nicotine dependence, cigarettes, uncomplicated: Secondary | ICD-10-CM | POA: Diagnosis present

## 2021-05-17 DIAGNOSIS — Z823 Family history of stroke: Secondary | ICD-10-CM

## 2021-05-17 DIAGNOSIS — Z9109 Other allergy status, other than to drugs and biological substances: Secondary | ICD-10-CM | POA: Diagnosis not present

## 2021-05-17 DIAGNOSIS — F419 Anxiety disorder, unspecified: Secondary | ICD-10-CM | POA: Diagnosis present

## 2021-05-17 HISTORY — PX: PELVIC LYMPH NODE DISSECTION: SHX6543

## 2021-05-17 HISTORY — PX: PARTIAL COLECTOMY: SHX5273

## 2021-05-17 HISTORY — PX: BLADDER REPAIR: SHX6721

## 2021-05-17 LAB — ABO/RH: ABO/RH(D): B POS

## 2021-05-17 LAB — HEMOGLOBIN AND HEMATOCRIT, BLOOD
HCT: 40.5 % (ref 36.0–46.0)
Hemoglobin: 13.8 g/dL (ref 12.0–15.0)

## 2021-05-17 SURGERY — COLECTOMY, PARTIAL
Anesthesia: General | Site: Abdomen

## 2021-05-17 SURGERY — HYSTERECTOMY, SUPRACERVICAL, ABDOMINAL
Anesthesia: General

## 2021-05-17 MED ORDER — SODIUM CHLORIDE 0.9 % IV SOLN
1.0000 g | INTRAVENOUS | Status: AC
  Administered 2021-05-17: 1 g via INTRAVENOUS
  Filled 2021-05-17: qty 1

## 2021-05-17 MED ORDER — DIPHENHYDRAMINE HCL 50 MG/ML IJ SOLN: 12.5000 mg | Freq: Four times a day (QID) | INTRAMUSCULAR | Status: DC | PRN

## 2021-05-17 MED ORDER — 0.9 % SODIUM CHLORIDE (POUR BTL) OPTIME
TOPICAL | Status: DC | PRN
  Administered 2021-05-17 (×2): 1000 mL

## 2021-05-17 MED ORDER — ENOXAPARIN SODIUM 40 MG/0.4ML IJ SOSY
40.0000 mg | PREFILLED_SYRINGE | INTRAMUSCULAR | Status: DC
  Administered 2021-05-18 – 2021-05-19 (×2): 40 mg via SUBCUTANEOUS
  Filled 2021-05-17 (×3): qty 0.4

## 2021-05-17 MED ORDER — BUPIVACAINE HCL (300 MG DOSE) 3 X 100 MG IL IMPL
DRUG_IMPLANT | Status: AC
  Filled 2021-05-17: qty 100

## 2021-05-17 MED ORDER — KETAMINE HCL 10 MG/ML IJ SOLN
INTRAMUSCULAR | Status: DC | PRN
  Administered 2021-05-17: 10 mg via INTRAVENOUS
  Administered 2021-05-17: 20 mg via INTRAVENOUS

## 2021-05-17 MED ORDER — MIDAZOLAM HCL 2 MG/2ML IJ SOLN
INTRAMUSCULAR | Status: AC
  Filled 2021-05-17: qty 2

## 2021-05-17 MED ORDER — ONDANSETRON HCL 4 MG/2ML IJ SOLN
INTRAMUSCULAR | Status: DC | PRN
  Administered 2021-05-17: 4 mg via INTRAVENOUS

## 2021-05-17 MED ORDER — ONDANSETRON 4 MG PO TBDP: 4.0000 mg | ORAL_TABLET | Freq: Four times a day (QID) | ORAL | Status: DC | PRN

## 2021-05-17 MED ORDER — POVIDONE-IODINE 10 % EX OINT
TOPICAL_OINTMENT | CUTANEOUS | Status: AC
  Filled 2021-05-17: qty 1

## 2021-05-17 MED ORDER — SUCCINYLCHOLINE CHLORIDE 200 MG/10ML IV SOSY
PREFILLED_SYRINGE | INTRAVENOUS | Status: DC | PRN
  Administered 2021-05-17: 100 mg via INTRAVENOUS

## 2021-05-17 MED ORDER — SODIUM CHLORIDE 0.9 % IV SOLN: INTRAVENOUS | Status: DC

## 2021-05-17 MED ORDER — LACTATED RINGERS IV BOLUS: 1000.0000 mL | Freq: Once | INTRAVENOUS | Status: DC

## 2021-05-17 MED ORDER — HEMOSTATIC AGENTS (NO CHARGE) OPTIME
TOPICAL | Status: DC | PRN
  Administered 2021-05-17 (×3): 1 via TOPICAL

## 2021-05-17 MED ORDER — ALUM & MAG HYDROXIDE-SIMETH 200-200-20 MG/5ML PO SUSP
30.0000 mL | Freq: Four times a day (QID) | ORAL | Status: DC | PRN
  Administered 2021-05-17 – 2021-05-18 (×3): 30 mL via ORAL
  Filled 2021-05-17 (×3): qty 30

## 2021-05-17 MED ORDER — PHENYLEPHRINE HCL (PRESSORS) 10 MG/ML IV SOLN
INTRAVENOUS | Status: DC | PRN
  Administered 2021-05-17: 80 ug via INTRAVENOUS

## 2021-05-17 MED ORDER — HYDROCODONE-ACETAMINOPHEN 5-325 MG PO TABS
1.0000 | ORAL_TABLET | ORAL | Status: DC | PRN
  Administered 2021-05-20: 1 via ORAL
  Filled 2021-05-17: qty 1

## 2021-05-17 MED ORDER — DIPHENHYDRAMINE HCL 12.5 MG/5ML PO ELIX: 12.5000 mg | ORAL_SOLUTION | Freq: Four times a day (QID) | ORAL | Status: DC | PRN

## 2021-05-17 MED ORDER — ALVIMOPAN 12 MG PO CAPS
12.0000 mg | ORAL_CAPSULE | ORAL | Status: AC
Start: 2021-05-17 — End: 2021-05-17
  Administered 2021-05-17: 12 mg via ORAL
  Filled 2021-05-17: qty 1

## 2021-05-17 MED ORDER — ROCURONIUM BROMIDE 100 MG/10ML IV SOLN
INTRAVENOUS | Status: DC | PRN
  Administered 2021-05-17: 20 mg via INTRAVENOUS
  Administered 2021-05-17: 80 mg via INTRAVENOUS

## 2021-05-17 MED ORDER — LACTATED RINGERS IV SOLN: INTRAVENOUS | Status: DC

## 2021-05-17 MED ORDER — KETOROLAC TROMETHAMINE 15 MG/ML IJ SOLN
15.0000 mg | Freq: Four times a day (QID) | INTRAMUSCULAR | Status: AC
  Administered 2021-05-17: 15 mg via INTRAVENOUS
  Filled 2021-05-17: qty 1

## 2021-05-17 MED ORDER — CHLORHEXIDINE GLUCONATE 0.12 % MT SOLN
15.0000 mL | Freq: Once | OROMUCOSAL | Status: AC
  Administered 2021-05-17: 15 mL via OROMUCOSAL
  Filled 2021-05-17: qty 15

## 2021-05-17 MED ORDER — KETOROLAC TROMETHAMINE 15 MG/ML IJ SOLN
15.0000 mg | Freq: Four times a day (QID) | INTRAMUSCULAR | Status: DC | PRN
  Administered 2021-05-18 – 2021-05-19 (×3): 15 mg via INTRAVENOUS
  Filled 2021-05-17 (×3): qty 1

## 2021-05-17 MED ORDER — CHLORHEXIDINE GLUCONATE CLOTH 2 % EX PADS: 6.0000 | MEDICATED_PAD | Freq: Once | CUTANEOUS | Status: DC

## 2021-05-17 MED ORDER — PROMETHAZINE HCL 25 MG/ML IJ SOLN
INTRAMUSCULAR | Status: AC
  Filled 2021-05-17: qty 1

## 2021-05-17 MED ORDER — HYDROMORPHONE HCL 1 MG/ML IJ SOLN
1.0000 mg | INTRAMUSCULAR | Status: DC | PRN
  Administered 2021-05-18 – 2021-05-19 (×4): 1 mg via INTRAVENOUS
  Filled 2021-05-17 (×4): qty 1

## 2021-05-17 MED ORDER — PROMETHAZINE HCL 25 MG/ML IJ SOLN
6.2500 mg | Freq: Once | INTRAMUSCULAR | Status: AC
  Administered 2021-05-17: 6.25 mg via INTRAVENOUS

## 2021-05-17 MED ORDER — ACETAMINOPHEN 325 MG PO TABS: 650.0000 mg | ORAL_TABLET | Freq: Four times a day (QID) | ORAL | Status: DC | PRN

## 2021-05-17 MED ORDER — ENOXAPARIN SODIUM 40 MG/0.4ML IJ SOSY
40.0000 mg | PREFILLED_SYRINGE | Freq: Once | INTRAMUSCULAR | Status: AC
  Administered 2021-05-17: 40 mg via SUBCUTANEOUS
  Filled 2021-05-17: qty 0.4

## 2021-05-17 MED ORDER — LORAZEPAM 2 MG/ML IJ SOLN: 1.0000 mg | INTRAMUSCULAR | Status: DC | PRN

## 2021-05-17 MED ORDER — POLYVINYL ALCOHOL 1.4 % OP SOLN: 1.0000 [drp] | Freq: Three times a day (TID) | OPHTHALMIC | Status: DC | PRN

## 2021-05-17 MED ORDER — ONDANSETRON HCL 4 MG/2ML IJ SOLN: 4.0000 mg | Freq: Four times a day (QID) | INTRAMUSCULAR | Status: DC | PRN

## 2021-05-17 MED ORDER — ONDANSETRON HCL 4 MG/2ML IJ SOLN
4.0000 mg | Freq: Once | INTRAMUSCULAR | Status: AC | PRN
  Administered 2021-05-17: 4 mg via INTRAVENOUS
  Filled 2021-05-17: qty 2

## 2021-05-17 MED ORDER — ORAL CARE MOUTH RINSE: 15.0000 mL | Freq: Once | OROMUCOSAL | Status: AC

## 2021-05-17 MED ORDER — ACETAMINOPHEN 650 MG RE SUPP: 650.0000 mg | Freq: Four times a day (QID) | RECTAL | Status: DC | PRN

## 2021-05-17 MED ORDER — STERILE WATER FOR IRRIGATION IR SOLN
Status: DC | PRN
  Administered 2021-05-17: 300 mL via INTRAVESICAL

## 2021-05-17 MED ORDER — FENTANYL CITRATE (PF) 100 MCG/2ML IJ SOLN
INTRAMUSCULAR | Status: DC | PRN
  Administered 2021-05-17 (×3): 50 ug via INTRAVENOUS
  Administered 2021-05-17: 100 ug via INTRAVENOUS
  Administered 2021-05-17 (×2): 25 ug via INTRAVENOUS
  Administered 2021-05-17 (×2): 50 ug via INTRAVENOUS

## 2021-05-17 MED ORDER — HYDROMORPHONE HCL 1 MG/ML IJ SOLN
0.2500 mg | INTRAMUSCULAR | Status: DC | PRN
  Administered 2021-05-17 (×2): 0.5 mg via INTRAVENOUS
  Filled 2021-05-17 (×2): qty 0.5

## 2021-05-17 MED ORDER — BUPIVACAINE LIPOSOME 1.3 % IJ SUSP
INTRAMUSCULAR | Status: DC | PRN
  Administered 2021-05-17: 20 mL

## 2021-05-17 MED ORDER — SIMETHICONE 80 MG PO CHEW: 40.0000 mg | CHEWABLE_TABLET | Freq: Four times a day (QID) | ORAL | Status: DC | PRN

## 2021-05-17 MED ORDER — BUPIVACAINE LIPOSOME 1.3 % IJ SUSP
INTRAMUSCULAR | Status: AC
  Filled 2021-05-17: qty 20

## 2021-05-17 MED ORDER — MIDAZOLAM HCL 2 MG/2ML IJ SOLN
INTRAMUSCULAR | Status: DC | PRN
  Administered 2021-05-17: 2 mg via INTRAVENOUS

## 2021-05-17 MED ORDER — PROPOFOL 10 MG/ML IV BOLUS
INTRAVENOUS | Status: AC
  Filled 2021-05-17: qty 20

## 2021-05-17 MED ORDER — MIDAZOLAM HCL 2 MG/2ML IJ SOLN: 2.0000 mg | Freq: Once | INTRAMUSCULAR | Status: DC

## 2021-05-17 MED ORDER — CHLORHEXIDINE GLUCONATE CLOTH 2 % EX PADS
6.0000 | MEDICATED_PAD | Freq: Every day | CUTANEOUS | Status: DC
  Administered 2021-05-19 – 2021-05-20 (×2): 6 via TOPICAL

## 2021-05-17 MED ORDER — PROPOFOL 10 MG/ML IV BOLUS
INTRAVENOUS | Status: DC | PRN
Start: 2021-05-17 — End: 2021-05-17
  Administered 2021-05-17 (×2): 50 mg via INTRAVENOUS
  Administered 2021-05-17: 150 mg via INTRAVENOUS

## 2021-05-17 MED ORDER — LIDOCAINE HCL (CARDIAC) PF 50 MG/5ML IV SOSY
PREFILLED_SYRINGE | INTRAVENOUS | Status: DC | PRN
  Administered 2021-05-17: 100 mg via INTRAVENOUS

## 2021-05-17 MED ORDER — TRAMADOL HCL 50 MG PO TABS
50.0000 mg | ORAL_TABLET | Freq: Four times a day (QID) | ORAL | Status: DC | PRN
  Administered 2021-05-18 – 2021-05-19 (×2): 50 mg via ORAL
  Filled 2021-05-17 (×2): qty 1

## 2021-05-17 MED ORDER — LIDOCAINE HCL (PF) 2 % IJ SOLN
INTRAMUSCULAR | Status: AC
  Filled 2021-05-17: qty 5

## 2021-05-17 MED ORDER — FENTANYL CITRATE (PF) 250 MCG/5ML IJ SOLN
INTRAMUSCULAR | Status: AC
  Filled 2021-05-17: qty 5

## 2021-05-17 MED ORDER — DEXAMETHASONE SODIUM PHOSPHATE 10 MG/ML IJ SOLN
INTRAMUSCULAR | Status: DC | PRN
  Administered 2021-05-17: 10 mg via INTRAVENOUS

## 2021-05-17 MED ORDER — ALVIMOPAN 12 MG PO CAPS
12.0000 mg | ORAL_CAPSULE | Freq: Two times a day (BID) | ORAL | Status: DC
  Administered 2021-05-18: 12 mg via ORAL
  Filled 2021-05-17: qty 1

## 2021-05-17 MED ORDER — SUGAMMADEX SODIUM 500 MG/5ML IV SOLN
INTRAVENOUS | Status: DC | PRN
  Administered 2021-05-17: 200 mg via INTRAVENOUS

## 2021-05-17 SURGICAL SUPPLY — 93 items
ADH SKN CLS APL DERMABOND .7 (GAUZE/BANDAGES/DRESSINGS) ×3
APL SWBSTK 6 STRL LF DISP (MISCELLANEOUS) ×3
APPLICATOR COTTON TIP 6 STRL (MISCELLANEOUS) ×3 IMPLANT
APPLICATOR COTTON TIP 6IN STRL (MISCELLANEOUS) ×4
APPLIER CLIP 13 LRG OPEN (CLIP) ×4
APR CLP LRG 13 20 CLIP (CLIP) ×3
BAG HAMPER (MISCELLANEOUS) ×4 IMPLANT
BLADE SURG SZ10 CARB STEEL (BLADE) ×4 IMPLANT
CLIP APPLIE 13 LRG OPEN (CLIP) ×2 IMPLANT
CLOTH BEACON ORANGE TIMEOUT ST (SAFETY) ×4 IMPLANT
COVER LIGHT HANDLE STERIS (MISCELLANEOUS) ×18 IMPLANT
DERMABOND ADVANCED (GAUZE/BANDAGES/DRESSINGS) ×1
DERMABOND ADVANCED .7 DNX12 (GAUZE/BANDAGES/DRESSINGS) ×3 IMPLANT
DRAPE WARM FLUID 44X44 (DRAPES) ×4 IMPLANT
DRSG OPSITE 4X5.5 SM (GAUZE/BANDAGES/DRESSINGS) ×3 IMPLANT
DRSG OPSITE POSTOP 4X8 (GAUZE/BANDAGES/DRESSINGS) ×13 IMPLANT
ELECT BLADE 6 FLAT ULTRCLN (ELECTRODE) ×3 IMPLANT
ELECT REM PT RETURN 9FT ADLT (ELECTROSURGICAL) ×8
ELECTRODE REM PT RTRN 9FT ADLT (ELECTROSURGICAL) ×6 IMPLANT
GAUZE 4X4 16PLY ~~LOC~~+RFID DBL (SPONGE) ×4 IMPLANT
GLOVE ECLIPSE 8.0 STRL XLNG CF (GLOVE) ×4 IMPLANT
GLOVE SRG 8 PF TXTR STRL LF DI (GLOVE) ×5 IMPLANT
GLOVE SURG ENC MOIS LTX SZ6.5 (GLOVE) ×8 IMPLANT
GLOVE SURG LTX SZ6.5 (GLOVE) ×3 IMPLANT
GLOVE SURG LTX SZ7 (GLOVE) ×6 IMPLANT
GLOVE SURG POLYISO LF SZ6.5 (GLOVE) ×3 IMPLANT
GLOVE SURG POLYISO LF SZ7.5 (GLOVE) ×8 IMPLANT
GLOVE SURG POLYISO LF SZ8 (GLOVE) ×3 IMPLANT
GLOVE SURG UNDER POLY LF SZ6.5 (GLOVE) ×8 IMPLANT
GLOVE SURG UNDER POLY LF SZ7 (GLOVE) ×36 IMPLANT
GLOVE SURG UNDER POLY LF SZ8 (GLOVE) ×8
GOWN STRL REUS W/TWL LRG LVL3 (GOWN DISPOSABLE) ×35 IMPLANT
GOWN STRL REUS W/TWL XL LVL3 (GOWN DISPOSABLE) ×4 IMPLANT
HEMOSTAT ARISTA ABSORB 3G PWDR (HEMOSTASIS) ×3 IMPLANT
HEMOSTAT SURGICEL 4X8 (HEMOSTASIS) ×3 IMPLANT
INST SET MAJOR GENERAL (KITS) ×5 IMPLANT
KIT BLADEGUARD II DBL (SET/KITS/TRAYS/PACK) ×4 IMPLANT
KIT COLOSTOMY ILEOSTOMY 4 (WOUND CARE) ×3 IMPLANT
KIT TURNOVER KIT A (KITS) ×5 IMPLANT
LIGASURE IMPACT 36 18CM CVD LR (INSTRUMENTS) ×4 IMPLANT
MANIFOLD NEPTUNE II (INSTRUMENTS) ×5 IMPLANT
NDL HYPO 18GX1.5 BLUNT FILL (NEEDLE) ×2 IMPLANT
NDL HYPO 21X1.5 SAFETY (NEEDLE) ×1 IMPLANT
NEEDLE HYPO 18GX1.5 BLUNT FILL (NEEDLE) ×8 IMPLANT
NEEDLE HYPO 21X1.5 SAFETY (NEEDLE) ×4 IMPLANT
NEEDLE HYPO 22GX1.5 SAFETY (NEEDLE) ×3 IMPLANT
NS IRRIG 1000ML POUR BTL (IV SOLUTION) ×10 IMPLANT
PACK COLON (CUSTOM PROCEDURE TRAY) ×4 IMPLANT
PACK MAJOR ABDOMINAL (CUSTOM PROCEDURE TRAY) ×4 IMPLANT
PAD ARMBOARD 7.5X6 YLW CONV (MISCELLANEOUS) ×5 IMPLANT
PENCIL HANDSWITCHING (ELECTRODE) ×4 IMPLANT
PENCIL SMOKE EVACUATOR (MISCELLANEOUS) ×4 IMPLANT
RELOAD GRN CONTOUR (ENDOMECHANICALS) ×4 IMPLANT
RELOAD PROXIMATE 75MM BLUE (ENDOMECHANICALS) ×12 IMPLANT
RELOAD STAPLE 40 GRN THCK (ENDOMECHANICALS) IMPLANT
RELOAD STAPLE 75 3.8 BLU REG (ENDOMECHANICALS) IMPLANT
RETRACTOR WND ALEXIS-O 25 LRG (MISCELLANEOUS) ×2 IMPLANT
RTRCTR WOUND ALEXIS O 25CM LRG (MISCELLANEOUS) ×4
SET BASIN LINEN APH (SET/KITS/TRAYS/PACK) ×4 IMPLANT
SPONGE INTESTINAL PEANUT (DISPOSABLE) ×3 IMPLANT
SPONGE SURGIFOAM ABS GEL 100 (HEMOSTASIS) ×3 IMPLANT
SPONGE T-LAP 18X18 ~~LOC~~+RFID (SPONGE) ×19 IMPLANT
STAPLER CVD CUT GN 40 RELOAD (ENDOMECHANICALS) ×4 IMPLANT
STAPLER CVD CUT GRN 40 RELOAD (ENDOMECHANICALS) IMPLANT
STAPLER GUN LINEAR PROX 60 (STAPLE) ×4 IMPLANT
STAPLER PROXIMATE 75MM BLUE (STAPLE) ×3 IMPLANT
STAPLER VISISTAT (STAPLE) ×4 IMPLANT
SUT CHROMIC 0 CT 1 (SUTURE) ×4 IMPLANT
SUT CHROMIC 0 SH (SUTURE) IMPLANT
SUT CHROMIC 2 0 SH (SUTURE) ×3 IMPLANT
SUT CHROMIC 3 0 SH 27 (SUTURE) ×12 IMPLANT
SUT MON AB 3-0 SH 27 (SUTURE) ×8 IMPLANT
SUT PROLENE 2 0 SH 30 (SUTURE) ×3 IMPLANT
SUT SILK 2 0 (SUTURE)
SUT SILK 2-0 18XBRD TIE 12 (SUTURE) IMPLANT
SUT SILK 3 0 SH CR/8 (SUTURE) ×7 IMPLANT
SUT VIC AB 0 CT1 27 (SUTURE) ×20
SUT VIC AB 0 CT1 27XBRD ANTBC (SUTURE) ×4 IMPLANT
SUT VIC AB 0 CT1 27XCR 8 STRN (SUTURE) ×9 IMPLANT
SUT VIC AB 0 CTX 36 (SUTURE) ×4
SUT VIC AB 0 CTX36XBRD ANTBCTR (SUTURE) ×3 IMPLANT
SUT VICRYL 3 0 (SUTURE) ×4 IMPLANT
SWAB COLLECTION DEVICE MRSA (MISCELLANEOUS) ×3 IMPLANT
SWAB CULTURE ESWAB REG 1ML (MISCELLANEOUS) ×3 IMPLANT
SYR 20ML LL LF (SYRINGE) ×11 IMPLANT
SYR TOOMEY IRRIG 70ML (MISCELLANEOUS) ×4
SYRINGE TOOMEY IRRIG 70ML (MISCELLANEOUS) ×2 IMPLANT
TOWEL ~~LOC~~+RFID 17X26 BLUE (SPONGE) ×4 IMPLANT
TRAY FOLEY MTR SLVR 16FR STAT (SET/KITS/TRAYS/PACK) ×5 IMPLANT
TRAY FOLEY W/BAG SLVR 16FR (SET/KITS/TRAYS/PACK) ×4
TRAY FOLEY W/BAG SLVR 16FR ST (SET/KITS/TRAYS/PACK) ×2 IMPLANT
WATER STERILE IRR 500ML POUR (IV SOLUTION) ×3 IMPLANT
YANKAUER SUCT BULB TIP 10FT TU (MISCELLANEOUS) ×7 IMPLANT

## 2021-05-17 NOTE — Anesthesia Preprocedure Evaluation (Signed)
Anesthesia Evaluation  Patient identified by MRN, date of birth, ID band Patient awake    Reviewed: Allergy & Precautions, NPO status , Patient's Chart, lab work & pertinent test results  Airway Mallampati: II  TM Distance: >3 FB Neck ROM: Full    Dental  (+) Dental Advisory Given Crowns :   Pulmonary Current SmokerPatient did not abstain from smoking.,    Pulmonary exam normal breath sounds clear to auscultation       Cardiovascular Normal cardiovascular exam+ dysrhythmias (tachycardia )  Rhythm:Regular Rate:Normal     Neuro/Psych PSYCHIATRIC DISORDERS Anxiety negative neurological ROS     GI/Hepatic negative GI ROS, Neg liver ROS,   Endo/Other  negative endocrine ROS  Renal/GU negative Renal ROS  negative genitourinary   Musculoskeletal negative musculoskeletal ROS (+)   Abdominal   Peds negative pediatric ROS (+)  Hematology negative hematology ROS (+)   Anesthesia Other Findings   Reproductive/Obstetrics negative OB ROS                            Anesthesia Physical Anesthesia Plan  ASA: 2  Anesthesia Plan: General   Post-op Pain Management:    Induction: Intravenous  PONV Risk Score and Plan: 3 and Ondansetron and Dexamethasone  Airway Management Planned: Oral ETT  Additional Equipment:   Intra-op Plan:   Post-operative Plan: Extubation in OR  Informed Consent: I have reviewed the patients History and Physical, chart, labs and discussed the procedure including the risks, benefits and alternatives for the proposed anesthesia with the patient or authorized representative who has indicated his/her understanding and acceptance.     Dental advisory given  Plan Discussed with: CRNA and Surgeon  Anesthesia Plan Comments:         Anesthesia Quick Evaluation

## 2021-05-17 NOTE — Progress Notes (Signed)
Patient arrived to room #337 via bed from PACU at 1600. Pt alert and oriented, denies c/o. Requesting gingerale to drink.  VSS.  Abd incision with honeycomb dressing intact, dressing with small areas of bloody drainage along with moderate amount of greenish drainage from ostomy site, which is leaking under wafer. Ostomy bag emptied of 125 ml of dark bloody/green colored drainage.  Both honeycomb dressing and colostomy wafer/bag removed, both areas cleaned. Pt has 19 sutures intact to midline abd incision, clean with no new noted bleeding. Colostomy stoma beefy red, small amount of bleeding noted. New honeycomb dressing applied along with new, smaller ostomy wafer applied as to not interfere with midline honeycomb dressing.  Foley cath intact with clear yellow urine. IV site in left hand removed per pt request due to painful with movement (position) and bleeding at site. IV site left AC infusing without difficulty or s/s infiltration.  Pt oriented to room and safety procedures. Call bell within reach. Pt's significant other at bedside. Pt and SO advised to call for any needs, state understanding.

## 2021-05-17 NOTE — Anesthesia Procedure Notes (Signed)
Procedure Name: Intubation Date/Time: 05/17/2021 9:55 AM Performed by: Brynda Peon, CRNA Pre-anesthesia Checklist: Patient identified, Emergency Drugs available, Suction available, Patient being monitored and Timeout performed Patient Re-evaluated:Patient Re-evaluated prior to induction Oxygen Delivery Method: Circle system utilized Preoxygenation: Pre-oxygenation with 100% oxygen Induction Type: IV induction Laryngoscope Size: 2 Grade View: Grade I Tube type: Oral Tube size: 7.0 mm Number of attempts: 1 Airway Equipment and Method: Stylet Placement Confirmation: ETT inserted through vocal cords under direct vision, positive ETCO2, CO2 detector and breath sounds checked- equal and bilateral Secured at: 20 cm Tube secured with: Tape Dental Injury: Teeth and Oropharynx as per pre-operative assessment

## 2021-05-17 NOTE — Op Note (Signed)
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  The patient now presents for a partial colectomy with possible colostomy, repair of colovesical fistula, and possible hysterectomy with bilateral salpingectomy/oophorectomy.  The risks and benefits of the procedures including bleeding, infection, cardiopulmonary difficulties, the possibility of a colostomy, anastomotic leak, and the possibility of a blood transfusion were fully explained to the patient, who gave informed consent.  Dr. Despina Hidden of gynecology and Dr. Ronne Binning of urology are both going to be involved in the procedure.  Procedure note: The patient was placed in the lithotomy position after induction of general endotracheal anesthesia.  The perineum and abdomen were prepped and draped using the usual sterile technique with ChloraPrep and Betadine.  Surgical site confirmation was performed.  Midline incision was made from just below the umbilicus to the suprapubic region.  The peritoneal cavity was entered into without difficulty.  The small bowel was inspected from the ligament of Treitz towards the terminal ileum.  Just proximal to the terminal ileum, a loop of small bowel was noted to be extended into the pelvis and had directly attached itself along the mesentery to the colorectal juncture.  The pelvis was noted to have a significant amount of thickened soft tissue consistent with previous inflammation.  The small bowel was ultimately freed from the right side of the pelvis.  A partial small bowel resection  was then performed.  A GIA stapler was placed proximally and distally across approximately 6 cm of small bowel.  The mesentery was divided using the Enseal.  A side to side small bowel anastomosis was performed using a 75 GIA.  The enterotomy was closed using a Contour TA.  The staple line was bolstered using 3-0 silk Lembert sutures.  The mesenteric defect which appeared to be more chronic in nature was closed using a running 2-0 Chromic Gut suture.  The patent anastomosis was found.  The sigmoid colon was mobilized along the peritoneal reflection.  Care was taken to avoid the left ureter.  Getting into the pelvis, there was thick scarred soft tissue between the colon and the left adnexa.  I was also able to bluntly free away the distal colon from the bladder.  The uterus was intermittently adherent to the underside of the bladder.  It was difficult to identify the left ovary due to the inflammatory changes.  A small pocket of purulent fluid was found.  Aerobic and anaerobic cultures were taken and sent to microbiology.  A contour stapler was placed just distal to the colorectal junction and fired.  A GIA 75 stapler was placed across the mid sigmoid colon and fired.  The mesentery was divided using the Enseal.  A suture was placed proximally for orientation purposes.  A 2-0 Prolene suture was placed on the rectal stump.  Dr. Despina Hidden then came in to examine the pelvis.  He decided against performing a TAH/BSO.  Please refer to his note.  Dr. Ronne Binning came in and repaired the colovesical fistula.  Please see his note.  The abdominal cavity was then copiously irrigated normal saline.  A colostomy was then formed in a cruciate-like  fashion along the left side of the abdomen at the level of the umbilicus.  Arista, Surgicel and Gelfoam were placed into the pelvis in order to control the raw areas of bleeding.  The fascia was reapproximated using a looped 0 PDS running suture.  Exparel was instilled into the surrounding  wound.  The skin was closed using staples.  The colostomy was attached to the abdominal wall fascia using 3-0 Chromic Gut interrupted sutures.  The colostomy was matured using 3-0 Chromic Gut interrupted sutures.  A colostomy bag was then applied.  All tape and needle counts were correct at the end of the procedures.  The patient was extubated in the operating room and transferred to PACU in stable condition.  Complications: None  EBL: 750 cc  Specimen: Sigmoid colon, suture proximal Partial small bowel

## 2021-05-17 NOTE — Transfer of Care (Signed)
Immediate Anesthesia Transfer of Care Note  Patient: Brandi Rowe  Procedure(s) Performed: PARTIAL COLECTOMY WITH COLOSTOMY (Abdomen) CLOSURE OF CYSTOTOMY (Abdomen) PELVIC DISSECTION (Abdomen)  Patient Location: PACU  Anesthesia Type:General  Level of Consciousness: drowsy, patient cooperative, confused and responds to stimulation  Airway & Oxygen Therapy: Patient Spontanous Breathing and Patient connected to face mask oxygen  Post-op Assessment: Report given to RN, Post -op Vital signs reviewed and stable and Patient moving all extremities X 4  Post vital signs: Reviewed and stable  Last Vitals:  Vitals Value Taken Time  BP 94/77 05/17/21 1322  Temp    Pulse 103 05/17/21 1327  Resp 11 05/17/21 1327  SpO2 100 % 05/17/21 1327  Vitals shown include unvalidated device data.  Last Pain:  Vitals:   05/17/21 0936  TempSrc: Oral  PainSc: 0-No pain      Patients Stated Pain Goal: 5 (05/17/21 0936)  Complications: No notable events documented.

## 2021-05-17 NOTE — Anesthesia Postprocedure Evaluation (Signed)
Anesthesia Post Note  Patient: MYRNA VONSEGGERN  Procedure(s) Performed: PARTIAL COLECTOMY WITH COLOSTOMY (Abdomen) CLOSURE OF CYSTOTOMY (Abdomen) PELVIC DISSECTION (Abdomen)  Patient location during evaluation: PACU Anesthesia Type: General Level of consciousness: awake and alert and oriented Pain management: pain level controlled Vital Signs Assessment: post-procedure vital signs reviewed and stable Respiratory status: spontaneous breathing, nonlabored ventilation and respiratory function stable Cardiovascular status: blood pressure returned to baseline and stable Postop Assessment: no apparent nausea or vomiting Anesthetic complications: no   No notable events documented.   Last Vitals:  Vitals:   05/17/21 1400 05/17/21 1415  BP: (!) 142/82 (!) 141/87  Pulse:  (!) 105  Resp:  19  Temp:    SpO2:  95%    Last Pain:  Vitals:   05/17/21 0936  TempSrc: Oral  PainSc: 0-No pain                 Jalyne Brodzinski C Vestal Crandall

## 2021-05-17 NOTE — Progress Notes (Signed)
Patient arrived to PACU bay 4 disoriented and restless trying to get out of be. Anesthesia called patient given pain medicine see emar. Also given antiemetic medication.    Ostomy leaking, dressng and appliance changed by OR RN, linens of bed changed and hospital placed on patient  1500--patient resting comfortably states she is in pain but does not want any more pain medicine

## 2021-05-17 NOTE — Interval H&P Note (Signed)
History and Physical Interval Note:  05/17/2021 8:49 AM  Brandi Rowe  has presented today for surgery, with the diagnosis of Colovesical fistula.  The various methods of treatment have been discussed with the patient and family. After consideration of risks, benefits and other options for treatment, the patient has consented to  Procedure(s): PARTIAL COLECTOMY W/POSSIBLE COLOSTOMY (N/A) HYSTERECTOMY SUPRACERVICAL ABDOMINAL (N/A) OPEN BILATERAL SALPINGO OOPHORECTOMY (Bilateral) as a surgical intervention.  The patient's history has been reviewed, patient examined, no change in status, stable for surgery.  I have reviewed the patient's chart and labs.  Questions were answered to the patient's satisfaction.     Franky Macho

## 2021-05-18 ENCOUNTER — Encounter (HOSPITAL_COMMUNITY): Payer: Self-pay | Admitting: General Surgery

## 2021-05-18 LAB — CBC
HCT: 32.2 % — ABNORMAL LOW (ref 36.0–46.0)
Hemoglobin: 10.7 g/dL — ABNORMAL LOW (ref 12.0–15.0)
MCH: 34.3 pg — ABNORMAL HIGH (ref 26.0–34.0)
MCHC: 33.2 g/dL (ref 30.0–36.0)
MCV: 103.2 fL — ABNORMAL HIGH (ref 80.0–100.0)
Platelets: 176 10*3/uL (ref 150–400)
RBC: 3.12 MIL/uL — ABNORMAL LOW (ref 3.87–5.11)
RDW: 13.7 % (ref 11.5–15.5)
WBC: 9 10*3/uL (ref 4.0–10.5)
nRBC: 0 % (ref 0.0–0.2)

## 2021-05-18 LAB — BASIC METABOLIC PANEL
Anion gap: 6 (ref 5–15)
BUN: 10 mg/dL (ref 8–23)
CO2: 25 mmol/L (ref 22–32)
Calcium: 8 mg/dL — ABNORMAL LOW (ref 8.9–10.3)
Chloride: 104 mmol/L (ref 98–111)
Creatinine, Ser: 0.66 mg/dL (ref 0.44–1.00)
GFR, Estimated: >60 mL/min (ref >60)
Glucose, Bld: 107 mg/dL — ABNORMAL HIGH (ref 70–99)
Potassium: 4 mmol/L (ref 3.5–5.1)
Sodium: 135 mmol/L (ref 135–145)

## 2021-05-18 LAB — MAGNESIUM: Magnesium: 1.6 mg/dL — ABNORMAL LOW (ref 1.7–2.4)

## 2021-05-18 LAB — PHOSPHORUS: Phosphorus: 2.1 mg/dL — ABNORMAL LOW (ref 2.5–4.6)

## 2021-05-18 LAB — SURGICAL PATHOLOGY

## 2021-05-18 MED ORDER — PANTOPRAZOLE SODIUM 40 MG PO TBEC
40.0000 mg | DELAYED_RELEASE_TABLET | Freq: Every day | ORAL | Status: DC
  Administered 2021-05-18 – 2021-05-19 (×2): 40 mg via ORAL
  Filled 2021-05-18 (×2): qty 1

## 2021-05-18 MED ORDER — POTASSIUM PHOSPHATES 15 MMOLE/5ML IV SOLN
15.0000 mmol | Freq: Once | INTRAVENOUS | Status: AC
  Administered 2021-05-18: 15 mmol via INTRAVENOUS
  Filled 2021-05-18: qty 5

## 2021-05-18 MED ORDER — MAGNESIUM SULFATE 2 GM/50ML IV SOLN
2.0000 g | Freq: Once | INTRAVENOUS | Status: AC
  Administered 2021-05-18: 2 g via INTRAVENOUS
  Filled 2021-05-18: qty 50

## 2021-05-18 NOTE — Op Note (Incomplete)
*** ° °  Lazaro Arms, MD 05/18/2021 1:18 PM

## 2021-05-18 NOTE — Progress Notes (Signed)
MEDICATION RELATED CONSULT NOTE - INITIAL   Pharmacy Consult for hypophosphatemia  Allergies  Allergen Reactions   Grass Extracts [Gramineae Pollens]     Dust, mold, mites    Patient Measurements: Height: 5\' 4"  (162.6 cm) Weight: 51.3 kg (113 lb) IBW/kg (Calculated) : 54.7 Adjusted Body Weight:   Vital Signs: Temp: 97.6 F (36.4 C) (10/27 0508) Temp Source: Oral (10/27 0508) BP: 127/98 (10/27 0508) Pulse Rate: 94 (10/27 0508) Intake/Output from previous day: 10/26 0701 - 10/27 0700 In: 3588.2 [P.O.:120; I.V.:3368.2; IV Piggyback:100] Out: 2255 [Urine:1130; Stool:375; Blood:750] Intake/Output from this shift: No intake/output data recorded.  Labs: Recent Labs    05/15/21 1459 05/17/21 1416 05/18/21 0613  WBC 8.5  --  9.0  HGB 16.0* 13.8 10.7*  HCT 46.7* 40.5 32.2*  PLT 239  --  176  CREATININE 0.77  --  0.66  MG  --   --  1.6*  PHOS  --   --  2.1*   Estimated Creatinine Clearance: 56 mL/min (by C-G formula based on SCr of 0.66 mg/dL).   Microbiology: Recent Results (from the past 720 hour(s))  SARS CORONAVIRUS 2 (TAT 6-24 HRS) Nasopharyngeal Nasopharyngeal Swab     Status: None   Collection Time: 05/15/21  2:35 PM   Specimen: Nasopharyngeal Swab  Result Value Ref Range Status   SARS Coronavirus 2 NEGATIVE NEGATIVE Final    Comment: (NOTE) SARS-CoV-2 target nucleic acids are NOT DETECTED.  The SARS-CoV-2 RNA is generally detectable in upper and lower respiratory specimens during the acute phase of infection. Negative results do not preclude SARS-CoV-2 infection, do not rule out co-infections with other pathogens, and should not be used as the sole basis for treatment or other patient management decisions. Negative results must be combined with clinical observations, patient history, and epidemiological information. The expected result is Negative.  Fact Sheet for Patients: 05/17/21  Fact Sheet for Healthcare  Providers: HairSlick.no  This test is not yet approved or cleared by the quierodirigir.com FDA and  has been authorized for detection and/or diagnosis of SARS-CoV-2 by FDA under an Emergency Use Authorization (EUA). This EUA will remain  in effect (meaning this test can be used) for the duration of the COVID-19 declaration under Se ction 564(b)(1) of the Act, 21 U.S.C. section 360bbb-3(b)(1), unless the authorization is terminated or revoked sooner.  Performed at [facility], [address] [place], [place]   Aerobic/Anaerobic Culture w Gram Stain (surgical/deep wound)     Status: None (Preliminary result)   Collection Time: 05/17/21 10:45 AM   Specimen: PATH Other; Tissue  Result Value Ref Range Status   Specimen Description   Final    OTHER Performed at [facility], [address], [place], [place]    Special Requests   Final    NONE Performed at [facility], [address] [place], [place]    Gram Stain   Final    FEW WBC PRESENT, PREDOMINANTLY MONONUCLEAR NO ORGANISMS SEEN Performed at [facility], [address], [place], [place]    Culture PENDING  Incomplete   Report Status PENDING  Incomplete    Medical History: Past Medical History:  Diagnosis Date   Anxiety    Gout      Plan:  Potassium phophsate 15 mmol IV x 1 F/U phos level in AM  [id number], PharmD Clinical Pharmacist 05/18/2021 8:14 AM

## 2021-05-18 NOTE — Consult Note (Signed)
WOC Nurse ostomy consult note Stoma type/location: LLQ end colostomy Stomal assessment/size: 1 1/4" edematous, pink moist os at center Peristomal assessment: intact  bruising noted from 9 to 3 o'clock.   Midline surgical incision with honeycomb dressing in place.  Treatment options for stomal/peristomal skin: barrier ring and 2 piece 2 1/4" pouch Output scant brown effluent Ostomy pouching: 2pc. Pouch with barrier ring  Education provided: Patient is tearful upon my arrival.  She was hoping to not require an ostomy but understands it was necessary. She is hopeful for reversal.  Emotional support provided.  She is retired, lives with her long term partner who still works.  She anticipates she will be performing her own care. WE discuss twice weekly pouch changes, showering, filtered pouches as well as opaque pouches as these are concerns she has.  We remove the old pouch, trimming around the minimal overlap with the honeycomb dressing. Cleanse the stoma and peristomal skin.  She is able to stretch and apply the barrier ring and understands this will protect her skin and promote seal. Her abdomen is round and edematous today. The stoma is 1 1/4" round and we cut the barrier to fit.  She is able to assemble the pouch, remove backing and apply the pouch system with minimal assistance.  WE discuss emptying when 1/3 full and demonstrate how to do this.  She states she will stand in front of her bathroom mirror at home.  She is able to roll closed and seems more confident at the conclusion of the session. I discuss the [facility] outpatient ostomy clinic as an outpatient resource.  IF MD agrees this would be beneficial, please fax referral, or enter electronically in Epic the referral. (Fax- [phone]) Enrolled patient in [facility] DC program: Yes will enroll today.  Requesting filtered, opaque 2 1/4" pouches with barrier rings.  I inform her we will perform one additional pouch change Saturday around  0830 with my partner and she is in agreement with this.  No further questions at this time. One additional pouch set left in room. Will order 5 pouch sets for discharge.  Will follow.  Maple Hudson MSN, RN, FNP-BC CWON Wound, Ostomy, Continence Nurse Pager [phone]

## 2021-05-18 NOTE — Progress Notes (Signed)
1 Day Post-Op  Subjective: Patient has minimal incisional pain.  Minimal output from the colostomy is noted.  It is dark red.  Objective: Vital signs in last 24 hours: Temp:  [97.6 F (36.4 C)-98.5 F (36.9 C)] 97.6 F (36.4 C) (10/27 0508) Pulse Rate:  [88-110] 94 (10/27 0508) Resp:  [8-22] 18 (10/27 0508) BP: (121-171)/(68-99) 127/98 (10/27 0508) SpO2:  [92 %-100 %] 100 % (10/27 0508) Weight:  [51.3 kg] 51.3 kg (10/26 1621) Last BM Date: 05/17/21  Intake/Output from previous day: 10/26 0701 - 10/27 0700 In: 3588.2 [P.O.:120; I.V.:3368.2; IV Piggyback:100] Out: 2255 [Urine:1130; Stool:375; Blood:750] Intake/Output this shift: No intake/output data recorded.  General appearance: alert, cooperative, and no distress Resp: clear to auscultation bilaterally Cardio: regular rate and rhythm, S1, S2 normal, no murmur, click, rub or gallop GI: Soft, incision healing well.  Colostomy beefy red but patent.  Lab Results:  Recent Labs    05/15/21 1459 05/17/21 1416 05/18/21 0613  WBC 8.5  --  9.0  HGB 16.0* 13.8 10.7*  HCT 46.7* 40.5 32.2*  PLT 239  --  176   BMET Recent Labs    05/15/21 1459 05/18/21 0613  NA 136 135  K 4.4 4.0  CL 101 104  CO2 27 25  GLUCOSE 99 107*  BUN 21 10  CREATININE 0.77 0.66  CALCIUM 9.9 8.0*   PT/INR No results for input(s): LABPROT, INR in the last 72 hours.  Studies/Results: No results found.  Anti-infectives: Anti-infectives (From admission, onward)    Start     Dose/Rate Route Frequency Ordered Stop   05/17/21 0900  ertapenem (INVANZ) 1,000 mg in sodium chloride 0.9 % 100 mL IVPB        1 g 200 mL/hr over 30 Minutes Intravenous On call to O.R. 05/17/21 0856 05/17/21 1032       Assessment/Plan: s/p Procedure(s): PARTIAL COLECTOMY WITH COLOSTOMY PELVIC DISSECTION Closure of Cystotomy Impression: Stable on postoperative day 1.  I had a long discussion with the patient concerning the findings and the need for colostomy.  She  understands. Plan: We will treat hypomagnesemia and hypophosphatemia.  Patient's hemoglobin has not unexpectedly decreased secondary to acute surgical blood loss and rehydration.  Enterostomal therapy nurse to see the patient.  LOS: 1 day    Franky Macho 05/18/2021

## 2021-05-19 LAB — CBC
HCT: 29.3 % — ABNORMAL LOW (ref 36.0–46.0)
Hemoglobin: 9.5 g/dL — ABNORMAL LOW (ref 12.0–15.0)
MCH: 33.1 pg (ref 26.0–34.0)
MCHC: 32.4 g/dL (ref 30.0–36.0)
MCV: 102.1 fL — ABNORMAL HIGH (ref 80.0–100.0)
Platelets: 147 10*3/uL — ABNORMAL LOW (ref 150–400)
RBC: 2.87 MIL/uL — ABNORMAL LOW (ref 3.87–5.11)
RDW: 13.6 % (ref 11.5–15.5)
WBC: 6.7 10*3/uL (ref 4.0–10.5)
nRBC: 0 % (ref 0.0–0.2)

## 2021-05-19 LAB — BASIC METABOLIC PANEL
Anion gap: 6 (ref 5–15)
BUN: 8 mg/dL (ref 8–23)
CO2: 23 mmol/L (ref 22–32)
Calcium: 7.9 mg/dL — ABNORMAL LOW (ref 8.9–10.3)
Chloride: 107 mmol/L (ref 98–111)
Creatinine, Ser: 0.6 mg/dL (ref 0.44–1.00)
GFR, Estimated: >60 mL/min (ref >60)
Glucose, Bld: 92 mg/dL (ref 70–99)
Potassium: 3.8 mmol/L (ref 3.5–5.1)
Sodium: 136 mmol/L (ref 135–145)

## 2021-05-19 LAB — MAGNESIUM: Magnesium: 2.2 mg/dL (ref 1.7–2.4)

## 2021-05-19 LAB — PHOSPHORUS: Phosphorus: 2.1 mg/dL — ABNORMAL LOW (ref 2.5–4.6)

## 2021-05-19 MED ORDER — POTASSIUM PHOSPHATES 15 MMOLE/5ML IV SOLN
15.0000 mmol | Freq: Once | INTRAVENOUS | Status: AC
  Administered 2021-05-19: 15 mmol via INTRAVENOUS
  Filled 2021-05-19: qty 5

## 2021-05-19 NOTE — Progress Notes (Signed)
MEDICATION RELATED CONSULT NOTE -  Pharmacy Consult for hypophosphatemia  Allergies  Allergen Reactions   Grass Extracts [Gramineae Pollens]     Dust, mold, mites    Patient Measurements: Height: 5\' 4"  (162.6 cm) Weight: 51.3 kg (113 lb) IBW/kg (Calculated) : 54.7 Adjusted Body Weight:   Vital Signs: Temp: 98 F (36.7 C) (10/28 0532) BP: 153/95 (10/28 0532) Pulse Rate: 97 (10/28 0532) Intake/Output from previous day: 10/27 0701 - 10/28 0700 In: 1902.7 [P.O.:1080; I.V.:623.2; IV Piggyback:199.5] Out: 2450 [Urine:2100; Stool:350] Intake/Output from this shift: No intake/output data recorded.  Labs: Recent Labs    05/17/21 1416 05/18/21 0613 05/19/21 0535  WBC  --  9.0 6.7  HGB 13.8 10.7* 9.5*  HCT 40.5 32.2* 29.3*  PLT  --  176 147*  CREATININE  --  0.66  --   MG  --  1.6* 2.2  PHOS  --  2.1* 2.1*    Estimated Creatinine Clearance: 56 mL/min (by C-G formula based on SCr of 0.66 mg/dL).   Microbiology: Recent Results (from the past 720 hour(s))  SARS CORONAVIRUS 2 (TAT 6-24 HRS) Nasopharyngeal Nasopharyngeal Swab     Status: None   Collection Time: 05/15/21  2:35 PM   Specimen: Nasopharyngeal Swab  Result Value Ref Range Status   SARS Coronavirus 2 NEGATIVE NEGATIVE Final    Comment: (NOTE) SARS-CoV-2 target nucleic acids are NOT DETECTED.  The SARS-CoV-2 RNA is generally detectable in upper and lower respiratory specimens during the acute phase of infection. Negative results do not preclude SARS-CoV-2 infection, do not rule out co-infections with other pathogens, and should not be used as the sole basis for treatment or other patient management decisions. Negative results must be combined with clinical observations, patient history, and epidemiological information. The expected result is Negative.  Fact Sheet for Patients: 05/17/21  Fact Sheet for Healthcare Providers: HairSlick.no  This  test is not yet approved or cleared by the [employer]irigir.com FDA and  has been authorized for detection and/or diagnosis of SARS-CoV-2 by FDA under an Emergency Use Authorization (EUA). This EUA will remain  in effect (meaning this test can be used) for the duration of the COVID-19 declaration under Se ction 564(b)(1) of the Act, 21 U.S.C. section 360bbb-3(b)(1), unless the authorization is terminated or revoked sooner.  Performed at [facility], [address] [place], [place]   Aerobic/Anaerobic Culture w Gram Stain (surgical/deep wound)     Status: None (Preliminary result)   Collection Time: 05/17/21 10:45 AM   Specimen: PATH Other; Tissue  Result Value Ref Range Status   Specimen Description   Final    OTHER Performed at [facility], [address], [place], [place]    Special Requests   Final    NONE Performed at [facility], [address], [place], [place]    Gram Stain   Final    FEW WBC PRESENT, PREDOMINANTLY MONONUCLEAR NO ORGANISMS SEEN    Culture   Final    NO GROWTH < 24 HOURS Performed at [facility], [address], [place], [place]    Report Status PENDING  Incomplete    Medical History: Past Medical History:  Diagnosis Date   Anxiety    Gout      Plan:  Repeat Potassium phophsate 15 mmol IV x 1 F/U phos level in AM  [id number], PharmD Clinical Pharmacist 05/19/2021 7:56 AM

## 2021-05-19 NOTE — Progress Notes (Signed)
2 Days Post-Op  Subjective: Patient feels much better today.  She denies any nausea or vomiting.  She is on a regular diet.  Objective: Vital signs in last 24 hours: Temp:  [98 F (36.7 C)-98.4 F (36.9 C)] 98.1 F (36.7 C) (10/28 1500) Pulse Rate:  [82-97] 82 (10/28 1500) Resp:  [18-19] 19 (10/28 1100) BP: (117-153)/(71-95) 138/82 (10/28 1500) SpO2:  [96 %-100 %] 98 % (10/28 1500) Last BM Date: 05/18/21  Intake/Output from previous day: 10/27 0701 - 10/28 0700 In: 1902.7 [P.O.:1080; I.V.:623.2; IV Piggyback:199.5] Out: 2450 [Urine:2100; Stool:350] Intake/Output this shift: Total I/O In: 360 [P.O.:360] Out: 225 [Urine:225]  General appearance: alert, cooperative, and no distress Resp: clear to auscultation bilaterally Cardio: regular rate and rhythm, S1, S2 normal, no murmur, click, rub or gallop GI: Soft, incision healing well.  Colostomy pink and patent.  Lab Results:  Recent Labs    05/18/21 0613 05/19/21 0535  WBC 9.0 6.7  HGB 10.7* 9.5*  HCT 32.2* 29.3*  PLT 176 147*   BMET Recent Labs    05/18/21 0613 05/19/21 0535  NA 135 136  K 4.0 3.8  CL 104 107  CO2 25 23  GLUCOSE 107* 92  BUN 10 8  CREATININE 0.66 0.60  CALCIUM 8.0* 7.9*   PT/INR No results for input(s): LABPROT, INR in the last 72 hours.  Studies/Results: No results found.  Anti-infectives: Anti-infectives (From admission, onward)    Start     Dose/Rate Route Frequency Ordered Stop   05/17/21 0900  ertapenem (INVANZ) 1,000 mg in sodium chloride 0.9 % 100 mL IVPB        1 g 200 mL/hr over 30 Minutes Intravenous On call to O.R. 05/17/21 0856 05/17/21 1032       Assessment/Plan: s/p Procedure(s): PARTIAL COLECTOMY WITH COLOSTOMY PELVIC DISSECTION Closure of Cystotomy Impression: Stable on postoperative day 2.  She has tolerated the surgery well.  Her urine output is clear.  She is to receive more instruction about colostomy care tomorrow.  Her hypomagnesemia has resolved.  Pharmacy  is still treating her hypophosphatemia.  We will decrease her IV fluids.  Anticipate discharge in next 24 to 48 hours.  LOS: 2 days    Franky Macho 05/19/2021

## 2021-05-20 LAB — CBC
HCT: 26.2 % — ABNORMAL LOW (ref 36.0–46.0)
Hemoglobin: 8.7 g/dL — ABNORMAL LOW (ref 12.0–15.0)
MCH: 33.9 pg (ref 26.0–34.0)
MCHC: 33.2 g/dL (ref 30.0–36.0)
MCV: 101.9 fL — ABNORMAL HIGH (ref 80.0–100.0)
Platelets: 136 10*3/uL — ABNORMAL LOW (ref 150–400)
RBC: 2.57 MIL/uL — ABNORMAL LOW (ref 3.87–5.11)
RDW: 13.4 % (ref 11.5–15.5)
WBC: 4.9 10*3/uL (ref 4.0–10.5)
nRBC: 0 % (ref 0.0–0.2)

## 2021-05-20 LAB — BASIC METABOLIC PANEL
Anion gap: 4 — ABNORMAL LOW (ref 5–15)
BUN: 7 mg/dL — ABNORMAL LOW (ref 8–23)
CO2: 26 mmol/L (ref 22–32)
Calcium: 8.1 mg/dL — ABNORMAL LOW (ref 8.9–10.3)
Chloride: 107 mmol/L (ref 98–111)
Creatinine, Ser: 0.65 mg/dL (ref 0.44–1.00)
GFR, Estimated: >60 mL/min (ref >60)
Glucose, Bld: 95 mg/dL (ref 70–99)
Potassium: 3.9 mmol/L (ref 3.5–5.1)
Sodium: 137 mmol/L (ref 135–145)

## 2021-05-20 LAB — PHOSPHORUS: Phosphorus: 2.9 mg/dL (ref 2.5–4.6)

## 2021-05-20 MED ORDER — FERROUS SULFATE 325 (65 FE) MG PO TBEC: 325.0000 mg | DELAYED_RELEASE_TABLET | Freq: Two times a day (BID) | ORAL | 3 refills | Status: DC

## 2021-05-20 MED ORDER — HYDROCODONE-ACETAMINOPHEN 5-325 MG PO TABS: 1.0000 | ORAL_TABLET | ORAL | 0 refills | Status: DC | PRN

## 2021-05-20 NOTE — Consult Note (Signed)
WOC Nurse ostomy follow up Stoma type/location: LLQ colostomy Stomal assessment/size: 1 and 1/4 inch Peristomal assessment: intact, clear Treatment options for stomal/peristomal skin: skin barrier ring Output: brown stool Ostomy pouching: 2pc. 2 and 1/4 inch pouching system with skin barrier ring.   Education provided:  Extended visit this morning to perform pouch emptying in bathroom and pouch change while patient sitting up in chair.  Explained stoma characteristics (budded, flush, color, texture, care) Demonstrated pouch change (cutting new skin barrier, measuring stoma, cleaning peristomal skin and stoma, use of barrier ring): Patient performed with minimal assist Education on emptying when 1/3 to 1/2 full and how to empty, patient performed with minimal assist. We discussed that she did not need to wear gloves. Demonstrated use of wick to clean spout  Discussed bathing, diet, gas, medication use, constipation Discussed risk of peristomal hernia  Answered patient questions about exercise (walking is best and short, frequent walks are preferred over long walks). Patient instructed to limit lifting more than 15 pounds until instructed otherwise by her physician. Patient wept during pouch change and after as we discussed the emotional roller coaster of her last 6 months. She is confident she will manage this and is looking forward to reanastomosis surgery in 3-6 months.  Honeycomb dressing removed today and wound edges are in close approximation. Dr. Lovell Sheehan in prior to the conclusion of my visit. Discharge today.  Supplies provided for discharge: 6 pouches, 6 skin barriers and 6 rings.    Enrolled patient in [place] [employer] Start Discharge program: Yes, previously.   Thanks, Ladona Mow, MSN, RN, GNP, [place]  Pager# [phone]

## 2021-05-20 NOTE — Discharge Summary (Signed)
Physician Discharge Summary  Patient ID: Brandi Rowe MRN: [id number] DOB/AGE: 1956-07-19 65 y.o.  Admit date: 05/17/2021 Discharge date: 05/20/2021  Admission Diagnoses: Colovesical fistula, colovaginal fistula, diverticulitis  Discharge Diagnoses: Same Active Problems:   Diverticulitis of colon   Status post Hartmann procedure [facility])   Colovesical fistula   Discharged Condition: good  Hospital Course: Patient is a 65 year old white female diagnosed earlier this year with a colovesical fistula as well as a colovaginal fistula who presented for surgery on 05/17/2021.  She underwent a partial colectomy with colostomy as well as repair of her colovesical fistula.  This was performed by Dr. Ronne Binning of urology.  Dr. Despina Hidden of gynecology was involved with the surgery.  Intraoperatively, he felt that a hysterectomy with oophorectomy was not indicated given the findings.  Patient's postoperative course has been for the most part unremarkable.  She does have an anemia secondary to acute surgical blood loss.  Her bowel function returned.  Her urine in the Foley bag has been clear.  She has been seen by enterostomal therapy.  She is being discharged on 05/20/2021 in good and improving condition with a Foley in place.  Treatments: surgery: Hartman's procedure, repair of colovesical fistula on 05/17/2021  Discharge Exam: Blood pressure (!) 165/130, pulse 75, temperature 98.1 F (36.7 C), temperature source Oral, resp. rate 19, height 5\' 4"  (1.626 m), weight 51.3 kg, SpO2 99 %. General appearance: alert, cooperative, and no distress Resp: clear to auscultation bilaterally Cardio: regular rate and rhythm, S1, S2 normal, no murmur, click, rub or gallop GI: Soft, incision healing well with a hematoma present superiorly.  Ecchymosis is resolving.  Colostomy pink and patent.  Disposition: Discharge disposition: 01-Home or Self Care       Discharge Instructions     Diet - low sodium heart  healthy   Complete by: As directed    Increase activity slowly   Complete by: As directed       Allergies as of 05/20/2021       Reactions   Grass Extracts [gramineae Pollens]    Dust, mold, mites        Medication List     STOP taking these medications    metroNIDAZOLE 500 MG tablet Commonly known as: Flagyl   neomycin 500 MG tablet Commonly known as: MYCIFRADIN   phenazopyridine 100 MG tablet Commonly known as: Pyridium       TAKE these medications    acetaminophen 500 MG tablet Commonly known as: TYLENOL Take 500 mg by mouth 2 (two) times daily as needed for moderate pain or headache.   aspirin EC 81 MG tablet Take 81 mg by mouth daily.   calcium carbonate 500 MG chewable tablet Commonly known as: TUMS - dosed in mg elemental calcium Chew 1 tablet by mouth daily as needed for indigestion or heartburn.   diphenhydrAMINE 25 MG tablet Commonly known as: BENADRYL Take 25 mg by mouth at bedtime as needed for allergies.   ferrous sulfate 325 (65 FE) MG EC tablet Take 1 tablet (325 mg total) by mouth 2 (two) times daily.   folic acid 400 MCG tablet Commonly known as: FOLVITE Take 400 mcg by mouth daily. What changed: Another medication with the same name was removed. Continue taking this medication, and follow the directions you see here.   HYDROcodone-acetaminophen 5-325 MG tablet Commonly known as: Norco Take 1 tablet by mouth every 4 (four) hours as needed for moderate pain.   REFRESH OP Place 1 drop into  both eyes daily as needed (dry eyes).   vitamin B-12 1000 MCG tablet Commonly known as: CYANOCOBALAMIN Take 1 tablet (1,000 mcg total) by mouth daily.        Follow-up Information     Franky Macho, MD. Schedule an appointment as soon as possible for a visit on 05/30/2021.   Specialty: General Surgery Contact information: [address] [place] [address] [phone]                 Signed: Franky Macho 05/20/2021,  10:06 AM

## 2021-05-20 NOTE — Progress Notes (Signed)
Discharge instructions provided to patient. Patient verbalized understanding of discharge instructions. WOC Nurse has already been by to assist in ostomy care before discharge. Awaiting additional supplies to arrive prior to discharging today. No additional questions or concerns.

## 2021-05-22 ENCOUNTER — Other Ambulatory Visit (INDEPENDENT_AMBULATORY_CARE_PROVIDER_SITE_OTHER): Payer: Medicare Other | Admitting: General Surgery

## 2021-05-22 ENCOUNTER — Telehealth: Payer: Self-pay

## 2021-05-22 DIAGNOSIS — Z09 Encounter for follow-up examination after completed treatment for conditions other than malignant neoplasm: Secondary | ICD-10-CM

## 2021-05-22 LAB — AEROBIC/ANAEROBIC CULTURE W GRAM STAIN (SURGICAL/DEEP WOUND): Culture: NO GROWTH

## 2021-05-22 MED ORDER — TRAMADOL HCL 50 MG PO TABS: 50.0000 mg | ORAL_TABLET | Freq: Four times a day (QID) | ORAL | 0 refills | Status: DC | PRN

## 2021-05-22 NOTE — Progress Notes (Signed)
Patient not tolerating hydrocodone well.  We will switch to tramadol.

## 2021-05-22 NOTE — Telephone Encounter (Signed)
Transition Care Management Unsuccessful Follow-up Telephone Call  Date of discharge and from where:  05/20/2021 / [facility]   Attempts:  1st Attempt  Reason for unsuccessful TCM follow-up call:  HIPAA compliant. Left voice message   George Ina RN,BSN,CCM RN Case Manager [facility] [phone]

## 2021-05-24 ENCOUNTER — Telehealth: Payer: Self-pay

## 2021-05-24 NOTE — Telephone Encounter (Signed)
Transition Care Management Unsuccessful Follow-up Telephone Call  Date of discharge and from where:  05/20/2021  [facility]  Attempts:  2nd Attempt  Reason for unsuccessful TCM follow-up call:  No answer/busy  Rowe Pavy, RN, BSN, CEN [facility] Coordinator [phone]

## 2021-05-25 ENCOUNTER — Encounter: Payer: Medicare Other | Admitting: Obstetrics & Gynecology

## 2021-05-30 ENCOUNTER — Ambulatory Visit (INDEPENDENT_AMBULATORY_CARE_PROVIDER_SITE_OTHER): Payer: Medicare Other | Admitting: General Surgery

## 2021-05-30 ENCOUNTER — Other Ambulatory Visit: Payer: Self-pay

## 2021-05-30 ENCOUNTER — Encounter: Payer: Self-pay | Admitting: General Surgery

## 2021-05-30 VITALS — BP 151/96 | HR 54 | Temp 97.6°F | Resp 16 | Ht 64.0 in | Wt 111.0 lb

## 2021-05-30 DIAGNOSIS — Z09 Encounter for follow-up examination after completed treatment for conditions other than malignant neoplasm: Secondary | ICD-10-CM

## 2021-05-30 NOTE — Progress Notes (Signed)
Subjective:     Brandi Rowe  Here for follow-up status post Hartman's procedure for colovesical fistula secondary to diverticulitis.  Patient is doing well.  She is tolerating the colostomy well.  She is seeing Dr. Ronne Binning of urology in 2 days to have her Foley removed. Objective:    BP (!) 151/96   Pulse (!) 54   Temp 97.6 F (36.4 C) (Other (Comment))   Resp 16   Ht 5\' 4"  (1.626 m)   Wt 111 lb (50.3 kg)   SpO2 96%   BMI 19.05 kg/m   General:  alert, cooperative, and no distress  Abdomen soft, incision healing well.  Staples removed, Steri-Strips applied.  Colostomy pink and patent.     Assessment:    Doing well postoperatively.    Plan:   I told the patient that I may be able to reverse her colostomy in 3 months.  She will call my office to schedule that appointment.

## 2021-06-01 ENCOUNTER — Ambulatory Visit: Payer: Medicare Other | Admitting: Urology

## 2021-06-01 NOTE — Op Note (Signed)
Preoperative diagnosis: Colovesical fistula  Postop diagnosis: Same  Procedure: 1.  Closure of cystotomy  Attending: Wilkie Aye   Anesthesia: General  Estimated blood loss: 20 cc  Drains: 20 French Foley catheter  Specimens: none  Antibiotics: ancef  Findings: small 1cm cystotomy left posterior dome of the bladder.  Indications: Patient is a 65 year old female with a history of colovesical fistula who is currently undergoing bowel diversion with Dr. Lovell Sheehan. Urology was consulted for closure of cystotomy.   Procedure in detail: Prior to procedure consent was obtained. Patient was brought to the operating room and briefing was done sure correct patient, correct procedure, correct site.  General anesthesia was in administered patient was placed in the dorsal lithotomy position.  Her abdomin and genitalia was then prepped and draped int he usual sterile fashion.  At the time Urology was consults the colon adhesions to the bladder had been removed and we identified a 1cm cystotomy area at the posterior left dome. Using 2 layers of 0 vicryl the cystotomy was closed and it was leak tested with 250cc in the bladder. No leak was noted. The then concluded the Urologic component of the surgery  which was well tolerated by the patient.  Complications: None  Condition: Stable, x-rayed, transferred to PACU.  Plan: Patient is to be admitted for inpatient stay. The foley catheter will be removed in 14 days.   Marland Kitchen

## 2021-06-02 ENCOUNTER — Ambulatory Visit (INDEPENDENT_AMBULATORY_CARE_PROVIDER_SITE_OTHER): Payer: Medicare Other | Admitting: Urology

## 2021-06-02 ENCOUNTER — Encounter: Payer: Self-pay | Admitting: Urology

## 2021-06-02 ENCOUNTER — Other Ambulatory Visit: Payer: Self-pay

## 2021-06-02 VITALS — BP 138/82 | HR 91 | Temp 97.8°F | Wt 111.4 lb

## 2021-06-02 DIAGNOSIS — N321 Vesicointestinal fistula: Secondary | ICD-10-CM

## 2021-06-02 MED ORDER — MIRABEGRON ER 25 MG PO TB24: 25.0000 mg | ORAL_TABLET | Freq: Every day | ORAL | 0 refills | Status: DC

## 2021-06-02 NOTE — Progress Notes (Signed)
06/02/2021 11:30 AM   Brandi Rowe 12/16/1955 [id number]  Referring provider: Eulogio Bear, NP [phone] [facility] [address]  [place] [address]  Followup colovesical fistular   HPI: Brandi Rowe is a [id number] here for followup after colovesical fistular repair. Urine is clear. She denies any worsening pelvic pain. Voiding trial passed today   PMH: Past Medical History:  Diagnosis Date   Anxiety    Gout     Surgical History: Past Surgical History:  Procedure Laterality Date   BLADDER REPAIR N/A 05/17/2021   Procedure: Closure of Cystotomy;  Surgeon: Cleon Gustin, MD;  Location: [facility];  Service: Urology;  Laterality: N/A;   CARPAL TUNNEL RELEASE Right 2002   MANDIBLE SURGERY  1985   PARTIAL COLECTOMY N/A 05/17/2021   Procedure: PARTIAL COLECTOMY WITH COLOSTOMY;  Surgeon: Aviva Signs, MD;  Location: [facility];  Service: General;  Laterality: N/A;   PELVIC LYMPH NODE DISSECTION N/A 05/17/2021   Procedure: PELVIC DISSECTION;  Surgeon: Florian Buff, MD;  Location: [facility];  Service: Gynecology;  Laterality: N/A;    Home Medications:  Allergies as of 06/02/2021       Reactions   Grass Extracts [gramineae Pollens]    Dust, mold, mites        Medication List        Accurate as of June 02, 2021 11:30 AM. If you have any questions, ask your nurse or doctor.          acetaminophen 500 MG tablet Commonly known as: TYLENOL Take 500 mg by mouth 2 (two) times daily as needed for moderate pain or headache.   aspirin EC 81 MG tablet Take 81 mg by mouth daily.   calcium carbonate 500 MG chewable tablet Commonly known as: TUMS - dosed in mg elemental calcium Chew 1 tablet by mouth daily as needed for indigestion or heartburn.   diphenhydrAMINE 25 MG tablet Commonly known as: BENADRYL Take 25 mg by mouth at bedtime as needed for allergies.   ferrous sulfate 325 (65 FE) MG EC tablet Take 1 tablet (325 mg total) by mouth 2 (two) times daily.   folic  acid [id number] MCG tablet Commonly known as: FOLVITE Take 400 mcg by mouth daily.   REFRESH OP Place 1 drop into both eyes daily as needed (dry eyes).   traMADol 50 MG tablet Commonly known as: Ultram Take 1 tablet (50 mg total) by mouth every 6 (six) hours as needed.   vitamin B-12 1000 MCG tablet Commonly known as: CYANOCOBALAMIN Take 1 tablet (1,000 mcg total) by mouth daily.        Allergies:  Allergies  Allergen Reactions   Grass Extracts [Gramineae Pollens]     Dust, mold, mites    Family History: Family History  Problem Relation Age of Onset   Congestive Heart Failure Mother    Uterine cancer Mother    Pancreatic cancer Father    Atrial fibrillation Brother    Stroke Maternal Grandmother    Heart disease Maternal Grandmother    Glaucoma Maternal Grandfather     Social History:  reports that she has been smoking cigarettes. She has a 23.00 pack-year smoking history. She has never used smokeless tobacco. She reports current alcohol use of about 7.0 standard drinks per week. She reports that she does not use drugs.  ROS: All other review of systems were reviewed and are negative except what is noted above in HPI  Physical Exam: BP 138/82  Pulse 91   Temp 97.8 F (36.6 C)   Wt 111 lb 6.4 oz (50.5 kg)   BMI 19.12 kg/m   Constitutional:  Alert and oriented, No acute distress. HEENT: [place] AT, moist mucus membranes.  Trachea midline, no masses. Cardiovascular: No clubbing, cyanosis, or edema. Respiratory: Normal respiratory effort, no increased work of breathing. GI: Abdomen is soft, nontender, nondistended, no abdominal masses GU: No CVA tenderness.  Lymph: No cervical or inguinal lymphadenopathy. Skin: No rashes, bruises or suspicious lesions. Neurologic: Grossly intact, no focal deficits, moving all 4 extremities. Psychiatric: Normal mood and affect.  Laboratory Data: Lab Results  Component Value Date   WBC 4.9 05/20/2021   HGB 8.7 (L) 05/20/2021   HCT  26.2 (L) 05/20/2021   MCV 101.9 (H) 05/20/2021   PLT 136 (L) 05/20/2021    Lab Results  Component Value Date   CREATININE 0.65 05/20/2021    No results found for: PSA  No results found for: TESTOSTERONE  Lab Results  Component Value Date   HGBA1C 5.4 09/24/2018    Urinalysis    Component Value Date/Time   COLORURINE YELLOW 01/04/2021 1424   APPEARANCEUR Hazy (A) 03/01/2021 1415   LABSPEC 1.014 01/04/2021 1424   PHURINE 5.0 01/04/2021 1424   GLUCOSEU Negative 03/01/2021 1415   HGBUR LARGE (A) 01/04/2021 1424   BILIRUBINUR Negative 03/01/2021 1415   KETONESUR 20 (A) 01/04/2021 1424   PROTEINUR 2+ (A) 03/01/2021 1415   PROTEINUR 30 (A) 01/04/2021 1424   NITRITE Negative 03/01/2021 1415   NITRITE NEGATIVE 01/04/2021 1424   LEUKOCYTESUR 1+ (A) 03/01/2021 1415   LEUKOCYTESUR LARGE (A) 01/04/2021 1424    Lab Results  Component Value Date   LABMICR See below: 03/01/2021   WBCUA 11-30 (A) 03/01/2021   LABEPIT 0-10 03/01/2021   MUCUS Present 03/01/2021   BACTERIA Few 03/01/2021    Pertinent Imaging:  No results found for this or any previous visit.  No results found for this or any previous visit.  No results found for this or any previous visit.  No results found for this or any previous visit.  No results found for this or any previous visit.  No results found for this or any previous visit.  No results found for this or any previous visit.  Results for orders placed during the hospital encounter of 01/04/21  CT Renal Stone Study  Narrative CLINICAL DATA:  65 year old with left abdominal pain and hematuria.  EXAM: CT ABDOMEN AND PELVIS WITHOUT CONTRAST  TECHNIQUE: Multidetector CT imaging of the abdomen and pelvis was performed following the standard protocol without IV contrast.  COMPARISON:  None.  FINDINGS: Lower chest: Emphysematous changes at the lung bases. No pleural effusions.  Hepatobiliary: Normal appearance of the liver and  gallbladder.  Pancreas: Unremarkable. No pancreatic ductal dilatation or surrounding inflammatory changes.  Spleen: Normal in size without focal abnormality.  Adrenals/Urinary Tract: No gross abnormality to the adrenal glands. Negative for kidney stones or hydronephrosis. There is gas in the urinary bladder.  Stomach/Bowel: Long segment of the sigmoid colon is markedly abnormal and appears to have diffuse wall thickening but poorly characterized without IV or oral contrast. This segment measures roughly 9.6 cm in length. Lumen of this abnormal sigmoid colon appears to markedly narrowed. Prominent mesocolon lymph nodes around the sigmoid colon. Few diverticula associated with this abnormal sigmoid colon. Moderate amount of stool in the rectum distal to the sigmoid colon thickening. Diverticula involving the left colon. Normal appearance of the stomach. No  significant small bowel dilatation.  Vascular/Lymphatic: Atherosclerotic calcifications in the aorta without aneurysm. Slightly prominent lymph nodes in the sigmoid mesocolon region.  Reproductive: Uterus appears to be present but very poorly characterized on this exam without intravenous contrast. There is a left adnexal mass that contains a focal fluid collection and small pockets of gas. The left adnexal fluid collection does not contain gas and measures 3.5 x 2.5 cm on sequence 2, image 54. The left adnexal mass appears to be contiguous with an extraluminal collection containing gas in the central aspect of the lower abdomen on sequence 2 image 49. This extraluminal collection containing gas roughly measures 3.3 x 2.3 cm. No gross abnormality to the right adnexal region.  Other: Abnormal soft tissue centered around the sigmoid colon, top of the urinary bladder and uterine region. No significant ascites.  Musculoskeletal: No acute bone abnormality.  IMPRESSION: 1. Complex inflammatory process in the pelvis centered  around the sigmoid colon. Long segment of sigmoid colon appears to be diffusely abnormal and thickened. Abnormal appearance of the left adnexa containing a focal fluid collection and small pockets of gas. In addition, there is an irregular extraluminal collection in the central lower abdomen that contains gas and appears to be associated with the left adnexal lesion/inflammatory process. There is also abnormal soft tissue along the bladder dome and there is gas within the bladder. Findings are suggestive for an underlying bowel perforation originating from the sigmoid colon with abscesses and fistula connections to the left adnexa and potentially the urinary bladder. Etiology could be secondary to diverticulitis but cannot exclude a colonic neoplasm. Recommend further characterization with a CT of the abdomen and pelvis with IV contrast. 2. Aortic Atherosclerosis (ICD10-I70.0) and Emphysema (ICD10-J43.9).  These results were called by telephone at the time of interpretation on 01/04/2021 at 3:59 pm to provider [facility] , who verbally acknowledged these results.   Electronically Signed By: Richarda Overlie M.D. On: 01/04/2021 16:05   Assessment & Plan:    1. Colovesical fistula -resolved. Samples of mirabegron 25mg  given for bladder spasms   No follow-ups on file.  , MD  [facility] [place]

## 2021-06-02 NOTE — Patient Instructions (Signed)

## 2021-06-02 NOTE — Progress Notes (Signed)
Catheter Removal  Patient is present today for a catheter removal.  59ml of water was drained from the balloon. A 16FR foley cath was removed from the bladder no complications were noted . Patient tolerated well.  Performed by: Marchelle Folks RN

## 2021-06-02 NOTE — Progress Notes (Signed)
Pt has cathter today

## 2021-06-08 ENCOUNTER — Ambulatory Visit (INDEPENDENT_AMBULATORY_CARE_PROVIDER_SITE_OTHER): Payer: Medicare Other | Admitting: Nurse Practitioner

## 2021-06-08 ENCOUNTER — Encounter: Payer: Self-pay | Admitting: Nurse Practitioner

## 2021-06-08 ENCOUNTER — Other Ambulatory Visit: Payer: Self-pay

## 2021-06-08 VITALS — BP 130/60 | HR 76 | Temp 97.2°F | Ht 64.0 in | Wt 110.4 lb

## 2021-06-08 DIAGNOSIS — J3089 Other allergic rhinitis: Secondary | ICD-10-CM | POA: Insufficient documentation

## 2021-06-08 DIAGNOSIS — Z23 Encounter for immunization: Secondary | ICD-10-CM | POA: Diagnosis not present

## 2021-06-08 DIAGNOSIS — R030 Elevated blood-pressure reading, without diagnosis of hypertension: Secondary | ICD-10-CM | POA: Insufficient documentation

## 2021-06-08 DIAGNOSIS — F410 Panic disorder [episodic paroxysmal anxiety] without agoraphobia: Secondary | ICD-10-CM | POA: Diagnosis not present

## 2021-06-08 DIAGNOSIS — D6489 Other specified anemias: Secondary | ICD-10-CM

## 2021-06-08 MED ORDER — CLONAZEPAM 1 MG PO TABS: 1.0000 mg | ORAL_TABLET | Freq: Every day | ORAL | 0 refills | Status: DC | PRN

## 2021-06-08 NOTE — Assessment & Plan Note (Signed)
Chronic.  Has done well with as needed Valium 2.5 mg.  I discussed this at length with her today.  I am worried that this medication is very strong for somebody over the age of 84.  I explained this to the patient.  She does take this sparingly- less than once per week.  PDMP reviewed.  She is agreeable to consider clonazepam 1 mg tablets instead, prescription sent into pharmacy.  Follow-up if this is not useful.  Discussed daily medication as she is not interested at this time.

## 2021-06-08 NOTE — Patient Instructions (Signed)
BP goal is less than 130/80; please let me know if it is higher than that.

## 2021-06-08 NOTE — Progress Notes (Signed)
Subjective:    Patient ID: Brandi Rowe, female    DOB: [birth year], 65 y.o.   MRN: [id number]  HPI: Brandi Rowe is a 65 y.o. female presenting for medication review.    Chief Complaint  Patient presents with   Follow-up    Medication review/flu shot   Patient reports since discharge from the hospital, she has been taking ferrous sulfate and vitamin B12 because they told her this was low in the hospital.  It sounds like she was anemic postsurgery related to blood loss.  She is also requesting a refill of Valium-she takes this extremely sparingly as needed.  Her triggers are storms and traveling.  She declined starting on a daily medication to help with anxiety and reports worry about side effects.  She takes Valium less than once per week.  ALLERGIES Takes Benadryl as needed.  Worried about taking medication daily because worried it would make her heart race. Duration: chronic Runny nose: no  Nasal congestion: yes Nasal itching: no Sneezing: no Eye swelling, itching or discharge: no Post nasal drip: no Cough: No Sinus pressure:  Yes, left side   Ear pain: yes -left side with ringing in the ear Ear pressure: no Fever: no Symptoms occur seasonally: no Symptoms occur perenially: yes  Patient reports overall, the surgery went well.  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  She is hopeful for reversal.  She is wondering if she should start checking her blood pressure at home.  Allergies  Allergen Reactions   Grass Extracts [Gramineae Pollens]     Dust, mold, mites    Outpatient Encounter Medications as of 06/08/2021  Medication Sig   acetaminophen (TYLENOL) 500 MG tablet Take 500 mg by mouth 2 (two) times daily as needed for moderate pain or headache.   aspirin EC 81 MG tablet Take 81 mg by mouth daily.   calcium carbonate (TUMS - DOSED IN MG ELEMENTAL CALCIUM) 500 MG chewable tablet Chew 1 tablet by mouth daily as needed for indigestion or heartburn.    clonazePAM (KLONOPIN) 1 MG tablet Take 1 tablet (1 mg total) by mouth daily as needed for anxiety.   diphenhydrAMINE (BENADRYL) 25 MG tablet Take 25 mg by mouth at bedtime as needed for allergies.   ferrous sulfate 325 (65 FE) MG EC tablet Take 1 tablet (325 mg total) by mouth 2 (two) times daily.   folic acid (FOLVITE) 400 MCG tablet Take 400 mcg by mouth daily.   Polyvinyl Alcohol-Povidone (REFRESH OP) Place 1 drop into both eyes daily as needed (dry eyes).   vitamin B-12 (CYANOCOBALAMIN) 1000 MCG tablet Take 1 tablet (1,000 mcg total) by mouth daily.   [DISCONTINUED] mirabegron ER (MYRBETRIQ) 25 MG TB24 tablet Take 1 tablet (25 mg total) by mouth daily. (Patient not taking: Reported on 06/08/2021)   [DISCONTINUED] traMADol (ULTRAM) 50 MG tablet Take 1 tablet (50 mg total) by mouth every 6 (six) hours as needed. (Patient not taking: Reported on 06/08/2021)   No facility-administered encounter medications on file as of 06/08/2021.    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 Interim medical history since our last visit reviewed.  Review of Systems Per HPI unless specifically indicated above     Objective:    BP 130/60   Pulse 76   Temp  (!) 97.2 F (36.2 C)   Ht 5\' 4"  (1.626 m)   Wt 110 lb 6.4 oz (50.1 kg)   SpO2 (!) 85%   BMI 18.95 kg/m   Wt Readings from Last 3 Encounters:  06/08/21 110 lb 6.4 oz (50.1 kg)  06/02/21 111 lb 6.4 oz (50.5 kg)  05/30/21 111 lb (50.3 kg)    Physical Exam Vitals and nursing note reviewed.  Constitutional:      General: She is not in acute distress.    Appearance: Normal appearance. She is not toxic-appearing.  HENT:     Head: Normocephalic and atraumatic.     Right Ear: Tympanic membrane, ear canal and external ear normal.     Left Ear: Ear canal and external ear normal. A middle ear effusion is present. Tympanic membrane is not perforated, erythematous, retracted or bulging.     Nose: No congestion or rhinorrhea.     Left Sinus: Maxillary sinus tenderness present.     Mouth/Throat:     Mouth: Mucous membranes are moist.     Pharynx: Oropharynx is clear. No oropharyngeal exudate or posterior oropharyngeal erythema.  Eyes:     General: No scleral icterus.    Extraocular Movements: Extraocular movements intact.  Cardiovascular:     Rate and Rhythm: Normal rate and regular rhythm.     Heart sounds: Normal heart sounds. No murmur heard. Pulmonary:     Effort: Pulmonary effort is normal. No respiratory distress.     Breath sounds: Normal breath sounds. No wheezing, rhonchi or rales.  Skin:    General: Skin is warm and dry.     Capillary Refill: Capillary refill takes less than 2 seconds.     Coloration: Skin is not jaundiced or pale.     Findings: No erythema.  Neurological:     Mental Status: She is alert and oriented to person, place, and time.     Motor: No weakness.     Gait: Gait normal.  Psychiatric:        Mood and Affect: Mood normal.        Behavior: Behavior normal.        Thought Content: Thought content normal.        Judgment: Judgment normal.       Assessment & Plan:   Problem List Items Addressed This Visit       Respiratory   Non-seasonal allergic  rhinitis - Primary    Chronic.  Discussed use of daily nondrowsy oral antihistamine like cetirizine, loratadine.  Also discussed use of daily intranasal corticosteroid like Nasonex/Nasacort/Flonase.  Patient worries about side effects of daily medications.  However, she is agreeable to try these medications as needed.  Follow-up with no improvement.        Other   Other specified anemias    Acute.  Suspected related to surgical blood loss.  We will plan to continue iron  supplementation with vitamin B12.  Plan to follow-up in 2 months to recheck blood counts with iron and folate/B12 panel.      Elevated blood pressure reading without diagnosis of hypertension    I have asked the patient to check her blood pressure a few times weekly at home.  We discussed at length proper technique for checking her blood pressure at home.  She is to notify me with readings greater than 130/80.      Anxiety disorder, unspecified    Chronic.  Has done well with as needed Valium 2.5 mg.  I discussed this at length with her today.  I am worried that this medication is very strong for somebody over the age of 98.  I explained this to the patient.  She does take this sparingly- less than once per week.  PDMP reviewed.  She is agreeable to consider clonazepam 1 mg tablets instead, prescription sent into pharmacy.  Follow-up if this is not useful.  Discussed daily medication as she is not interested at this time.      Relevant Medications   clonazePAM (KLONOPIN) 1 MG tablet   Other Visit Diagnoses     Need for immunization against influenza       Relevant Orders   Flu Vaccine QUAD 80mo+IM (Fluarix, Fluzone & Alfiuria Quad PF) (Completed)        Follow up plan: Return in about 2 months (around 08/08/2021) for check blood counts, touch base.

## 2021-06-08 NOTE — Assessment & Plan Note (Signed)
I have asked the patient to check her blood pressure a few times weekly at home.  We discussed at length proper technique for checking her blood pressure at home.  She is to notify me with readings greater than 130/80.

## 2021-06-08 NOTE — Assessment & Plan Note (Signed)
Chronic.  Discussed use of daily nondrowsy oral antihistamine like cetirizine, loratadine.  Also discussed use of daily intranasal corticosteroid like Nasonex/Nasacort/Flonase.  Patient worries about side effects of daily medications.  However, she is agreeable to try these medications as needed.  Follow-up with no improvement.

## 2021-06-08 NOTE — Assessment & Plan Note (Signed)
Acute.  Suspected related to surgical blood loss.  We will plan to continue iron supplementation with vitamin B12.  Plan to follow-up in 2 months to recheck blood counts with iron and folate/B12 panel.

## 2021-06-22 NOTE — Consult Note (Signed)
  Please allow this to serve as a note to address the intraoperative evaluation I did for this patient  I did not perform surgery per se I did come into the surgery as planned but my intraoperative evaluation did not result in any surgical procedures My advice is contained in Dr. 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  I came into the case and my evaluation consisted of dissecting out the left ureter as it came over the left pelvic brim and making the recommendation that it was not safe to remove the left tube and ovary or the uterus because the ureter cannot be tracked along the pelvic sidewall due to the damage caused by the diverticular abscess  My recommendation is I do not think even after a diverting colostomy that it would be reasonable to do a hysterectomy or removal of the left tube and ovary, the chances of pelvic sidewall injury is too great  I do think that once the diverting colostomy is allowed to heal and the fistula secondarily heals that a reanastomosis can be performed  Dr. Lovell Sheehan agreed with that evaluation and agreed with my assessment that it would be unsafe to do a hysterectomy in this clinical setting and that I do not believe it will improve in the future  The patient has been informed of this directly understands the reason for my opinion and will seek my care in the future should it be needed  For this reason, I do not feel I did any surgery and no charge is appropriate  Lazaro Arms, MD 06/22/2021 9:57 AM

## 2021-07-17 ENCOUNTER — Encounter (HOSPITAL_COMMUNITY): Payer: Self-pay

## 2021-07-17 ENCOUNTER — Emergency Department (HOSPITAL_COMMUNITY): Payer: Medicare Other

## 2021-07-17 ENCOUNTER — Inpatient Hospital Stay (HOSPITAL_COMMUNITY)
Admission: EM | Admit: 2021-07-17 | Discharge: 2021-07-19 | DRG: 390 | Disposition: A | Discharge status: Home / Self Care | Payer: Medicare Other | Attending: Internal Medicine | Admitting: Internal Medicine

## 2021-07-17 ENCOUNTER — Other Ambulatory Visit: Payer: Self-pay

## 2021-07-17 DIAGNOSIS — Z978 Presence of other specified devices: Secondary | ICD-10-CM

## 2021-07-17 DIAGNOSIS — Z91048 Other nonmedicinal substance allergy status: Secondary | ICD-10-CM

## 2021-07-17 DIAGNOSIS — Z20822 Contact with and (suspected) exposure to covid-19: Secondary | ICD-10-CM | POA: Diagnosis present

## 2021-07-17 DIAGNOSIS — Z7982 Long term (current) use of aspirin: Secondary | ICD-10-CM

## 2021-07-17 DIAGNOSIS — E876 Hypokalemia: Secondary | ICD-10-CM | POA: Diagnosis present

## 2021-07-17 DIAGNOSIS — F17210 Nicotine dependence, cigarettes, uncomplicated: Secondary | ICD-10-CM | POA: Diagnosis present

## 2021-07-17 DIAGNOSIS — K56600 Partial intestinal obstruction, unspecified as to cause: Secondary | ICD-10-CM | POA: Diagnosis not present

## 2021-07-17 DIAGNOSIS — K56609 Unspecified intestinal obstruction, unspecified as to partial versus complete obstruction: Secondary | ICD-10-CM | POA: Diagnosis not present

## 2021-07-17 DIAGNOSIS — Z66 Do not resuscitate: Secondary | ICD-10-CM | POA: Diagnosis present

## 2021-07-17 DIAGNOSIS — F419 Anxiety disorder, unspecified: Secondary | ICD-10-CM | POA: Diagnosis present

## 2021-07-17 DIAGNOSIS — Z79899 Other long term (current) drug therapy: Secondary | ICD-10-CM

## 2021-07-17 DIAGNOSIS — Z933 Colostomy status: Secondary | ICD-10-CM

## 2021-07-17 DIAGNOSIS — I480 Paroxysmal atrial fibrillation: Secondary | ICD-10-CM | POA: Diagnosis present

## 2021-07-17 DIAGNOSIS — F17200 Nicotine dependence, unspecified, uncomplicated: Secondary | ICD-10-CM | POA: Diagnosis present

## 2021-07-17 DIAGNOSIS — I4891 Unspecified atrial fibrillation: Secondary | ICD-10-CM

## 2021-07-17 DIAGNOSIS — Z8249 Family history of ischemic heart disease and other diseases of the circulatory system: Secondary | ICD-10-CM

## 2021-07-17 DIAGNOSIS — Z9049 Acquired absence of other specified parts of digestive tract: Secondary | ICD-10-CM

## 2021-07-17 HISTORY — DX: Vesicointestinal fistula: N32.1

## 2021-07-17 HISTORY — DX: Tobacco use: Z72.0

## 2021-07-17 HISTORY — DX: Other female intestinal-genital tract fistulae: N82.4

## 2021-07-17 HISTORY — DX: Paroxysmal atrial fibrillation: I48.0

## 2021-07-17 LAB — COMPREHENSIVE METABOLIC PANEL
ALT: 14 U/L (ref 0–44)
AST: 20 U/L (ref 15–41)
Albumin: 5.1 g/dL — ABNORMAL HIGH (ref 3.5–5.0)
Alkaline Phosphatase: 89 U/L (ref 38–126)
Anion gap: 18 — ABNORMAL HIGH (ref 5–15)
BUN: 18 mg/dL (ref 8–23)
CO2: 22 mmol/L (ref 22–32)
Calcium: 10.7 mg/dL — ABNORMAL HIGH (ref 8.9–10.3)
Chloride: 97 mmol/L — ABNORMAL LOW (ref 98–111)
Creatinine, Ser: 0.79 mg/dL (ref 0.44–1.00)
GFR, Estimated: >60 mL/min (ref >60)
Glucose, Bld: 167 mg/dL — ABNORMAL HIGH (ref 70–99)
Potassium: 3.2 mmol/L — ABNORMAL LOW (ref 3.5–5.1)
Sodium: 137 mmol/L (ref 135–145)
Total Bilirubin: 0.5 mg/dL (ref 0.3–1.2)
Total Protein: 8.4 g/dL — ABNORMAL HIGH (ref 6.5–8.1)

## 2021-07-17 LAB — CBC WITH DIFFERENTIAL/PLATELET
Abs Immature Granulocytes: 0.05 10*3/uL (ref 0.00–0.07)
Basophils Absolute: 0.1 10*3/uL (ref 0.0–0.1)
Basophils Relative: 1 %
Eosinophils Absolute: 0.2 10*3/uL (ref 0.0–0.5)
Eosinophils Relative: 1 %
HCT: 45.6 % (ref 36.0–46.0)
Hemoglobin: 15.8 g/dL — ABNORMAL HIGH (ref 12.0–15.0)
Immature Granulocytes: 0 %
Lymphocytes Relative: 14 %
Lymphs Abs: 2.2 10*3/uL (ref 0.7–4.0)
MCH: 33.6 pg (ref 26.0–34.0)
MCHC: 34.6 g/dL (ref 30.0–36.0)
MCV: 97 fL (ref 80.0–100.0)
Monocytes Absolute: 0.6 10*3/uL (ref 0.1–1.0)
Monocytes Relative: 4 %
Neutro Abs: 12.4 10*3/uL — ABNORMAL HIGH (ref 1.7–7.7)
Neutrophils Relative %: 80 %
Platelets: 252 10*3/uL (ref 150–400)
RBC: 4.7 MIL/uL (ref 3.87–5.11)
RDW: 13.8 % (ref 11.5–15.5)
WBC: 15.6 10*3/uL — ABNORMAL HIGH (ref 4.0–10.5)
nRBC: 0 % (ref 0.0–0.2)

## 2021-07-17 LAB — RESP PANEL BY RT-PCR (FLU A&B, COVID) ARPGX2
Influenza A by PCR: NEGATIVE
Influenza B by PCR: NEGATIVE
SARS Coronavirus 2 by RT PCR: NEGATIVE

## 2021-07-17 LAB — LIPASE, BLOOD: Lipase: 33 U/L (ref 11–51)

## 2021-07-17 MED ORDER — HYDROMORPHONE HCL 1 MG/ML IJ SOLN
0.5000 mg | Freq: Once | INTRAMUSCULAR | Status: AC
  Administered 2021-07-17: 23:00:00 0.5 mg via INTRAVENOUS
  Filled 2021-07-17: qty 1

## 2021-07-17 MED ORDER — METOPROLOL TARTRATE 5 MG/5ML IV SOLN: 2.5000 mg | Freq: Once | INTRAVENOUS | Status: AC

## 2021-07-17 MED ORDER — IOHEXOL 300 MG/ML  SOLN
100.0000 mL | Freq: Once | INTRAMUSCULAR | Status: AC | PRN
  Administered 2021-07-17: 100 mL via INTRAVENOUS

## 2021-07-17 MED ORDER — LACTATED RINGERS IV BOLUS
1000.0000 mL | Freq: Once | INTRAVENOUS | Status: AC
  Administered 2021-07-18: 01:00:00 1000 mL via INTRAVENOUS

## 2021-07-17 MED ORDER — PIPERACILLIN-TAZOBACTAM 3.375 G IVPB 30 MIN
3.3750 g | Freq: Once | INTRAVENOUS | Status: AC
  Administered 2021-07-18: 3.375 g via INTRAVENOUS
  Filled 2021-07-17: qty 50

## 2021-07-17 MED ORDER — ONDANSETRON HCL 4 MG/2ML IJ SOLN
4.0000 mg | Freq: Once | INTRAMUSCULAR | Status: AC
  Administered 2021-07-17: 23:00:00 4 mg via INTRAVENOUS
  Filled 2021-07-17: qty 2

## 2021-07-17 MED ORDER — METOPROLOL TARTRATE 5 MG/5ML IV SOLN
INTRAVENOUS | Status: AC
  Administered 2021-07-17: 23:00:00 2.5 mg via INTRAVENOUS
  Filled 2021-07-17: qty 5

## 2021-07-17 NOTE — ED Triage Notes (Addendum)
Pt to ED by POV from home with c/o abdominal pain. 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 Pt reports decreased output in colostomy bag and severe abdominal pain.Pt also endorses blood in her emesis.  HR is 185 in triage, however fluctuates, all other VSS.

## 2021-07-17 NOTE — ED Notes (Signed)
Pt HR noted to be 190s when placed on the monitor. Zoll applied, EDP to bedside. See orders.

## 2021-07-18 ENCOUNTER — Inpatient Hospital Stay (HOSPITAL_COMMUNITY): Payer: Medicare Other

## 2021-07-18 ENCOUNTER — Encounter (HOSPITAL_COMMUNITY): Payer: Self-pay | Admitting: Family Medicine

## 2021-07-18 DIAGNOSIS — Z933 Colostomy status: Secondary | ICD-10-CM | POA: Diagnosis not present

## 2021-07-18 DIAGNOSIS — Z9049 Acquired absence of other specified parts of digestive tract: Secondary | ICD-10-CM | POA: Diagnosis not present

## 2021-07-18 DIAGNOSIS — Z79899 Other long term (current) drug therapy: Secondary | ICD-10-CM | POA: Diagnosis not present

## 2021-07-18 DIAGNOSIS — K56600 Partial intestinal obstruction, unspecified as to cause: Secondary | ICD-10-CM | POA: Diagnosis present

## 2021-07-18 DIAGNOSIS — Z20822 Contact with and (suspected) exposure to covid-19: Secondary | ICD-10-CM | POA: Diagnosis present

## 2021-07-18 DIAGNOSIS — I4891 Unspecified atrial fibrillation: Secondary | ICD-10-CM

## 2021-07-18 DIAGNOSIS — Z66 Do not resuscitate: Secondary | ICD-10-CM | POA: Diagnosis present

## 2021-07-18 DIAGNOSIS — I480 Paroxysmal atrial fibrillation: Secondary | ICD-10-CM | POA: Diagnosis present

## 2021-07-18 DIAGNOSIS — Z91048 Other nonmedicinal substance allergy status: Secondary | ICD-10-CM | POA: Diagnosis not present

## 2021-07-18 DIAGNOSIS — Z7982 Long term (current) use of aspirin: Secondary | ICD-10-CM | POA: Diagnosis not present

## 2021-07-18 DIAGNOSIS — F419 Anxiety disorder, unspecified: Secondary | ICD-10-CM | POA: Diagnosis present

## 2021-07-18 DIAGNOSIS — K56609 Unspecified intestinal obstruction, unspecified as to partial versus complete obstruction: Secondary | ICD-10-CM

## 2021-07-18 DIAGNOSIS — E785 Hyperlipidemia, unspecified: Secondary | ICD-10-CM | POA: Diagnosis not present

## 2021-07-18 DIAGNOSIS — E876 Hypokalemia: Secondary | ICD-10-CM | POA: Diagnosis present

## 2021-07-18 DIAGNOSIS — Z8249 Family history of ischemic heart disease and other diseases of the circulatory system: Secondary | ICD-10-CM | POA: Diagnosis not present

## 2021-07-18 DIAGNOSIS — F17210 Nicotine dependence, cigarettes, uncomplicated: Secondary | ICD-10-CM | POA: Diagnosis present

## 2021-07-18 LAB — ECHOCARDIOGRAM COMPLETE
AR max vel: 2.13 cm2
AV Area VTI: 2.44 cm2
AV Area mean vel: 2.02 cm2
AV Mean grad: 4 mmHg
AV Peak grad: 7.4 mmHg
Ao pk vel: 1.36 m/s
Area-P 1/2: 3.03 cm2
Calc EF: 53.5 %
Height: 63 in
MV VTI: 4.18 cm2
S' Lateral: 3 cm
Single Plane A2C EF: 56.8 %
Single Plane A4C EF: 51.3 %
Weight: 1865.97 oz

## 2021-07-18 LAB — COMPREHENSIVE METABOLIC PANEL
ALT: 13 U/L (ref 0–44)
AST: 19 U/L (ref 15–41)
Albumin: 3.6 g/dL (ref 3.5–5.0)
Alkaline Phosphatase: 62 U/L (ref 38–126)
Anion gap: 7 (ref 5–15)
BUN: 18 mg/dL (ref 8–23)
CO2: 27 mmol/L (ref 22–32)
Calcium: 8.9 mg/dL (ref 8.9–10.3)
Chloride: 103 mmol/L (ref 98–111)
Creatinine, Ser: 0.71 mg/dL (ref 0.44–1.00)
GFR, Estimated: >60 mL/min (ref >60)
Glucose, Bld: 126 mg/dL — ABNORMAL HIGH (ref 70–99)
Potassium: 4.2 mmol/L (ref 3.5–5.1)
Sodium: 137 mmol/L (ref 135–145)
Total Bilirubin: 0.4 mg/dL (ref 0.3–1.2)
Total Protein: 6 g/dL — ABNORMAL LOW (ref 6.5–8.1)

## 2021-07-18 LAB — CBC WITH DIFFERENTIAL/PLATELET
Abs Immature Granulocytes: 0.02 10*3/uL (ref 0.00–0.07)
Basophils Absolute: 0 10*3/uL (ref 0.0–0.1)
Basophils Relative: 0 %
Eosinophils Absolute: 0 10*3/uL (ref 0.0–0.5)
Eosinophils Relative: 1 %
HCT: 36.2 % (ref 36.0–46.0)
Hemoglobin: 12.1 g/dL (ref 12.0–15.0)
Immature Granulocytes: 0 %
Lymphocytes Relative: 10 %
Lymphs Abs: 0.8 10*3/uL (ref 0.7–4.0)
MCH: 32.4 pg (ref 26.0–34.0)
MCHC: 33.4 g/dL (ref 30.0–36.0)
MCV: 97.1 fL (ref 80.0–100.0)
Monocytes Absolute: 0.4 10*3/uL (ref 0.1–1.0)
Monocytes Relative: 6 %
Neutro Abs: 6.3 10*3/uL (ref 1.7–7.7)
Neutrophils Relative %: 83 %
Platelets: 166 10*3/uL (ref 150–400)
RBC: 3.73 MIL/uL — ABNORMAL LOW (ref 3.87–5.11)
RDW: 13.9 % (ref 11.5–15.5)
WBC: 7.6 10*3/uL (ref 4.0–10.5)
nRBC: 0 % (ref 0.0–0.2)

## 2021-07-18 LAB — LIPID PANEL
Cholesterol: 201 mg/dL — ABNORMAL HIGH (ref 0–200)
HDL: 78 mg/dL (ref >40)
LDL Cholesterol: 82 mg/dL (ref 0–99)
Total CHOL/HDL Ratio: 2.6 RATIO
Triglycerides: 206 mg/dL — ABNORMAL HIGH (ref <150)
VLDL: 41 mg/dL — ABNORMAL HIGH (ref 0–40)

## 2021-07-18 LAB — TSH: TSH: 1.201 u[IU]/mL (ref 0.350–4.500)

## 2021-07-18 LAB — LACTIC ACID, PLASMA
Lactic Acid, Venous: 1.4 mmol/L (ref 0.5–1.9)
Lactic Acid, Venous: 1.8 mmol/L (ref 0.5–1.9)

## 2021-07-18 LAB — MRSA NEXT GEN BY PCR, NASAL: MRSA by PCR Next Gen: NOT DETECTED

## 2021-07-18 LAB — PROTIME-INR
INR: 0.9 (ref 0.8–1.2)
Prothrombin Time: 12.6 seconds (ref 11.4–15.2)

## 2021-07-18 LAB — MAGNESIUM: Magnesium: 1.5 mg/dL — ABNORMAL LOW (ref 1.7–2.4)

## 2021-07-18 MED ORDER — ONDANSETRON HCL 4 MG PO TABS: 4.0000 mg | ORAL_TABLET | Freq: Four times a day (QID) | ORAL | Status: DC | PRN

## 2021-07-18 MED ORDER — CHLORHEXIDINE GLUCONATE CLOTH 2 % EX PADS
6.0000 | MEDICATED_PAD | Freq: Every day | CUTANEOUS | Status: DC
  Administered 2021-07-18 – 2021-07-19 (×2): 6 via TOPICAL

## 2021-07-18 MED ORDER — POTASSIUM CHLORIDE 10 MEQ/100ML IV SOLN
10.0000 meq | INTRAVENOUS | Status: DC
  Filled 2021-07-18 (×4): qty 100

## 2021-07-18 MED ORDER — ACETAMINOPHEN 650 MG RE SUPP: 650.0000 mg | Freq: Four times a day (QID) | RECTAL | Status: DC | PRN

## 2021-07-18 MED ORDER — DILTIAZEM HCL-DEXTROSE 125-5 MG/125ML-% IV SOLN (PREMIX)
5.0000 mg/h | INTRAVENOUS | Status: DC
  Administered 2021-07-18: 5 mg/h via INTRAVENOUS
  Filled 2021-07-18: qty 125

## 2021-07-18 MED ORDER — ONDANSETRON HCL 4 MG/2ML IJ SOLN
4.0000 mg | Freq: Four times a day (QID) | INTRAMUSCULAR | Status: DC | PRN
  Administered 2021-07-18: 14:00:00 4 mg via INTRAVENOUS
  Filled 2021-07-18: qty 2

## 2021-07-18 MED ORDER — SODIUM CHLORIDE 0.9 % IV BOLUS: 500.0000 mL | Freq: Once | INTRAVENOUS | Status: DC

## 2021-07-18 MED ORDER — MORPHINE SULFATE (PF) 2 MG/ML IV SOLN
2.0000 mg | INTRAVENOUS | Status: DC | PRN
  Administered 2021-07-18 (×2): 2 mg via INTRAVENOUS
  Filled 2021-07-18 (×2): qty 1

## 2021-07-18 MED ORDER — DILTIAZEM LOAD VIA INFUSION
10.0000 mg | Freq: Once | INTRAVENOUS | Status: AC
  Administered 2021-07-18: 10 mg via INTRAVENOUS
  Filled 2021-07-18: qty 10

## 2021-07-18 MED ORDER — MAGNESIUM SULFATE 4 GM/100ML IV SOLN
4.0000 g | Freq: Once | INTRAVENOUS | Status: AC
  Administered 2021-07-18: 12:00:00 4 g via INTRAVENOUS
  Filled 2021-07-18: qty 100

## 2021-07-18 MED ORDER — LORAZEPAM 2 MG/ML IJ SOLN: 1.0000 mg | Freq: Three times a day (TID) | INTRAMUSCULAR | Status: DC | PRN

## 2021-07-18 MED ORDER — PIPERACILLIN-TAZOBACTAM 3.375 G IVPB
3.3750 g | Freq: Three times a day (TID) | INTRAVENOUS | Status: DC
  Administered 2021-07-18 – 2021-07-19 (×4): 3.375 g via INTRAVENOUS
  Filled 2021-07-18 (×4): qty 50

## 2021-07-18 MED ORDER — ACETAMINOPHEN 325 MG PO TABS: 650.0000 mg | ORAL_TABLET | Freq: Four times a day (QID) | ORAL | Status: DC | PRN

## 2021-07-18 MED ORDER — PROCHLORPERAZINE EDISYLATE 10 MG/2ML IJ SOLN
5.0000 mg | Freq: Once | INTRAMUSCULAR | Status: AC
  Administered 2021-07-18: 01:00:00 5 mg via INTRAVENOUS
  Filled 2021-07-18: qty 2

## 2021-07-18 MED ORDER — POLYETHYLENE GLYCOL 3350 17 G PO PACK
17.0000 g | PACK | Freq: Two times a day (BID) | ORAL | Status: DC
  Administered 2021-07-18 – 2021-07-19 (×3): 17 g via ORAL
  Filled 2021-07-18 (×3): qty 1

## 2021-07-18 MED ORDER — HYDROMORPHONE HCL 1 MG/ML IJ SOLN
1.0000 mg | Freq: Once | INTRAMUSCULAR | Status: AC
Start: 2021-07-18 — End: 2021-07-18
  Administered 2021-07-18: 1 mg via INTRAVENOUS
  Filled 2021-07-18: qty 1

## 2021-07-18 MED ORDER — METOPROLOL TARTRATE 5 MG/5ML IV SOLN
2.5000 mg | Freq: Four times a day (QID) | INTRAVENOUS | Status: DC
  Administered 2021-07-18 – 2021-07-19 (×4): 2.5 mg via INTRAVENOUS
  Filled 2021-07-18 (×4): qty 5

## 2021-07-18 MED ORDER — SODIUM CHLORIDE 0.9 % IV SOLN: INTRAVENOUS | Status: DC

## 2021-07-18 NOTE — Progress Notes (Incomplete)
*  PRELIMINARY RESULTS* Echocardiogram 2D Echocardiogram has been performed.  Brandi Rowe 07/18/2021, 10:32 AM

## 2021-07-18 NOTE — Consult Note (Signed)
Cardiology Consult    Patient ID: DYSTANY DUFFY MRN: [id number], DOB/AGE: [birth year]   Admit date: 07/17/2021 Date of Consult: 07/18/2021  Primary Physician: Valentino Nose, NP Primary Cardiologist: Chrystie Nose, MD - new Requesting Provider: Maryln Manuel, MD  Patient Profile    Brandi Rowe is a 65 y.o. 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 Laural Benes.  Past Medical History   Past Medical History:  Diagnosis Date   Acute febrile illness 01/04/2021   Anxiety    Colovaginal fistula    Colovesical fistula    a. 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   Dehydration 01/04/2021   Elevated MCV 01/04/2021   Gout    Nausea 01/04/2021   PAF (paroxysmal atrial fibrillation) (HCC)    a. 06/2021 in setting of SBO, HypoK/Mg; b. 06/2021 Echo: EF 50-55%, no rwma, GrI DD, Nl RV size/fxn, RVSP 36.57mmHg. Mod dil RA. Triv MR.   Sepsis (HCC) 01/05/2021   Tobacco abuse     Past Surgical History:  Procedure Laterality Date   BLADDER REPAIR N/A 05/17/2021   Procedure: Closure of Cystotomy;  Surgeon: Malen Gauze, MD;  Location: [facility];  Service: Urology;  Laterality: N/A;   CARPAL TUNNEL RELEASE Right 2002   MANDIBLE SURGERY  1985   PARTIAL COLECTOMY N/A 05/17/2021   Procedure: PARTIAL COLECTOMY WITH COLOSTOMY;  Surgeon: Franky Macho, MD;  Location: [facility];  Service: General;  Laterality: N/A;   PELVIC LYMPH NODE DISSECTION N/A 05/17/2021   Procedure: PELVIC DISSECTION;  Surgeon: Lazaro Arms, MD;  Location: [facility];  Service: Gynecology;  Laterality: N/A;     Allergies  Allergies  Allergen Reactions   Grass Extracts [Gramineae Pollens]     Dust, mold, mites    History of Present Illness    65 year old female with a  history of tobacco abuse, anxiety, and a 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  She lives locally with her husband and does not routinely exercise.  She smokes a few cigarettes a day and has a glass of wine most days of the week.  She has a long history of occasional palpitations previous evaluated by primary care provider with outpatient monitoring, which she notes was unremarkable.  About once every other week, she will experience intermittent heart fluttering or skipped beats, but no sustained tachycardias.  She underwent elective abdominal surgery in October as outlined above and had an uneventful postoperative course.  She has been adjusting to life with a colostomy and overall, things were going well.    On December 25, she noted mild abdominal discomfort that persisted throughout the day and into December 26.  Pain worsened acutely around 4:30 PM on December 26 and became associated with nausea and vomiting.  She presented to the emergency department where CT of the abdomen showed a small bowel obstruction with transition zone seen within the right lower quadrant, and anastomosed bowel postoperative changes consistent with postsurgical status.  She was noted to be in atrial fibrillation with rapid ventricular spots with a heart rate of 185 bpm.  Magnesium was 1.5 with potassium of 3.2.  An NG tube was placed and she was started on intravenous diltiazem with conversion to sinus rhythm.  She was subsequently admitted to the  intensive care unit, where she has had no recurrence of A. fib.  She continues to report moderate abdominal tenderness.  An echocardiogram this morning has shown an EF of 50 to 55% with grade 1 diastolic dysfunction, normal RV size and function, elevated RVSP at 36.7 mmHg, and trivial MR.  Inpatient Medications     Chlorhexidine Gluconate Cloth  6 each Topical Daily    Family History    Family History  Problem Relation Age  of Onset   Congestive Heart Failure Mother    Uterine cancer Mother    Pancreatic cancer Father    Atrial fibrillation Brother    Stroke Maternal Grandmother    Heart disease Maternal Grandmother    Glaucoma Maternal Grandfather    She indicated that her mother is deceased. She indicated that her father is deceased. She indicated that the status of her brother is unknown. She indicated that her maternal grandmother is deceased. She indicated that her maternal grandfather is deceased.   Social History    Social History   Socioeconomic History   Marital status: Divorced    Spouse name: Not on file   Number of children: 0   Years of education: Not on file   Highest education level: Some college, no degree  Occupational History    Employer: [employer]  Tobacco Use   Smoking status: Every Day    Packs/day: 0.50    Years: 46.00    Pack years: 23.00    Types: Cigarettes   Smokeless tobacco: Never  Vaping Use   Vaping Use: Never used  Substance and Sexual Activity   Alcohol use: Yes    Alcohol/week: 7.0 standard drinks    Types: 7 Glasses of wine per week   Drug use: Never   Sexual activity: Not Currently    Partners: Male  Other Topics Concern   Not on file  Social History Narrative   Lives locally with husband.  Does not routinely exercise.   Social Determinants of Health   Financial Resource Strain: Not on file  Food Insecurity: Not on file  Transportation Needs: Not on file  Physical Activity: Not on file  Stress: Not on file  Social Connections: Not on file  Intimate Partner Violence: Not on file     Review of Systems    General:  No chills, fever, night sweats or weight changes.  Cardiovascular:  No chest pain, dyspnea on exertion, edema, orthopnea, +++ palpitations, paroxysmal nocturnal dyspnea. Dermatological: No rash, lesions/masses Respiratory: No cough, dyspnea Urologic: No hematuria, dysuria Abdominal:   +++ nausea, +++ vomiting, +++ abdominal  pain, no diarrhea, bright red blood per rectum, melena, or hematemesis Neurologic:  No visual changes, wkns, changes in mental status. All other systems reviewed and are otherwise negative except as noted above.  Physical Exam    Blood pressure 132/75, pulse 85, temperature 98.5 F (36.9 C), temperature source Oral, resp. rate 15, height 5\' 3"  (1.6 m), weight 52.9 kg, SpO2 96 %.  General: Pleasant, NAD Psych: Normal affect. Neuro: Alert and oriented X 3. Moves all extremities spontaneously. HEENT: Normal  Neck: Supple without bruits or JVD. Lungs:  Resp regular and unlabored, diminished breath sounds bilaterally. Heart: RRR no s3, s4, or murmurs. Abdomen: Soft, diffuse, mild tenderness to light palpation, non-distended, BS + x 4.  Extremities: No clubbing, cyanosis or edema. DP/PT2+, Radials 2+ and equal bilaterally.  Labs       Lab Results  Component Value Date   WBC 7.6 07/18/2021  HGB 12.1 07/18/2021   HCT 36.2 07/18/2021   MCV 97.1 07/18/2021   PLT 166 07/18/2021    Recent Labs  Lab 07/18/21 0432  NA 137  K 4.2  CL 103  CO2 27  BUN 18  CREATININE 0.71  CALCIUM 8.9  PROT 6.0*  BILITOT 0.4  ALKPHOS 62  ALT 13  AST 19  GLUCOSE 126*   Lab Results  Component Value Date   CHOL 221 (H) 09/24/2018   HDL 104 09/24/2018   LDLCALC 101 (H) 09/24/2018   TRIG 69 09/24/2018   Lab Results  Component Value Date   TSH 1.201 07/18/2021    Radiology Studies    DG Abd 1 View  Result Date: 07/18/2021 CLINICAL DATA:  Abdominal pain EXAM: ABDOMEN - 1 VIEW COMPARISON:  CT abdomen and pelvis 07/17/2021 FINDINGS: A few abnormally distended loops of small bowel identified in the mid abdomen measuring up to 3.5 cm in diameter. Left-sided ostomy. No suspicious calcifications identified. Enteric tube tip in the stomach. IMPRESSION: Abnormal bowel gas pattern consistent with the obstruction seen on CT. Continued follow-up recommended. Electronically Signed   By: Ofilia Neas  M.D.   On: 07/18/2021 09:23   CT ABDOMEN PELVIS W CONTRAST  Result Date: 07/18/2021 CLINICAL DATA:  Abdominal pain with decreased output from existing colostomy back. EXAM: CT ABDOMEN AND PELVIS WITH CONTRAST TECHNIQUE: Multidetector CT imaging of the abdomen and pelvis was performed using the standard protocol following bolus administration of intravenous contrast. CONTRAST:  182mL OMNIPAQUE IOHEXOL 300 MG/ML  SOLN COMPARISON:  January 27, 2021 FINDINGS: Lower chest: No acute abnormality. Hepatobiliary: No focal liver abnormality is seen. No gallstones, gallbladder wall thickening, or biliary dilatation. Pancreas: Unremarkable. No pancreatic ductal dilatation or surrounding inflammatory changes. Spleen: Normal in size without focal abnormality. Adrenals/Urinary Tract: Adrenal glands are unremarkable. Kidneys are normal, without renal calculi, focal lesion, or hydronephrosis. The urinary bladder is partially collapsed and subsequently limited in evaluation. Stomach/Bowel: There is a small hiatal hernia. Appendix appears normal. Surgically anastomosed bowel is seen within the right lower quadrant and within the expected region of the mid to distal sigmoid colon. Multiple dilated, fluid-filled loops of small bowel are seen throughout the abdomen and pelvis (maximum small bowel diameter of approximately 3.4 cm). A transition zone is seen within the right lower quadrant, at the area of previously noted surgically anastomosed small bowel (axial CT images 45 through 48, CT series 2). Vascular/Lymphatic: Aortic atherosclerosis. No enlarged abdominal or pelvic lymph nodes. Reproductive: Uterus and bilateral adnexa are unremarkable. Other: There is a left lower quadrant ostomy site. A very small amount of perihepatic fluid is seen. Musculoskeletal: No acute or significant osseous findings. IMPRESSION: 1. Small-bowel obstruction with a transition zone seen within the right lower quadrant, at a site of surgically anastomosed  small bowel. 2. Postoperative changes consistent with partial colectomy and subsequent left lower quadrant ostomy site. 3. Small hiatal hernia. 4. Aortic atherosclerosis. Aortic Atherosclerosis (ICD10-I70.0). Electronically Signed   By: Virgina Norfolk M.D.   On: 07/18/2021 00:01   DG Chest Port 1 View  Result Date: 07/18/2021 CLINICAL DATA:  Confirm nasogastric tube placement. EXAM: PORTABLE CHEST 1 VIEW COMPARISON:  January 04, 2021 FINDINGS: There are multiple overlying cardiac lead wires and mildly radiopaque tags. A nasogastric tube is seen with its distal end extending into the body of the stomach. The distal side hole sits at the approximate level of the gastroesophageal junction. Mild atelectasis is seen within the left lung base. There  is no evidence of a pleural effusion or pneumothorax. The heart size and mediastinal contours are within normal limits. Moderate severity calcification of the aortic arch is noted. The visualized skeletal structures are unremarkable. IMPRESSION: 1. Nasogastric tube with distal end in the body of the stomach. 2. Mild left basilar atelectasis. Electronically Signed   By: Virgina Norfolk M.D.   On: 07/18/2021 01:31   DG Abdomen Acute W/Chest  Result Date: 07/17/2021 CLINICAL DATA:  Chest and abdominal pain, history of prior colectomy and decreased semi output EXAM: DG ABDOMEN ACUTE WITH 1 VIEW CHEST COMPARISON:  01/27/2021 CT, chest x-ray from 01/04/2021 FINDINGS: Cardiac shadow is stable. Aortic calcifications are noted. The lungs are hyperinflated but clear. Scattered large and small bowel gas is noted. Left-sided ostomy is noted new from the prior CT examination. No free air is seen. No obstructive changes are noted. No acute bony abnormality is seen. IMPRESSION: No acute abnormality in the chest and abdomen. Electronically Signed   By: Inez Catalina M.D.   On: 07/17/2021 23:06   ECG & Cardiac Imaging    Atrial fibrillation, 185 bpm, diffuse ST depression-  personally reviewed.  Assessment & Plan    1.  Small bowel obstruction: Status post partial colectomy and colostomy in October 2022 in the setting of colovaginal and colovesical fistulas.  Uneventful postoperative course however, she developed abdominal discomfort beginning December 25 with worsening December 26, prompting admission.  Found to have small bowel obstruction on CT of the abdomen and pelvis.  Feeling better with decompression/NG tube, though still with some abdominal tenderness.  Antibiotics per internal medicine.  Surgery to see this morning.  2.  Paroxysmal atrial fibrillation: In the setting of small bowel obstruction, nausea, vomiting, and resultant hypokalemia (3.2), and hypomagnesemia (1.5), she was found to be atrial fibrillation with rapid ventricular response on arrival.  This subsided with IV metoprolol, diltiazem, and placement of an NG tube for decompression of small bowel obstruction.  On telemetry, she has had no recurrent atrial fibrillation.  Diltiazem was discontinued overnight due to blood pressure dipping to the 80s.  She notes a long history of intermittent palpitations dating back to her 25s, now experiencing brief episodes of fluttering or skipped beats about once every 2 weeks.  No known prior history of A. fib however.  Echo performed this morning showed low-normal LV function.  She is currently n.p.o.  In setting of acute illness, she remains at risk of recurrent atrial fibrillation and I will add metoprolol 2.5 mg IV every 6 hours, which can be converted to metoprolol 12.5 to 25 mg twice daily once she is taking p.o.'s (pending heart rate/blood pressure response).  CHA2DS2VASc equals 2 - 3 with age greater than 35, female gender, and finding of aortic atherosclerosis on CT of the abdomen.  Without prior history of A. fib, and given short duration in the setting of metabolic abnormalities and pain, will hold off on anticoagulation at this time, especially as she may  require additional abdominal procedures.  We will consider outpatient event monitoring to evaluate for additional A. fib and consider oral anticoagulation in the future.  3.  Tobacco abuse: Smoking a few cigarettes a day.  Complete cessation advised.  4.  Alcohol use: Has a loss of wine most days of the week.  In setting of paroxysmal atrial fibrillation, alcohol cessation advised.  5.  Hyperlipidemia: Total cholesterol 221 with an LDL of 101 in 2020.  Aortic atherosclerosis noted on CT.  Follow-up lipids with  low threshold to add statin therapy once taking p.o.'s.  Signed, Murray Hodgkins, NP 07/18/2021, 10:50 AM  For questions or updates, please contact   Please consult [email or web address] for contact info under Cardiology/STEMI.

## 2021-07-18 NOTE — Progress Notes (Signed)
ASSUMPTION OF CARE NOTE   07/18/2021 1:40 PM  Brandi Rowe was seen and examined.  The H&P by the admitting provider, orders, imaging was reviewed.  Please see new orders.  Will continue to follow.  Appreciate surgery and cardiology recommendations.  Updated patient and husband at bedside.  Vitals:   07/18/21 1200 07/18/21 1300  BP: 118/81 115/73  Pulse: 85 76  Resp: 18 20  Temp:    SpO2: 95% 93%    Results for orders placed or performed during the hospital encounter of 07/17/21  Resp Panel by RT-PCR (Flu A&B, Covid) Nasopharyngeal Swab   Specimen: Nasopharyngeal Swab; Nasopharyngeal(NP) swabs in vial transport medium  Result Value Ref Range   SARS Coronavirus 2 by RT PCR NEGATIVE NEGATIVE   Influenza A by PCR NEGATIVE NEGATIVE   Influenza B by PCR NEGATIVE NEGATIVE  Culture, blood (routine x 2)   Specimen: Left Antecubital; Blood  Result Value Ref Range   Specimen Description LEFT ANTECUBITAL    Special Requests      BOTTLES DRAWN AEROBIC AND ANAEROBIC Blood Culture results may not be optimal due to an excessive volume of blood received in culture bottles Performed at [facility], [address], [place], [place] [address]    Culture PENDING    Report Status PENDING   Culture, blood (routine x 2)   Specimen: Left Antecubital; Blood  Result Value Ref Range   Specimen Description BLOOD LEFT HAND    Special Requests      BOTTLES DRAWN AEROBIC AND ANAEROBIC Blood Culture results may not be optimal due to an excessive volume of blood received in culture bottles Performed at [facility], [address], [place], [place] [address]    Culture PENDING    Report Status PENDING   MRSA Next Gen by PCR, Nasal   Specimen: Nasal Mucosa; Nasal Swab  Result Value Ref Range   MRSA by PCR Next Gen NOT DETECTED NOT DETECTED  Comprehensive metabolic panel  Result Value Ref Range   Sodium 137 135 - 145 mmol/L   Potassium 3.2 (L) 3.5 - 5.1 mmol/L   Chloride 97 (L) 98 - 111 mmol/L    CO2 22 22 - 32 mmol/L   Glucose, Bld 167 (H) 70 - 99 mg/dL   BUN 18 8 - 23 mg/dL   Creatinine, Ser 2.83 0.44 - 1.00 mg/dL   Calcium 66.2 (H) 8.9 - 10.3 mg/dL   Total Protein 8.4 (H) 6.5 - 8.1 g/dL   Albumin 5.1 (H) 3.5 - 5.0 g/dL   AST 20 15 - 41 U/L   ALT 14 0 - 44 U/L   Alkaline Phosphatase 89 38 - 126 U/L   Total Bilirubin 0.5 0.3 - 1.2 mg/dL   GFR, Estimated >94 >76 mL/min   Anion gap 18 (H) 5 - 15  Lipase, blood  Result Value Ref Range   Lipase 33 11 - 51 U/L  CBC with Differential  Result Value Ref Range   WBC 15.6 (H) 4.0 - 10.5 K/uL   RBC 4.70 3.87 - 5.11 MIL/uL   Hemoglobin 15.8 (H) 12.0 - 15.0 g/dL   HCT 54.6 50.3 - 54.6 %   MCV 97.0 80.0 - 100.0 fL   MCH 33.6 26.0 - 34.0 pg   MCHC 34.6 30.0 - 36.0 g/dL   RDW 56.8 12.7 - 51.7 %   Platelets 252 150 - 400 K/uL   nRBC 0.0 0.0 - 0.2 %   Neutrophils Relative % 80 %  Neutro Abs 12.4 (H) 1.7 - 7.7 K/uL   Lymphocytes Relative 14 %   Lymphs Abs 2.2 0.7 - 4.0 K/uL   Monocytes Relative 4 %   Monocytes Absolute 0.6 0.1 - 1.0 K/uL   Eosinophils Relative 1 %   Eosinophils Absolute 0.2 0.0 - 0.5 K/uL   Basophils Relative 1 %   Basophils Absolute 0.1 0.0 - 0.1 K/uL   Immature Granulocytes 0 %   Abs Immature Granulocytes 0.05 0.00 - 0.07 K/uL  Lactic acid, plasma  Result Value Ref Range   Lactic Acid, Venous 1.4 0.5 - 1.9 mmol/L  Lactic acid, plasma  Result Value Ref Range   Lactic Acid, Venous 1.8 0.5 - 1.9 mmol/L  Comprehensive metabolic panel  Result Value Ref Range   Sodium 137 135 - 145 mmol/L   Potassium 4.2 3.5 - 5.1 mmol/L   Chloride 103 98 - 111 mmol/L   CO2 27 22 - 32 mmol/L   Glucose, Bld 126 (H) 70 - 99 mg/dL   BUN 18 8 - 23 mg/dL   Creatinine, Ser 3.41 0.44 - 1.00 mg/dL   Calcium 8.9 8.9 - 96.2 mg/dL   Total Protein 6.0 (L) 6.5 - 8.1 g/dL   Albumin 3.6 3.5 - 5.0 g/dL   AST 19 15 - 41 U/L   ALT 13 0 - 44 U/L   Alkaline Phosphatase 62 38 - 126 U/L   Total Bilirubin 0.4 0.3 - 1.2 mg/dL   GFR,  Estimated >22 >97 mL/min   Anion gap 7 5 - 15  Magnesium  Result Value Ref Range   Magnesium 1.5 (L) 1.7 - 2.4 mg/dL  CBC WITH DIFFERENTIAL  Result Value Ref Range   WBC 7.6 4.0 - 10.5 K/uL   RBC 3.73 (L) 3.87 - 5.11 MIL/uL   Hemoglobin 12.1 12.0 - 15.0 g/dL   HCT 98.9 21.1 - 94.1 %   MCV 97.1 80.0 - 100.0 fL   MCH 32.4 26.0 - 34.0 pg   MCHC 33.4 30.0 - 36.0 g/dL   RDW 74.0 81.4 - 48.1 %   Platelets 166 150 - 400 K/uL   nRBC 0.0 0.0 - 0.2 %   Neutrophils Relative % 83 %   Neutro Abs 6.3 1.7 - 7.7 K/uL   Lymphocytes Relative 10 %   Lymphs Abs 0.8 0.7 - 4.0 K/uL   Monocytes Relative 6 %   Monocytes Absolute 0.4 0.1 - 1.0 K/uL   Eosinophils Relative 1 %   Eosinophils Absolute 0.0 0.0 - 0.5 K/uL   Basophils Relative 0 %   Basophils Absolute 0.0 0.0 - 0.1 K/uL   Immature Granulocytes 0 %   Abs Immature Granulocytes 0.02 0.00 - 0.07 K/uL  Protime-INR  Result Value Ref Range   Prothrombin Time 12.6 11.4 - 15.2 seconds   INR 0.9 0.8 - 1.2  TSH  Result Value Ref Range   TSH 1.201 0.350 - 4.500 uIU/mL  Lipid panel  Result Value Ref Range   Cholesterol 201 (H) 0 - 200 mg/dL   Triglycerides 856 (H) <150 mg/dL   HDL 78 >31 mg/dL   Total CHOL/HDL Ratio 2.6 RATIO   VLDL 41 (H) 0 - 40 mg/dL   LDL Cholesterol 82 0 - 99 mg/dL  ECHOCARDIOGRAM COMPLETE  Result Value Ref Range   Weight 1,865.97 oz   Height 63 in   BP 132/75 mmHg   Single Plane A2C EF 56.8 %   Single Plane A4C EF 51.3 %   Calc EF  53.5 %   AR max vel 2.13 cm2   AV Area VTI 2.44 cm2   AV Mean grad 4.0 mmHg   AV Peak grad 7.4 mmHg   Ao pk vel 1.36 m/s   AV Area mean vel 2.02 cm2   MV VTI 4.18 cm2   Area-P 1/2 3.03 cm2   S' Lateral 3.00 cm     Maryln Manuel, MD [facility]   07/17/2021 10:11 PM How to contact the [facility] Attending or Consulting provider 7A - 7P or covering provider during after hours 7P -7A, for this patient?  Check the care team in [facility] and look for a) attending/consulting [facility] provider  listed and b) the [facility] team listed Log into [email or web address] and use [facility]'s universal password to access. If you do not have the password, please contact the hospital operator. Locate the [facility] provider you are looking for under [facility] and page to a number that you can be directly reached. If you still have difficulty reaching the provider, please page the [facility] (Director on Call) for the Hospitalists listed on amion for assistance.

## 2021-07-18 NOTE — ED Notes (Signed)
Cardizem paused per hospitalist

## 2021-07-18 NOTE — H&P (Signed)
TRH H&P    Patient Demographics:    Brandi Rowe, is a 65 y.o. female  MRN: [id number]  DOB - 22-Mar-1956  Admit Date - 07/17/2021  Referring MD/NP/PA: Rubin Payor  Outpatient Primary MD for the patient is Valentino Nose, NP  Patient coming from: Home  Chief complaint- abdominal pain   HPI:    Brandi Rowe  is a 65 y.o. female, with history of acute febrile illness, dehydration, gout, nausea, recent partial colectomy with colostomy, presents to ED with a chief complaint of abdominal pain.  Patient reports she had sudden onset of abdominal pain vomiting.  The pain started first around 4:30 PM on day of presentation.  Is progressively worse since it started.  It would alternate between being sharp and crampy.  The pain was waxing and waning.  It was in her lower abdomen all the way across.  Vomiting started 2 hours after the pain started.  She reports vomiting several times.  It was a dark red color.  Her last normal meal was solid at lunchtime.  She reports normal output in her colostomy bag.  She had no fever.  She has no other complaints at this time.  Patient is a current smoker, declines nicotine patch.  She drinks wine every other day but has gone 7-8 weeks in the past without drinking wine without any withdrawal symptoms.  She does not use illicit drugs.  Patient is vaccinated for COVID.  Patient is DNR.  In the ED Patient is afebrile, heart rate 1 37-25, respiratory rate 20-21, blood pressure 124/83, satting at 100% Cytosis 15.6, hemoglobin 15.8, platelets 252 Hypokalemic 3.2 Negative respiratory panel CT abdomen and pelvis shows an SBO with transition zone seen within the right lower quadrant and anastomosed bowel postoperative changes consistent with partial colectomy and subsequent left lower quadrant ostomy site KUB shows no acute abnormality in chest or abdomen EKG shows new onset A. fib with a  heart rate of 185 QTC 385 Patient's heart rate did not correct until NG tube was in place Patient was started on diltiazem, given Dilaudid, 1 L LR bolus, Lopressor 2.5 mg IV, Zofran, Zosyn, Compazine, NG tube Admission requested for small bowel obstruction, general surgery to see in the a.m.    Review of systems:    In addition to the HPI above,  No Fever-chills, No Headache, No changes with Vision or hearing, No problems swallowing food or Liquids, No Chest pain, Cough or Shortness of Breath, No Blood in stool or Urine, No dysuria, No new skin rashes or bruises, No new joints pains-aches,  No new weakness, tingling, numbness in any extremity, No recent weight gain or loss, No polyuria, polydypsia or polyphagia, No significant Mental Stressors.  All other systems reviewed and are negative.    Past History of the following :    Past Medical History:  Diagnosis Date   Acute febrile illness 01/04/2021   Anxiety    Dehydration 01/04/2021   Elevated MCV 01/04/2021   Gout    Nausea 01/04/2021   Sepsis (HCC) 01/05/2021  Past Surgical History:  Procedure Laterality Date   BLADDER REPAIR N/A 05/17/2021   Procedure: Closure of Cystotomy;  Surgeon: Malen Gauze, MD;  Location: [facility];  Service: Urology;  Laterality: N/A;   CARPAL TUNNEL RELEASE Right 2002   MANDIBLE SURGERY  1985   PARTIAL COLECTOMY N/A 05/17/2021   Procedure: PARTIAL COLECTOMY WITH COLOSTOMY;  Surgeon: Franky Macho, MD;  Location: [facility];  Service: General;  Laterality: N/A;   PELVIC LYMPH NODE DISSECTION N/A 05/17/2021   Procedure: PELVIC DISSECTION;  Surgeon: Lazaro Arms, MD;  Location: [facility];  Service: Gynecology;  Laterality: N/A;      Social History:      Social History   Tobacco Use   Smoking status: Every Day    Packs/day: 0.50    Years: 46.00    Pack years: 23.00    Types: Cigarettes   Smokeless tobacco: Never  Substance Use Topics   Alcohol use: Yes    Alcohol/week: 7.0  standard drinks    Types: 7 Glasses of wine per week       Family History :     Family History  Problem Relation Age of Onset   Congestive Heart Failure Mother    Uterine cancer Mother    Pancreatic cancer Father    Atrial fibrillation Brother    Stroke Maternal Grandmother    Heart disease Maternal Grandmother    Glaucoma Maternal Grandfather       Home Medications:   Prior to Admission medications   Medication Sig Start Date End Date Taking? Authorizing Provider  acetaminophen (TYLENOL) 500 MG tablet Take 500 mg by mouth 2 (two) times daily as needed for moderate pain or headache.    [provider]  aspirin EC 81 MG tablet Take 81 mg by mouth daily.    [provider]  calcium carbonate (TUMS - DOSED IN MG ELEMENTAL CALCIUM) 500 MG chewable tablet Chew 1 tablet by mouth daily as needed for indigestion or heartburn.    [provider]  clonazePAM (KLONOPIN) 1 MG tablet Take 1 tablet (1 mg total) by mouth daily as needed for anxiety. 06/08/21   Valentino Nose, NP  diphenhydrAMINE (BENADRYL) 25 MG tablet Take 25 mg by mouth at bedtime as needed for allergies.    [provider]  ferrous sulfate 325 (65 FE) MG EC tablet Take 1 tablet (325 mg total) by mouth 2 (two) times daily. 05/20/21 05/20/22  Franky Macho, MD  folic acid (FOLVITE) 400 MCG tablet Take 400 mcg by mouth daily.    [provider]  Polyvinyl Alcohol-Povidone (REFRESH OP) Place 1 drop into both eyes daily as needed (dry eyes).    [provider]  vitamin B-12 (CYANOCOBALAMIN) 1000 MCG tablet Take 1 tablet (1,000 mcg total) by mouth daily. 01/05/21 01/05/22  Lurene Shadow, MD     Allergies:     Allergies  Allergen Reactions   Grass Extracts [Gramineae Pollens]     Dust, mold, mites     Physical Exam:   Vitals  Blood pressure 118/65, pulse 84, temperature (!) 97.3 F (36.3 C), temperature source Axillary, resp. rate 12, height 5\' 3"  (1.6 m), weight  52.9 kg, SpO2 93 %.   1.  General: Patient lying supine in bed,  no acute distress   2. Psychiatric: Alert and oriented x 3, mood and behavior normal for situation, pleasant and cooperative with exam   3. Neurologic: Speech and language are normal, face is symmetric, moves  all 4 extremities voluntarily, at baseline without acute deficits on limited exam   4. HEENMT:  Head is atraumatic, normocephalic, pupils reactive to light, neck is supple, trachea is midline, mucous membranes are moist   5. Respiratory : Lungs are clear to auscultation bilaterally without wheezing, rhonchi, rales, no cyanosis, no increase in work of breathing or accessory muscle use   6. Cardiovascular : Heart rate normal, rhythm is regular, no murmurs, rubs or gallops, no peripheral edema, peripheral pulses palpated   7. Gastrointestinal:  Abdomen is soft, nondistended, left lower quadrant ostomy bag, no tenderness to palpation, bowel sounds active, no masses or organomegaly palpated   8. Skin:  Skin is warm, dry and intact without rashes, acute lesions, or ulcers on limited exam   9.Musculoskeletal:  No acute deformities or trauma, no asymmetry in tone, no peripheral edema, peripheral pulses palpated, no tenderness to palpation in the extremities     Data Review:    CBC Recent Labs  Lab 07/17/21 2240 07/18/21 0432  WBC 15.6* 7.6  HGB 15.8* 12.1  HCT 45.6 36.2  PLT 252 166  MCV 97.0 97.1  MCH 33.6 32.4  MCHC 34.6 33.4  RDW 13.8 13.9  LYMPHSABS 2.2 0.8  MONOABS 0.6 0.4  EOSABS 0.2 0.0  BASOSABS 0.1 0.0   ------------------------------------------------------------------------------------------------------------------  Results for orders placed or performed during the hospital encounter of 07/17/21 (from the past 48 hour(s))  Comprehensive metabolic panel     Status: Abnormal   Collection Time: 07/17/21 10:40 PM  Result Value Ref Range   Sodium 137 135 - 145 mmol/L   Potassium 3.2 (L) 3.5  - 5.1 mmol/L   Chloride 97 (L) 98 - 111 mmol/L   CO2 22 22 - 32 mmol/L   Glucose, Bld 167 (H) 70 - 99 mg/dL    Comment: Glucose reference range applies only to samples taken after fasting for at least 8 hours.   BUN 18 8 - 23 mg/dL   Creatinine, Ser 8.11 0.44 - 1.00 mg/dL   Calcium 91.4 (H) 8.9 - 10.3 mg/dL   Total Protein 8.4 (H) 6.5 - 8.1 g/dL   Albumin 5.1 (H) 3.5 - 5.0 g/dL   AST 20 15 - 41 U/L   ALT 14 0 - 44 U/L   Alkaline Phosphatase 89 38 - 126 U/L   Total Bilirubin 0.5 0.3 - 1.2 mg/dL   GFR, Estimated >78 >29 mL/min    Comment: (NOTE) Calculated using the CKD-EPI Creatinine Equation (2021)    Anion gap 18 (H) 5 - 15    Comment: Performed at [facility], [address], [place], [place] [address]  Lipase, blood     Status: None   Collection Time: 07/17/21 10:40 PM  Result Value Ref Range   Lipase 33 11 - 51 U/L    Comment: Performed at [facility], [address] [place], [place] [address]  CBC with Differential     Status: Abnormal   Collection Time: 07/17/21 10:40 PM  Result Value Ref Range   WBC 15.6 (H) 4.0 - 10.5 K/uL   RBC 4.70 3.87 - 5.11 MIL/uL   Hemoglobin 15.8 (H) 12.0 - 15.0 g/dL   HCT 84.6 96.2 - 95.2 %   MCV 97.0 80.0 - 100.0 fL   MCH 33.6 26.0 - 34.0 pg   MCHC 34.6 30.0 - 36.0 g/dL   RDW 84.1 32.4 - 40.1 %   Platelets 252 150 - 400 K/uL   nRBC 0.0 0.0 - 0.2 %  Neutrophils Relative % 80 %   Neutro Abs 12.4 (H) 1.7 - 7.7 K/uL   Lymphocytes Relative 14 %   Lymphs Abs 2.2 0.7 - 4.0 K/uL   Monocytes Relative 4 %   Monocytes Absolute 0.6 0.1 - 1.0 K/uL   Eosinophils Relative 1 %   Eosinophils Absolute 0.2 0.0 - 0.5 K/uL   Basophils Relative 1 %   Basophils Absolute 0.1 0.0 - 0.1 K/uL   Immature Granulocytes 0 %   Abs Immature Granulocytes 0.05 0.00 - 0.07 K/uL    Comment: Performed at [facility], [address] [place], [place] [address]  Resp Panel by RT-PCR (Flu A&B, Covid) Nasopharyngeal Swab     Status: None   Collection Time: 07/17/21 10:46  PM   Specimen: Nasopharyngeal Swab; Nasopharyngeal(NP) swabs in vial transport medium  Result Value Ref Range   SARS Coronavirus 2 by RT PCR NEGATIVE NEGATIVE    Comment: (NOTE) SARS-CoV-2 target nucleic acids are NOT DETECTED.  The SARS-CoV-2 RNA is generally detectable in upper respiratory specimens during the acute phase of infection. The lowest concentration of SARS-CoV-2 viral copies this assay can detect is 138 copies/mL. A negative result does not preclude SARS-Cov-2 infection and should not be used as the sole basis for treatment or other patient management decisions. A negative result may occur with  improper specimen collection/handling, submission of specimen other than nasopharyngeal swab, presence of viral mutation(s) within the areas targeted by this assay, and inadequate number of viral copies(<138 copies/mL). A negative result must be combined with clinical observations, patient history, and epidemiological information. The expected result is Negative.  Fact Sheet for Patients:  BloggerCourse.com  Fact Sheet for Healthcare Providers:  SeriousBroker.it  This test is no t yet approved or cleared by the [place] FDA and  has been authorized for detection and/or diagnosis of SARS-CoV-2 by FDA under an Emergency Use Authorization (EUA). This EUA will remain  in effect (meaning this test can be used) for the duration of the COVID-19 declaration under Section 564(b)(1) of the Act, 21 U.S.C.section 360bbb-3(b)(1), unless the authorization is terminated  or revoked sooner.       Influenza A by PCR NEGATIVE NEGATIVE   Influenza B by PCR NEGATIVE NEGATIVE    Comment: (NOTE) The Xpert Xpress SARS-CoV-2/FLU/RSV plus assay is intended as an aid in the diagnosis of influenza from Nasopharyngeal swab specimens and should not be used as a sole basis for treatment. Nasal washings and aspirates are unacceptable for Xpert  Xpress SARS-CoV-2/FLU/RSV testing.  Fact Sheet for Patients: BloggerCourse.com  Fact Sheet for Healthcare Providers: SeriousBroker.it  This test is not yet approved or cleared by the [place] FDA and has been authorized for detection and/or diagnosis of SARS-CoV-2 by FDA under an Emergency Use Authorization (EUA). This EUA will remain in effect (meaning this test can be used) for the duration of the COVID-19 declaration under Section 564(b)(1) of the Act, 21 U.S.C. section 360bbb-3(b)(1), unless the authorization is terminated or revoked.  Performed at [facility], [address] [place], [place] [address]   Culture, blood (routine x 2)     Status: None (Preliminary result)   Collection Time: 07/18/21 12:14 AM   Specimen: Left Antecubital; Blood  Result Value Ref Range   Specimen Description LEFT ANTECUBITAL    Special Requests      BOTTLES DRAWN AEROBIC AND ANAEROBIC Blood Culture results may not be optimal due to an excessive volume of blood received in culture bottles Performed at [facility], [address], [place], [place] [address]    Culture PENDING    Report Status PENDING   Culture, blood (routine x 2)     Status: None (Preliminary result)   Collection Time: 07/18/21 12:14 AM   Specimen: Left Antecubital; Blood  Result Value Ref Range   Specimen Description BLOOD LEFT HAND    Special Requests      BOTTLES DRAWN AEROBIC AND ANAEROBIC Blood Culture results may not be optimal due to an excessive volume of blood received in culture bottles Performed at [facility], [address] [place], [place] [address]    Culture PENDING    Report Status PENDING   Lactic acid, plasma     Status: None   Collection Time: 07/18/21 12:14 AM  Result Value Ref Range   Lactic Acid, Venous 1.4 0.5 - 1.9 mmol/L    Comment: Performed at [facility], [address] [place], [place] [address]  Lactic acid, plasma     Status: None    Collection Time: 07/18/21  2:15 AM  Result Value Ref Range   Lactic Acid, Venous 1.8 0.5 - 1.9 mmol/L    Comment: Performed at [facility], [address] [place], [place] [address]  Comprehensive metabolic panel     Status: Abnormal   Collection Time: 07/18/21  4:32 AM  Result Value Ref Range   Sodium 137 135 - 145 mmol/L   Potassium 4.2 3.5 - 5.1 mmol/L    Comment: DELTA CHECK NOTED   Chloride 103 98 - 111 mmol/L   CO2 27 22 - 32 mmol/L   Glucose, Bld 126 (H) 70 - 99 mg/dL    Comment: Glucose reference range applies only to samples taken after fasting for at least 8 hours.   BUN 18 8 - 23 mg/dL   Creatinine, Ser 4.43 0.44 - 1.00 mg/dL   Calcium 8.9 8.9 - 15.4 mg/dL   Total Protein 6.0 (L) 6.5 - 8.1 g/dL   Albumin 3.6 3.5 - 5.0 g/dL   AST 19 15 - 41 U/L   ALT 13 0 - 44 U/L   Alkaline Phosphatase 62 38 - 126 U/L   Total Bilirubin 0.4 0.3 - 1.2 mg/dL   GFR, Estimated >00 >86 mL/min    Comment: (NOTE) Calculated using the CKD-EPI Creatinine Equation (2021)    Anion gap 7 5 - 15    Comment: Performed at [facility], [address] [place], [place] [address]  Magnesium     Status: Abnormal   Collection Time: 07/18/21  4:32 AM  Result Value Ref Range   Magnesium 1.5 (L) 1.7 - 2.4 mg/dL    Comment: Performed at [facility], [address], [place], [place] [address]  CBC WITH DIFFERENTIAL     Status: Abnormal   Collection Time: 07/18/21  4:32 AM  Result Value Ref Range   WBC 7.6 4.0 - 10.5 K/uL   RBC 3.73 (L) 3.87 - 5.11 MIL/uL   Hemoglobin 12.1 12.0 - 15.0 g/dL   HCT 71.2 45.8 - 09.9 %   MCV 97.1 80.0 - 100.0 fL   MCH 32.4 26.0 - 34.0 pg   MCHC 33.4 30.0 - 36.0 g/dL   RDW 83.3 82.5 - 05.3 %   Platelets 166 150 - 400 K/uL   nRBC 0.0 0.0 - 0.2 %   Neutrophils Relative % 83 %   Neutro Abs 6.3 1.7 - 7.7 K/uL   Lymphocytes Relative 10 %   Lymphs Abs 0.8 0.7 -  4.0 K/uL   Monocytes Relative 6 %   Monocytes Absolute 0.4 0.1 - 1.0 K/uL   Eosinophils Relative 1 %    Eosinophils Absolute 0.0 0.0 - 0.5 K/uL   Basophils Relative 0 %   Basophils Absolute 0.0 0.0 - 0.1 K/uL   Immature Granulocytes 0 %   Abs Immature Granulocytes 0.02 0.00 - 0.07 K/uL    Comment: Performed at [facility], [address] [place], [place] [address]  Protime-INR     Status: None   Collection Time: 07/18/21  4:32 AM  Result Value Ref Range   Prothrombin Time 12.6 11.4 - 15.2 seconds   INR 0.9 0.8 - 1.2    Comment: (NOTE) INR goal varies based on device and disease states. Performed at [facility], [address] [place], [place] [address]     Chemistries  Recent Labs  Lab 07/17/21 2240 07/18/21 0432  NA 137 137  K 3.2* 4.2  CL 97* 103  CO2 22 27  GLUCOSE 167* 126*  BUN 18 18  CREATININE 0.79 0.71  CALCIUM 10.7* 8.9  MG  --  1.5*  AST 20 19  ALT 14 13  ALKPHOS 89 62  BILITOT 0.5 0.4   ------------------------------------------------------------------------------------------------------------------  ------------------------------------------------------------------------------------------------------------------ GFR: Estimated Creatinine Clearance: 57.2 mL/min (by C-G formula based on SCr of 0.71 mg/dL). Liver Function Tests: Recent Labs  Lab 07/17/21 2240 07/18/21 0432  AST 20 19  ALT 14 13  ALKPHOS 89 62  BILITOT 0.5 0.4  PROT 8.4* 6.0*  ALBUMIN 5.1* 3.6   Recent Labs  Lab 07/17/21 2240  LIPASE 33   No results for input(s): AMMONIA in the last 168 hours. Coagulation Profile: Recent Labs  Lab 07/18/21 0432  INR 0.9   Cardiac Enzymes: No results for input(s): CKTOTAL, CKMB, CKMBINDEX, TROPONINI in the last 168 hours. BNP (last 3 results) No results for input(s): PROBNP in the last 8760 hours. HbA1C: No results for input(s): HGBA1C in the last 72 hours. CBG: No results for input(s): GLUCAP in the last 168 hours. Lipid Profile: No results for input(s): CHOL, HDL, LDLCALC, TRIG, CHOLHDL, LDLDIRECT in the last 72 hours. Thyroid  Function Tests: No results for input(s): TSH, T4TOTAL, FREET4, T3FREE, THYROIDAB in the last 72 hours. Anemia Panel: No results for input(s): VITAMINB12, FOLATE, FERRITIN, TIBC, IRON, RETICCTPCT in the last 72 hours.  --------------------------------------------------------------------------------------------------------------- Urine analysis:    Component Value Date/Time   COLORURINE YELLOW 01/04/2021 1424   APPEARANCEUR Hazy (A) 03/01/2021 1415   LABSPEC 1.014 01/04/2021 1424   PHURINE 5.0 01/04/2021 1424   GLUCOSEU Negative 03/01/2021 1415   HGBUR LARGE (A) 01/04/2021 1424   BILIRUBINUR Negative 03/01/2021 1415   KETONESUR 20 (A) 01/04/2021 1424   PROTEINUR 2+ (A) 03/01/2021 1415   PROTEINUR 30 (A) 01/04/2021 1424   NITRITE Negative 03/01/2021 1415   NITRITE NEGATIVE 01/04/2021 1424   LEUKOCYTESUR 1+ (A) 03/01/2021 1415   LEUKOCYTESUR LARGE (A) 01/04/2021 1424      Imaging Results:    CT ABDOMEN PELVIS W CONTRAST  Result Date: 07/18/2021 CLINICAL DATA:  Abdominal pain with decreased output from existing colostomy back. EXAM: CT ABDOMEN AND PELVIS WITH CONTRAST TECHNIQUE: Multidetector CT imaging of the abdomen and pelvis was performed using the standard protocol following bolus administration of intravenous contrast. CONTRAST:  OMNIPAQUE IOHEXOL 300 MG/ML  SOLN COMPARISON:  January 27, 2021 FINDINGS: Lower chest: No acute abnormality. Hepatobiliary: No focal liver abnormality is seen. No gallstones, gallbladder wall thickening, or biliary dilatation. Pancreas: Unremarkable.  No pancreatic ductal dilatation or surrounding inflammatory changes. Spleen: Normal in size without focal abnormality. Adrenals/Urinary Tract: Adrenal glands are unremarkable. Kidneys are normal, without renal calculi, focal lesion, or hydronephrosis. The urinary bladder is partially collapsed and subsequently limited in evaluation. Stomach/Bowel: There is a small hiatal hernia. Appendix appears normal.  Surgically anastomosed bowel is seen within the right lower quadrant and within the expected region of the mid to distal sigmoid colon. Multiple dilated, fluid-filled loops of small bowel are seen throughout the abdomen and pelvis (maximum small bowel diameter of approximately 3.4 cm). A transition zone is seen within the right lower quadrant, at the area of previously noted surgically anastomosed small bowel (axial CT images 45 through 48, CT series 2). Vascular/Lymphatic: Aortic atherosclerosis. No enlarged abdominal or pelvic lymph nodes. Reproductive: Uterus and bilateral adnexa are unremarkable. Other: There is a left lower quadrant ostomy site. A very small amount of perihepatic fluid is seen. Musculoskeletal: No acute or significant osseous findings. IMPRESSION: 1. Small-bowel obstruction with a transition zone seen within the right lower quadrant, at a site of surgically anastomosed small bowel. 2. Postoperative changes consistent with partial colectomy and subsequent left lower quadrant ostomy site. 3. Small hiatal hernia. 4. Aortic atherosclerosis. Aortic Atherosclerosis (ICD10-I70.0). Electronically Signed   By: Aram Candela M.D.   On: 07/18/2021 00:01   DG Chest Port 1 View  Result Date: 07/18/2021 CLINICAL DATA:  Confirm nasogastric tube placement. EXAM: PORTABLE CHEST 1 VIEW COMPARISON:  January 04, 2021 FINDINGS: There are multiple overlying cardiac lead wires and mildly radiopaque tags. A nasogastric tube is seen with its distal end extending into the body of the stomach. The distal side hole sits at the approximate level of the gastroesophageal junction. Mild atelectasis is seen within the left lung base. There is no evidence of a pleural effusion or pneumothorax. The heart size and mediastinal contours are within normal limits. Moderate severity calcification of the aortic arch is noted. The visualized skeletal structures are unremarkable. IMPRESSION: 1. Nasogastric tube with distal end in  the body of the stomach. 2. Mild left basilar atelectasis. Electronically Signed   By: Aram Candela M.D.   On: 07/18/2021 01:31   DG Abdomen Acute W/Chest  Result Date: 07/17/2021 CLINICAL DATA:  Chest and abdominal pain, history of prior colectomy and decreased semi output EXAM: DG ABDOMEN ACUTE WITH 1 VIEW CHEST COMPARISON:  01/27/2021 CT, chest x-ray from 01/04/2021 FINDINGS: Cardiac shadow is stable. Aortic calcifications are noted. The lungs are hyperinflated but clear. Scattered large and small bowel gas is noted. Left-sided ostomy is noted new from the prior CT examination. No free air is seen. No obstructive changes are noted. No acute bony abnormality is seen. IMPRESSION: No acute abnormality in the chest and abdomen. Electronically Signed   By: Alcide Clever M.D.   On: 07/17/2021 23:06     Assessment & Plan:    Principal Problem:   SBO (small bowel obstruction) (HCC) Active Problems:   Tobacco use disorder   New onset atrial fibrillation (HCC)   Hypokalemia   SBO At site of anastomosis. Zosyn for possible intra-abdominal infection N.p.o. NG tube Monitor with a.m. KUB General surgery to see in the a.m. Pain control Continue to monitor New onset A. Fib Likely secondary to above Echo in the a.m. Currently holding dill drip given hypotension and that heart rate has corrected Holding anticoagulation given that patient reports hematemesis Monitor on telemetry Hypokalemia Replace and recheck Tobacco use disorder Patient declines nicotine patch at  this time Advised on the importance of cessation of    DVT Prophylaxis-    SCDs   AM Labs Ordered, also please review Full Orders  Family Communication: No family at bedside Code Status: DNR  Admission status: Inpatient :The appropriate admission status for this patient is INPATIENT. Inpatient status is judged to be reasonable and necessary in order to provide the required intensity of service to ensure the patient's  safety. The patient's presenting symptoms, physical exam findings, and initial radiographic and laboratory data in the context of their chronic comorbidities is felt to place them at high risk for further clinical deterioration. Furthermore, it is not anticipated that the patient will be medically stable for discharge from the hospital within 2 midnights of admission. The following factors support the admission status of inpatient.     The patient's presenting symptoms include abdominal pain and vomiting. The worrisome physical exam findings include atrial fibrillation. The initial radiographic and laboratory data are worrisome because of small bowel obstruction.        * I certify that at the point of admission it is my clinical judgment that the patient will require inpatient hospital care spanning beyond 2 midnights from the point of admission due to high intensity of service, high risk for further deterioration and high frequency of surveillance required.*  Disposition: Anticipated Discharge date 48-72 hours discharge to home  Time spent in minutes : 65   Ethelda Deangelo B Zierle-Ghosh DO

## 2021-07-18 NOTE — ED Notes (Signed)
Attempted report x1. 

## 2021-07-18 NOTE — ED Provider Notes (Signed)
[facility] EMERGENCY DEPARTMENT Provider Note   CSN: [id number] Arrival date & time: 07/17/21  2116     History Chief Complaint  Patient presents with   Abdominal Pain    Brandi Rowe is a 65 y.o. female.   Abdominal Pain Associated symptoms: chest pain, constipation, nausea and vomiting   Associated symptoms: no diarrhea and no shortness of breath   Patient presents abdominal pain.  Began earlier today.  Decreased colostomy output.  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  Also had fistulas.  Had been septic.  Has been vomiting.  Severe pain.  She is worried she has another infection.  Upon arrival found to have a heart rate of around 180.  Not having fevers.  Had a little blood in the emesis.  No history of atrial fibrillation.    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   Colovesical fistula    Diverticulitis of colon 01/04/2021   Acute diarrhea 01/04/2021   Abdominal pain 01/04/2021   Gout 05/09/2020   Polycythemia 09/25/2018   Current every day smoker 09/25/2018   Anxiety disorder, unspecified 09/24/2018    Past Surgical History:  Procedure Laterality Date   BLADDER REPAIR N/A 05/17/2021   Procedure: Closure of Cystotomy;  Surgeon: Malen Gauze, MD;  Location: [facility];  Service: Urology;  Laterality: N/A;   CARPAL TUNNEL RELEASE Right 2002   MANDIBLE SURGERY  1985   PARTIAL COLECTOMY N/A 05/17/2021   Procedure: PARTIAL COLECTOMY WITH COLOSTOMY;  Surgeon: Franky Macho, MD;  Location: [facility];  Service: General;  Laterality: N/A;    PELVIC LYMPH NODE DISSECTION N/A 05/17/2021   Procedure: PELVIC DISSECTION;  Surgeon: Lazaro Arms, MD;  Location: [facility];  Service: Gynecology;  Laterality: N/A;     OB History     Gravida  0   Para  0   Term  0   Preterm  0   AB  0   Living  0      SAB  0   IAB  0   Ectopic  0   Multiple  0   Live Births  0           Family History  Problem Relation Age of Onset   Congestive Heart Failure Mother    Uterine cancer Mother    Pancreatic cancer Father    Atrial fibrillation Brother    Stroke Maternal Grandmother    Heart disease Maternal Grandmother    Glaucoma Maternal Grandfather     Social History   Tobacco Use   Smoking status: Every Day    Packs/day: 0.50    Years: 46.00    Pack years: 23.00    Types: Cigarettes   Smokeless tobacco: Never  Vaping Use   Vaping Use: Never used  Substance Use Topics   Alcohol use: Yes    Alcohol/week: 7.0 standard drinks    Types: 7 Glasses of wine per week   Drug use: Never    Home Medications Prior to Admission medications   Medication Sig Start  Date End Date Taking? Authorizing Provider  acetaminophen (TYLENOL) 500 MG tablet Take 500 mg by mouth 2 (two) times daily as needed for moderate pain or headache.    [provider]  aspirin EC 81 MG tablet Take 81 mg by mouth daily.    [provider]  calcium carbonate (TUMS - DOSED IN MG ELEMENTAL CALCIUM) 500 MG chewable tablet Chew 1 tablet by mouth daily as needed for indigestion or heartburn.    [provider]  clonazePAM (KLONOPIN) 1 MG tablet Take 1 tablet (1 mg total) by mouth daily as needed for anxiety. 06/08/21   Valentino NoseMartinez, Jessica A, NP  diphenhydrAMINE (BENADRYL) 25 MG tablet Take 25 mg by mouth at bedtime as needed for allergies.    [provider]  ferrous sulfate 325 (65 FE) MG EC tablet Take 1 tablet (325 mg total) by mouth 2 (two) times daily. 05/20/21 05/20/22  Franky MachoJenkins, Mark, MD  folic acid (FOLVITE) 400  MCG tablet Take 400 mcg by mouth daily.    [provider]  Polyvinyl Alcohol-Povidone (REFRESH OP) Place 1 drop into both eyes daily as needed (dry eyes).    [provider]  vitamin B-12 (CYANOCOBALAMIN) 1000 MCG tablet Take 1 tablet (1,000 mcg total) by mouth daily. 01/05/21 01/05/22  Lurene ShadowAyiku, Bernard, MD    Allergies    Grass extracts [gramineae pollens]  Review of Systems   Review of Systems  Constitutional:  Positive for appetite change.  HENT:  Negative for congestion.   Respiratory:  Negative for shortness of breath.   Cardiovascular:  Positive for chest pain. Negative for palpitations.  Gastrointestinal:  Positive for abdominal pain, constipation, nausea and vomiting. Negative for diarrhea.  Genitourinary:  Negative for flank pain.  Musculoskeletal:  Negative for back pain.  Psychiatric/Behavioral:  Negative for confusion.    Physical Exam Updated Vital Signs BP 124/83    Pulse (!) 137    Temp (!) 97.5 F (36.4 C) (Oral)    Resp (!) 21    Ht 5\' 3"  (1.6 m)    Wt 50.8 kg    SpO2 100%    BMI 19.84 kg/m   Physical Exam Vitals and nursing note reviewed.  Constitutional:      Comments: Patient appears uncomfortable.  HENT:     Head: Normocephalic.  Cardiovascular:     Rate and Rhythm: Tachycardia present. Rhythm irregular.  Pulmonary:     Breath sounds: Normal breath sounds.  Abdominal:     Tenderness: There is abdominal tenderness.     Hernia: No hernia is present.     Comments: Moderate tenderness mid to right lower abdomen.  Ostomy left abdomen.  Some distention.  No definite hernia palpated.  Skin:    General: Skin is warm.     Capillary Refill: Capillary refill takes less than 2 seconds.  Neurological:     Mental Status: She is alert and oriented to person, place, and time.    ED Results / Procedures / Treatments   Labs (all labs ordered are listed, but only abnormal results are displayed) Labs Reviewed  COMPREHENSIVE METABOLIC PANEL - Abnormal;  Notable for the following components:      Result Value   Potassium 3.2 (*)    Chloride 97 (*)    Glucose, Bld 167 (*)    Calcium 10.7 (*)    Total Protein 8.4 (*)    Albumin 5.1 (*)    Anion gap 18 (*)    All other components within normal  limits  CBC WITH DIFFERENTIAL/PLATELET - Abnormal; Notable for the following components:   WBC 15.6 (*)    Hemoglobin 15.8 (*)    Neutro Abs 12.4 (*)    All other components within normal limits  RESP PANEL BY RT-PCR (FLU A&B, COVID) [id number]  CULTURE, BLOOD (ROUTINE X 2)  CULTURE, BLOOD (ROUTINE X 2)  LIPASE, BLOOD  LACTIC ACID, PLASMA  LACTIC ACID, PLASMA    EKG EKG Interpretation  Date/Time:  Monday July 17 2021 22:25:32 EST Ventricular Rate:  185 PR Interval:    QRS Duration: 78 QT Interval:  219 QTC Calculation: 385 R Axis:   66 Text Interpretation: Atrial fibrillation with rapid V-rate Repolarization abnormality, prob rate related Baseline wander in lead(s) V3 V4 Confirmed by Davonna Belling [id number]) on 07/18/2021 12:20:56 AM  Radiology CT ABDOMEN PELVIS W CONTRAST  Result Date: 07/18/2021 CLINICAL DATA:  Abdominal pain with decreased output from existing colostomy back. EXAM: CT ABDOMEN AND PELVIS WITH CONTRAST TECHNIQUE: Multidetector CT imaging of the abdomen and pelvis was performed using the standard protocol following bolus administration of intravenous contrast. CONTRAST:  182mL OMNIPAQUE IOHEXOL 300 MG/ML  SOLN COMPARISON:  January 27, 2021 FINDINGS: Lower chest: No acute abnormality. Hepatobiliary: No focal liver abnormality is seen. No gallstones, gallbladder wall thickening, or biliary dilatation. Pancreas: Unremarkable. No pancreatic ductal dilatation or surrounding inflammatory changes. Spleen: Normal in size without focal abnormality. Adrenals/Urinary Tract: Adrenal glands are unremarkable. Kidneys are normal, without renal calculi, focal lesion, or hydronephrosis. The urinary bladder is partially collapsed and  subsequently limited in evaluation. Stomach/Bowel: There is a small hiatal hernia. Appendix appears normal. Surgically anastomosed bowel is seen within the right lower quadrant and within the expected region of the mid to distal sigmoid colon. Multiple dilated, fluid-filled loops of small bowel are seen throughout the abdomen and pelvis (maximum small bowel diameter of approximately 3.4 cm). A transition zone is seen within the right lower quadrant, at the area of previously noted surgically anastomosed small bowel (axial CT images 45 through 48, CT series 2). Vascular/Lymphatic: Aortic atherosclerosis. No enlarged abdominal or pelvic lymph nodes. Reproductive: Uterus and bilateral adnexa are unremarkable. Other: There is a left lower quadrant ostomy site. A very small amount of perihepatic fluid is seen. Musculoskeletal: No acute or significant osseous findings. IMPRESSION: 1. Small-bowel obstruction with a transition zone seen within the right lower quadrant, at a site of surgically anastomosed small bowel. 2. Postoperative changes consistent with partial colectomy and subsequent left lower quadrant ostomy site. 3. Small hiatal hernia. 4. Aortic atherosclerosis. Aortic Atherosclerosis (ICD10-I70.0). Electronically Signed   By: Virgina Norfolk M.D.   On: 07/18/2021 00:01   DG Abdomen Acute W/Chest  Result Date: 07/17/2021 CLINICAL DATA:  Chest and abdominal pain, history of prior colectomy and decreased semi output EXAM: DG ABDOMEN ACUTE WITH 1 VIEW CHEST COMPARISON:  01/27/2021 CT, chest x-ray from 01/04/2021 FINDINGS: Cardiac shadow is stable. Aortic calcifications are noted. The lungs are hyperinflated but clear. Scattered large and small bowel gas is noted. Left-sided ostomy is noted new from the prior CT examination. No free air is seen. No obstructive changes are noted. No acute bony abnormality is seen. IMPRESSION: No acute abnormality in the chest and abdomen. Electronically Signed   By: Inez Catalina  M.D.   On: 07/17/2021 23:06    Procedures Procedures   Medications Ordered in ED Medications  lactated ringers bolus 1,000 mL (has no administration in time range)  piperacillin-tazobactam (ZOSYN) IVPB 3.375 g (3.375 g Intravenous  New Bag/Given 07/18/21 0012)  diltiazem (CARDIZEM) 1 mg/mL load via infusion 10 mg (10 mg Intravenous Bolus from Bag 07/18/21 0014)    And  diltiazem (CARDIZEM) 125 mg in dextrose 5% 125 mL (1 mg/mL) infusion (5 mg/hr Intravenous New Bag/Given 07/18/21 0015)  prochlorperazine (COMPAZINE) injection 5 mg (has no administration in time range)  metoprolol tartrate (LOPRESSOR) injection 2.5 mg ( Intravenous Canceled Entry 07/17/21 2243)  HYDROmorphone (DILAUDID) injection 0.5 mg (0.5 mg Intravenous Given 07/17/21 2249)  ondansetron (ZOFRAN) injection 4 mg (4 mg Intravenous Given 07/17/21 2249)  iohexol (OMNIPAQUE) 300 MG/ML solution 100 mL (100 mLs Intravenous Contrast Given 07/17/21 2335)  HYDROmorphone (DILAUDID) injection 1 mg (1 mg Intravenous Given 07/18/21 0008)    ED Course  I have reviewed the triage vital signs and the nursing notes.  Pertinent labs & imaging results that were available during my care of the patient were reviewed by me and considered in my medical decision making (see chart for details).    MDM Rules/Calculators/A&P     CHA2DS2-VASc Score: 2                    Patient presents abdominal pain and vomiting.  Began earlier today.  2 months ago had a partial colectomy.  Now appears to have a bowel obstruction at the site of one of the anastomosis.  Discussed with Dr. Lovell Sheehan who did the surgery.  He will see her in consult tomorrow.  Will give NG tube tonight for comfort.  If does not tolerate it can hold off and even do clear liquids. Also found to be in a tachycardia.  Upon arrival had a heart rate of 180.  With the extra fast heart rate was going to try adenosine to help delineate the rhythm more, however did convert back to a sinus rhythm  before the medicine was given then converted back into the narrow tachycardia.  I think likely in atrial fibrillation.  No history of same.  IV Cardizem started for rate control and fluid boluses given since there could be component of dehydration and pain as a cause.  Will admit to hospitalist  CRITICAL CARE Performed by: Benjiman Core Total critical care time: 30 minutes Critical care time was exclusive of separately billable procedures and treating other patients. Critical care was necessary to treat or prevent imminent or life-threatening deterioration. Critical care was time spent personally by me on the following activities: development of treatment plan with patient and/or surrogate as well as nursing, discussions with consultants, evaluation of patient's response to treatment, examination of patient, obtaining history from patient or surrogate, ordering and performing treatments and interventions, ordering and review of laboratory studies, ordering and review of radiographic studies, pulse oximetry and re-evaluation of patient's condition.     Final Clinical Impression(s) / ED Diagnoses Final diagnoses:  Partial intestinal obstruction, unspecified cause (HCC)  Atrial fibrillation with rapid ventricular response [facility])    Rx / DC Orders ED Discharge Orders     None        Benjiman Core, MD 07/18/21 [phone]

## 2021-07-18 NOTE — Consult Note (Signed)
Reason for Consult: Bowel obstruction Referring Physician: Dr. Vincente Liberty Brandi Rowe is an 65 y.o. female.  HPI: Patient is a 65 year old white female status post a partial colectomy with colostomy and small bowel resection on 05/17/2021 for treatment of a colovaginal/colovesical fistula who was doing well postoperatively until 07/16/2021 when she began experiencing increasing abdominal distention, discomfort, nausea, and vomiting.  She presented to the emergency room and a CT scan of the abdomen revealed a partial small bowel obstruction with multiple dilated loops of small bowel with a possible stenosis at the small bowel anastomosis.  Patient was admitted to the ICU due to uncontrolled rapid atrial fibrillation.  A small NG tube was placed.  The patient states that since she has been admitted, she has had to empty her colostomy bag twice.  She feels much better and denies any abdominal pain.  She complains of her NG tube.  Past Medical History:  Diagnosis Date   Acute febrile illness 01/04/2021   Anxiety    Colovaginal fistula    Colovesical fistula    a. 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   Dehydration 01/04/2021   Elevated MCV 01/04/2021   Gout    Nausea 01/04/2021   PAF (paroxysmal atrial fibrillation) ([place])    a. 06/2021 in setting of SBO, HypoK/Mg; b. 06/2021 Echo: EF 50-55%, no rwma, GrI DD, Nl RV size/fxn, RVSP 36.75mmHg. Mod dil RA. Triv MR.   Sepsis ([place]) 01/05/2021   Tobacco abuse     Past Surgical History:  Procedure Laterality Date   BLADDER REPAIR N/A 05/17/2021   Procedure: Closure of Cystotomy;  Surgeon: Cleon Gustin, MD;  Location: [facility];  Service: Urology;  Laterality: N/A;   CARPAL TUNNEL RELEASE Right 2002   MANDIBLE SURGERY  1985   PARTIAL COLECTOMY N/A 05/17/2021   Procedure: PARTIAL COLECTOMY WITH COLOSTOMY;  Surgeon: Aviva Signs, MD;  Location: [facility];  Service: General;  Laterality: N/A;   PELVIC LYMPH NODE DISSECTION N/A 05/17/2021    Procedure: PELVIC DISSECTION;  Surgeon: Florian Buff, MD;  Location: [facility];  Service: Gynecology;  Laterality: N/A;    Family History  Problem Relation Age of Onset   Congestive Heart Failure Mother    Uterine cancer Mother    Pancreatic cancer Father    Atrial fibrillation Brother    Stroke Maternal Grandmother    Heart disease Maternal Grandmother    Glaucoma Maternal Grandfather     Social History:  reports that she has been smoking cigarettes. She has a 23.00 pack-year smoking history. She has never used smokeless tobacco. She reports current alcohol use of about 7.0 standard drinks per week. She reports that she does not use drugs.  Allergies:  Allergies  Allergen Reactions   Grass Extracts [Gramineae Pollens]     Dust, mold, mites    Medications: I have reviewed the patient's current medications. Prior to Admission:  Medications Prior to Admission  Medication Sig Dispense Refill Last Dose   acetaminophen (TYLENOL) 500 MG tablet Take 500 mg by mouth 2 (two) times daily as needed for moderate pain or headache.      aspirin EC 81 MG tablet Take 81 mg by mouth daily.      calcium carbonate (TUMS - DOSED IN MG ELEMENTAL CALCIUM) 500 MG chewable tablet Chew 1 tablet by mouth daily as needed for indigestion or heartburn.      clonazePAM (KLONOPIN) 1 MG tablet Take 1 tablet (1 mg total) by mouth daily as needed  for anxiety. 20 tablet 0    diphenhydrAMINE (BENADRYL) 25 MG tablet Take 25 mg by mouth at bedtime as needed for allergies.      ferrous sulfate 325 (65 FE) MG EC tablet Take 1 tablet (325 mg total) by mouth 2 (two) times daily. 60 tablet 3    folic acid (FOLVITE) A999333 MCG tablet Take 400 mcg by mouth daily.      Polyvinyl Alcohol-Povidone (REFRESH OP) Place 1 drop into both eyes daily as needed (dry eyes).      vitamin B-12 (CYANOCOBALAMIN) 1000 MCG tablet Take 1 tablet (1,000 mcg total) by mouth daily. 30 tablet 0     Results for orders placed or performed during the  hospital encounter of 07/17/21 (from the past 48 hour(s))  Comprehensive metabolic panel     Status: Abnormal   Collection Time: 07/17/21 10:40 PM  Result Value Ref Range   Sodium 137 135 - 145 mmol/L   Potassium 3.2 (L) 3.5 - 5.1 mmol/L   Chloride 97 (L) 98 - 111 mmol/L   CO2 22 22 - 32 mmol/L   Glucose, Bld 167 (H) 70 - 99 mg/dL    Comment: Glucose reference range applies only to samples taken after fasting for at least 8 hours.   BUN 18 8 - 23 mg/dL   Creatinine, Ser 0.79 0.44 - 1.00 mg/dL   Calcium 10.7 (H) 8.9 - 10.3 mg/dL   Total Protein 8.4 (H) 6.5 - 8.1 g/dL   Albumin 5.1 (H) 3.5 - 5.0 g/dL   AST 20 15 - 41 U/L   ALT 14 0 - 44 U/L   Alkaline Phosphatase 89 38 - 126 U/L   Total Bilirubin 0.5 0.3 - 1.2 mg/dL   GFR, Estimated >60 >60 mL/min    Comment: (NOTE) Calculated using the CKD-EPI Creatinine Equation (2021)    Anion gap 18 (H) 5 - 15    Comment: Performed at [facility], [address], [place], [place] [address]  Lipase, blood     Status: None   Collection Time: 07/17/21 10:40 PM  Result Value Ref Range   Lipase 33 11 - 51 U/L    Comment: Performed at [facility], [address], [place], [place] [address]  CBC with Differential     Status: Abnormal   Collection Time: 07/17/21 10:40 PM  Result Value Ref Range   WBC 15.6 (H) 4.0 - 10.5 K/uL   RBC 4.70 3.87 - 5.11 MIL/uL   Hemoglobin 15.8 (H) 12.0 - 15.0 g/dL   HCT 45.6 36.0 - 46.0 %   MCV 97.0 80.0 - 100.0 fL   MCH 33.6 26.0 - 34.0 pg   MCHC 34.6 30.0 - 36.0 g/dL   RDW 13.8 11.5 - 15.5 %   Platelets 252 150 - 400 K/uL   nRBC 0.0 0.0 - 0.2 %   Neutrophils Relative % 80 %   Neutro Abs 12.4 (H) 1.7 - 7.7 K/uL   Lymphocytes Relative 14 %   Lymphs Abs 2.2 0.7 - 4.0 K/uL   Monocytes Relative 4 %   Monocytes Absolute 0.6 0.1 - 1.0 K/uL   Eosinophils Relative 1 %   Eosinophils Absolute 0.2 0.0 - 0.5 K/uL   Basophils Relative 1 %   Basophils Absolute 0.1 0.0 - 0.1 K/uL   Immature Granulocytes 0 %   Abs  Immature Granulocytes 0.05 0.00 - 0.07 K/uL    Comment: Performed at [facility], [address] [place], [place] [address]  Resp Panel  by RT-PCR (Flu A&B, Covid) Nasopharyngeal Swab     Status: None   Collection Time: 07/17/21 10:46 PM   Specimen: Nasopharyngeal Swab; Nasopharyngeal(NP) swabs in vial transport medium  Result Value Ref Range   SARS Coronavirus 2 by RT PCR NEGATIVE NEGATIVE    Comment: (NOTE) SARS-CoV-2 target nucleic acids are NOT DETECTED.  The SARS-CoV-2 RNA is generally detectable in upper respiratory specimens during the acute phase of infection. The lowest concentration of SARS-CoV-2 viral copies this assay can detect is 138 copies/mL. A negative result does not preclude SARS-Cov-2 infection and should not be used as the sole basis for treatment or other patient management decisions. A negative result may occur with  improper specimen collection/handling, submission of specimen other than nasopharyngeal swab, presence of viral mutation(s) within the areas targeted by this assay, and inadequate number of viral copies(<138 copies/mL). A negative result must be combined with clinical observations, patient history, and epidemiological information. The expected result is Negative.  Fact Sheet for Patients:  BloggerCourse.com  Fact Sheet for Healthcare Providers:  SeriousBroker.it  This test is no t yet approved or cleared by the [place] FDA and  has been authorized for detection and/or diagnosis of SARS-CoV-2 by FDA under an Emergency Use Authorization (EUA). This EUA will remain  in effect (meaning this test can be used) for the duration of the COVID-19 declaration under Section 564(b)(1) of the Act, 21 U.S.C.section 360bbb-3(b)(1), unless the authorization is terminated  or revoked sooner.       Influenza A by PCR NEGATIVE NEGATIVE   Influenza B by PCR NEGATIVE NEGATIVE    Comment: (NOTE) The Xpert  Xpress SARS-CoV-2/FLU/RSV plus assay is intended as an aid in the diagnosis of influenza from Nasopharyngeal swab specimens and should not be used as a sole basis for treatment. Nasal washings and aspirates are unacceptable for Xpert Xpress SARS-CoV-2/FLU/RSV testing.  Fact Sheet for Patients: BloggerCourse.com  Fact Sheet for Healthcare Providers: SeriousBroker.it  This test is not yet approved or cleared by the [place] FDA and has been authorized for detection and/or diagnosis of SARS-CoV-2 by FDA under an Emergency Use Authorization (EUA). This EUA will remain in effect (meaning this test can be used) for the duration of the COVID-19 declaration under Section 564(b)(1) of the Act, 21 U.S.C. section 360bbb-3(b)(1), unless the authorization is terminated or revoked.  Performed at [facility], [address] [place], [place] [address]   Culture, blood (routine x 2)     Status: None (Preliminary result)   Collection Time: 07/18/21 12:14 AM   Specimen: Left Antecubital; Blood  Result Value Ref Range   Specimen Description LEFT ANTECUBITAL    Special Requests      BOTTLES DRAWN AEROBIC AND ANAEROBIC Blood Culture results may not be optimal due to an excessive volume of blood received in culture bottles Performed at [facility], [address] [place], [place] [address]    Culture PENDING    Report Status PENDING   Culture, blood (routine x 2)     Status: None (Preliminary result)   Collection Time: 07/18/21 12:14 AM   Specimen: Left Antecubital; Blood  Result Value Ref Range   Specimen Description BLOOD LEFT HAND    Special Requests      BOTTLES DRAWN AEROBIC AND ANAEROBIC Blood Culture results may not be optimal due to an excessive volume of blood received in culture bottles Performed at [facility], [address] [place], [place] [address]    Culture PENDING  Report Status PENDING   Lactic acid, plasma     Status:  None   Collection Time: 07/18/21 12:14 AM  Result Value Ref Range   Lactic Acid, Venous 1.4 0.5 - 1.9 mmol/L    Comment: Performed at [facility], [address] [place], [place] [address]  Lactic acid, plasma     Status: None   Collection Time: 07/18/21  2:15 AM  Result Value Ref Range   Lactic Acid, Venous 1.8 0.5 - 1.9 mmol/L    Comment: Performed at [facility], [address], [place], [place] [address]  MRSA Next Gen by PCR, Nasal     Status: None   Collection Time: 07/18/21  4:29 AM   Specimen: Nasal Mucosa; Nasal Swab  Result Value Ref Range   MRSA by PCR Next Gen NOT DETECTED NOT DETECTED    Comment: (NOTE) The GeneXpert MRSA Assay (FDA approved for NASAL specimens only), is one component of a comprehensive MRSA colonization surveillance program. It is not intended to diagnose MRSA infection nor to guide or monitor treatment for MRSA infections. Test performance is not FDA approved in patients less than 59 years old. Performed at [facility], [address] [place], [place] [address]   Comprehensive metabolic panel     Status: Abnormal   Collection Time: 07/18/21  4:32 AM  Result Value Ref Range   Sodium 137 135 - 145 mmol/L   Potassium 4.2 3.5 - 5.1 mmol/L    Comment: DELTA CHECK NOTED   Chloride 103 98 - 111 mmol/L   CO2 27 22 - 32 mmol/L   Glucose, Bld 126 (H) 70 - 99 mg/dL    Comment: Glucose reference range applies only to samples taken after fasting for at least 8 hours.   BUN 18 8 - 23 mg/dL   Creatinine, Ser 0.71 0.44 - 1.00 mg/dL   Calcium 8.9 8.9 - 10.3 mg/dL   Total Protein 6.0 (L) 6.5 - 8.1 g/dL   Albumin 3.6 3.5 - 5.0 g/dL   AST 19 15 - 41 U/L   ALT 13 0 - 44 U/L   Alkaline Phosphatase 62 38 - 126 U/L   Total Bilirubin 0.4 0.3 - 1.2 mg/dL   GFR, Estimated >60 >60 mL/min    Comment: (NOTE) Calculated using the CKD-EPI Creatinine Equation (2021)    Anion gap 7 5 - 15    Comment: Performed at [facility], [address] [place], [place] [address]   Magnesium     Status: Abnormal   Collection Time: 07/18/21  4:32 AM  Result Value Ref Range   Magnesium 1.5 (L) 1.7 - 2.4 mg/dL    Comment: Performed at [facility], [address], [place], [place] [address]  CBC WITH DIFFERENTIAL     Status: Abnormal   Collection Time: 07/18/21  4:32 AM  Result Value Ref Range   WBC 7.6 4.0 - 10.5 K/uL   RBC 3.73 (L) 3.87 - 5.11 MIL/uL   Hemoglobin 12.1 12.0 - 15.0 g/dL   HCT 36.2 36.0 - 46.0 %   MCV 97.1 80.0 - 100.0 fL   MCH 32.4 26.0 - 34.0 pg   MCHC 33.4 30.0 - 36.0 g/dL   RDW 13.9 11.5 - 15.5 %   Platelets 166 150 - 400 K/uL   nRBC 0.0 0.0 - 0.2 %   Neutrophils Relative % 83 %   Neutro Abs 6.3 1.7 - 7.7 K/uL   Lymphocytes Relative 10 %   Lymphs Abs 0.8 0.7 -  4.0 K/uL   Monocytes Relative 6 %   Monocytes Absolute 0.4 0.1 - 1.0 K/uL   Eosinophils Relative 1 %   Eosinophils Absolute 0.0 0.0 - 0.5 K/uL   Basophils Relative 0 %   Basophils Absolute 0.0 0.0 - 0.1 K/uL   Immature Granulocytes 0 %   Abs Immature Granulocytes 0.02 0.00 - 0.07 K/uL    Comment: Performed at [facility], [address], [place], [place] [address]  Protime-INR     Status: None   Collection Time: 07/18/21  4:32 AM  Result Value Ref Range   Prothrombin Time 12.6 11.4 - 15.2 seconds   INR 0.9 0.8 - 1.2    Comment: (NOTE) INR goal varies based on device and disease states. Performed at [facility], [address] [place], [place] [address]   TSH     Status: None   Collection Time: 07/18/21  4:32 AM  Result Value Ref Range   TSH 1.201 0.350 - 4.500 uIU/mL    Comment: Performed by a 3rd Generation assay with a functional sensitivity of <=0.01 uIU/mL. Performed at [facility], [address], [place], [place] [address]     DG Abd 1 View  Result Date: 07/18/2021 CLINICAL DATA:  Abdominal pain EXAM: ABDOMEN - 1 VIEW COMPARISON:  CT abdomen and pelvis 07/17/2021 FINDINGS: A few abnormally distended loops of small bowel identified in the mid abdomen measuring up  to 3.5 cm in diameter. Left-sided ostomy. No suspicious calcifications identified. Enteric tube tip in the stomach. IMPRESSION: Abnormal bowel gas pattern consistent with the obstruction seen on CT. Continued follow-up recommended. Electronically Signed   By: Jannifer Hick M.D.   On: 07/18/2021 09:23   CT ABDOMEN PELVIS W CONTRAST  Result Date: 07/18/2021 CLINICAL DATA:  Abdominal pain with decreased output from existing colostomy back. EXAM: CT ABDOMEN AND PELVIS WITH CONTRAST TECHNIQUE: Multidetector CT imaging of the abdomen and pelvis was performed using the standard protocol following bolus administration of intravenous contrast. CONTRAST:  OMNIPAQUE IOHEXOL 300 MG/ML  SOLN COMPARISON:  January 27, 2021 FINDINGS: Lower chest: No acute abnormality. Hepatobiliary: No focal liver abnormality is seen. No gallstones, gallbladder wall thickening, or biliary dilatation. Pancreas: Unremarkable. No pancreatic ductal dilatation or surrounding inflammatory changes. Spleen: Normal in size without focal abnormality. Adrenals/Urinary Tract: Adrenal glands are unremarkable. Kidneys are normal, without renal calculi, focal lesion, or hydronephrosis. The urinary bladder is partially collapsed and subsequently limited in evaluation. Stomach/Bowel: There is a small hiatal hernia. Appendix appears normal. Surgically anastomosed bowel is seen within the right lower quadrant and within the expected region of the mid to distal sigmoid colon. Multiple dilated, fluid-filled loops of small bowel are seen throughout the abdomen and pelvis (maximum small bowel diameter of approximately 3.4 cm). A transition zone is seen within the right lower quadrant, at the area of previously noted surgically anastomosed small bowel (axial CT images 45 through 48, CT series 2). Vascular/Lymphatic: Aortic atherosclerosis. No enlarged abdominal or pelvic lymph nodes. Reproductive: Uterus and bilateral adnexa are unremarkable. Other: There is a  left lower quadrant ostomy site. A very small amount of perihepatic fluid is seen. Musculoskeletal: No acute or significant osseous findings. IMPRESSION: 1. Small-bowel obstruction with a transition zone seen within the right lower quadrant, at a site of surgically anastomosed small bowel. 2. Postoperative changes consistent with partial colectomy and subsequent left lower quadrant ostomy site. 3. Small hiatal hernia. 4. Aortic atherosclerosis. Aortic Atherosclerosis (ICD10-I70.0). Electronically Signed   By: Aram Candela  M.D.   On: 07/18/2021 00:01   DG Chest Port 1 View  Result Date: 07/18/2021 CLINICAL DATA:  Confirm nasogastric tube placement. EXAM: PORTABLE CHEST 1 VIEW COMPARISON:  January 04, 2021 FINDINGS: There are multiple overlying cardiac lead wires and mildly radiopaque tags. A nasogastric tube is seen with its distal end extending into the body of the stomach. The distal side hole sits at the approximate level of the gastroesophageal junction. Mild atelectasis is seen within the left lung base. There is no evidence of a pleural effusion or pneumothorax. The heart size and mediastinal contours are within normal limits. Moderate severity calcification of the aortic arch is noted. The visualized skeletal structures are unremarkable. IMPRESSION: 1. Nasogastric tube with distal end in the body of the stomach. 2. Mild left basilar atelectasis. Electronically Signed   By: Virgina Norfolk M.D.   On: 07/18/2021 01:31   DG Abdomen Acute W/Chest  Result Date: 07/17/2021 CLINICAL DATA:  Chest and abdominal pain, history of prior colectomy and decreased semi output EXAM: DG ABDOMEN ACUTE WITH 1 VIEW CHEST COMPARISON:  01/27/2021 CT, chest x-ray from 01/04/2021 FINDINGS: Cardiac shadow is stable. Aortic calcifications are noted. The lungs are hyperinflated but clear. Scattered large and small bowel gas is noted. Left-sided ostomy is noted new from the prior CT examination. No free air is seen. No  obstructive changes are noted. No acute bony abnormality is seen. IMPRESSION: No acute abnormality in the chest and abdomen. Electronically Signed   By: Inez Catalina M.D.   On: 07/17/2021 23:06   ECHOCARDIOGRAM COMPLETE  Result Date: 07/18/2021    ECHOCARDIOGRAM REPORT   Patient Name:   Brandi Rowe Date of Exam: 07/18/2021 Medical Rec #:  [id number]       Height:       63.0 in Accession #:    [id number]      Weight:       116.6 lb Date of Birth:  Sep 20, 1955       BSA:          1.538 m Patient Age:    32 years        BP:           132/75 mmHg Patient Gender: F               HR:           85 bpm. Exam Location:  [facility] Procedure: 2D Echo, Cardiac Doppler and Color Doppler Indications:    Atrial Fibrillation  History:        Patient has no prior history of Echocardiogram examinations.                 Arrythmias:Atrial Fibrillation.  Sonographer:    Wenda Low Referring Phys: [id number] ASIA B [place]  1. Left ventricular ejection fraction, by estimation, is 50 to 55%. Left ventricular ejection fraction by 2D MOD biplane is 53.5 %. The left ventricle has low normal function. The left ventricle has no regional wall motion abnormalities. Left ventricular diastolic parameters are consistent with Grade I diastolic dysfunction (impaired relaxation).  2. Right ventricular systolic function is normal. The right ventricular size is normal. There is mildly elevated pulmonary artery systolic pressure. The estimated right ventricular systolic pressure is 123XX123 mmHg.  3. Right atrial size was moderately dilated.  4. The mitral valve is grossly normal. Trivial mitral valve regurgitation.  5. The aortic valve is tricuspid. Aortic valve regurgitation is not visualized.  6. The inferior vena  cava is normal in size with <50% respiratory variability, suggesting right atrial pressure of 8 mmHg.  7. Rhythm strip during this exam demostrated normal sinus rhythm. Comparison(s): No prior Echocardiogram. FINDINGS   Left Ventricle: Left ventricular ejection fraction, by estimation, is 50 to 55%. Left ventricular ejection fraction by 2D MOD biplane is 53.5 %. The left ventricle has low normal function. The left ventricle has no regional wall motion abnormalities. The left ventricular internal cavity size was normal in size. There is no left ventricular hypertrophy. Left ventricular diastolic parameters are consistent with Grade I diastolic dysfunction (impaired relaxation). Indeterminate filling pressures. Right Ventricle: The right ventricular size is normal. No increase in right ventricular wall thickness. Right ventricular systolic function is normal. There is mildly elevated pulmonary artery systolic pressure. The tricuspid regurgitant velocity is 2.68  m/s, and with an assumed right atrial pressure of 8 mmHg, the estimated right ventricular systolic pressure is 123XX123 mmHg. Left Atrium: Left atrial size was normal in size. Right Atrium: Right atrial size was moderately dilated. Pericardium: There is no evidence of pericardial effusion. Mitral Valve: The mitral valve is grossly normal. Trivial mitral valve regurgitation. MV peak gradient, 2.1 mmHg. The mean mitral valve gradient is 1.0 mmHg. Tricuspid Valve: The tricuspid valve is grossly normal. Tricuspid valve regurgitation is mild. Aortic Valve: The aortic valve is tricuspid. Aortic valve regurgitation is not visualized. Aortic valve mean gradient measures 4.0 mmHg. Aortic valve peak gradient measures 7.4 mmHg. Aortic valve area, by VTI measures 2.44 cm. Pulmonic Valve: The pulmonic valve was grossly normal. Pulmonic valve regurgitation is trivial. Aorta: The aortic root and ascending aorta are structurally normal, with no evidence of dilitation. Venous: The inferior vena cava is normal in size with less than 50% respiratory variability, suggesting right atrial pressure of 8 mmHg. IAS/Shunts: No atrial level shunt detected by color flow Doppler. EKG: Rhythm strip during  this exam demostrated normal sinus rhythm.  LEFT VENTRICLE PLAX 2D                        Biplane EF (MOD) LVIDd:         4.10 cm         LV Biplane EF:   Left LVIDs:         3.00 cm                          ventricular LV PW:         0.90 cm                          ejection LV IVS:        0.60 cm                          fraction by LVOT diam:     1.80 cm                          2D MOD LV SV:         64                               biplane is LV SV Index:   41  53.5 %. LVOT Area:     2.54 cm                                Diastology                                LV e' medial:    6.89 cm/s LV Volumes (MOD)               LV E/e' medial:  8.2 LV vol d, MOD    34.7 ml       LV e' lateral:   7.78 cm/s A2C:                           LV E/e' lateral: 7.3 LV vol d, MOD    35.3 ml A4C: LV vol s, MOD    15.0 ml A2C: LV vol s, MOD    17.2 ml A4C: LV SV MOD A2C:   19.7 ml LV SV MOD A4C:   35.3 ml LV SV MOD BP:    18.6 ml RIGHT VENTRICLE RV Basal diam:  3.30 cm RV Mid diam:    2.40 cm RV S prime:     11.30 cm/s TAPSE (M-mode): 2.3 cm LEFT ATRIUM             Index        RIGHT ATRIUM           Index LA diam:        3.40 cm 2.21 cm/m   RA Area:     20.30 cm LA Vol (A2C):   43.8 ml 28.49 ml/m  RA Volume:   68.60 ml  44.62 ml/m LA Vol (A4C):   39.2 ml 25.50 ml/m LA Biplane Vol: 42.7 ml 27.77 ml/m  AORTIC VALVE                    PULMONIC VALVE AV Area (Vmax):    2.13 cm     PV Vmax:       0.88 m/s AV Area (Vmean):   2.02 cm     PV Peak grad:  3.1 mmHg AV Area (VTI):     2.44 cm AV Vmax:           136.00 cm/s AV Vmean:          86.300 cm/s AV VTI:            0.261 m AV Peak Grad:      7.4 mmHg AV Mean Grad:      4.0 mmHg LVOT Vmax:         114.00 cm/s LVOT Vmean:        68.400 cm/s LVOT VTI:          0.250 m LVOT/AV VTI ratio: 0.96  AORTA Ao Root diam: 3.20 cm Ao Asc diam:  3.10 cm MITRAL VALVE               TRICUSPID VALVE MV Area (PHT): 3.03 cm    TR Peak grad:   28.7 mmHg MV Area VTI:    4.18 cm    TR Vmax:        268.00 cm/s MV Peak grad:  2.1 mmHg MV Mean grad:  1.0 mmHg    SHUNTS MV Vmax:       0.72  m/s    Systemic VTI:  0.25 m MV Vmean:      44.6 cm/s   Systemic Diam: 1.80 cm MV Decel Time: 250 msec MV E velocity: 56.80 cm/s MV A velocity: 71.40 cm/s MV E/A ratio:  0.80 Lyman Bishop MD Electronically signed by Lyman Bishop MD Signature Date/Time: 07/18/2021/10:43:35 AM    Final     ROS:  Pertinent items are noted in HPI.  Blood pressure 132/75, pulse 93, temperature 98.2 F (36.8 C), temperature source Oral, resp. rate (!) 21, height 5\' 3"  (1.6 m), weight 52.9 kg, SpO2 94 %. Physical Exam: Pleasant white female no acute distress Head is normocephalic, atraumatic Lungs clear to auscultation with your breath sounds bilaterally Heart examination reveals a regular rate and rhythm without S3, S4, murmurs Abdomen is soft.  It is nontender.  Bowel sounds active.  Well-healed midline surgical scars noted.  The left-sided colostomy does have fluid and air present.  No significant abdominal distention is noted.  CT scan images personally reviewed  Assessment/Plan: Impression: Partial small bowel obstruction, seems to be quickly resolving.  Patient thought it may be related to her bladder issues.  The NG tube is not putting out much aspirate. Plan: Have removed NG tube as bowel function seems to be returning.  We will start MiraLAX.  May be on clear liquid diet.  We will follow with you.  No need for acute surgical invention at this time.  Aviva Signs 07/18/2021, 11:10 AM

## 2021-07-18 NOTE — ED Notes (Signed)
Attempted report x 2 

## 2021-07-19 ENCOUNTER — Other Ambulatory Visit: Payer: Self-pay | Admitting: Nurse Practitioner

## 2021-07-19 ENCOUNTER — Inpatient Hospital Stay (HOSPITAL_COMMUNITY): Payer: Medicare Other

## 2021-07-19 DIAGNOSIS — K56609 Unspecified intestinal obstruction, unspecified as to partial versus complete obstruction: Secondary | ICD-10-CM | POA: Diagnosis not present

## 2021-07-19 DIAGNOSIS — I480 Paroxysmal atrial fibrillation: Secondary | ICD-10-CM

## 2021-07-19 LAB — BASIC METABOLIC PANEL
Anion gap: 4 — ABNORMAL LOW (ref 5–15)
BUN: 9 mg/dL (ref 8–23)
CO2: 28 mmol/L (ref 22–32)
Calcium: 8.4 mg/dL — ABNORMAL LOW (ref 8.9–10.3)
Chloride: 103 mmol/L (ref 98–111)
Creatinine, Ser: 0.75 mg/dL (ref 0.44–1.00)
GFR, Estimated: >60 mL/min (ref >60)
Glucose, Bld: 103 mg/dL — ABNORMAL HIGH (ref 70–99)
Potassium: 3.9 mmol/L (ref 3.5–5.1)
Sodium: 135 mmol/L (ref 135–145)

## 2021-07-19 LAB — MAGNESIUM: Magnesium: 2.4 mg/dL (ref 1.7–2.4)

## 2021-07-19 MED ORDER — METOPROLOL TARTRATE 25 MG PO TABS
25.0000 mg | ORAL_TABLET | Freq: Two times a day (BID) | ORAL | Status: DC
  Administered 2021-07-19: 11:00:00 25 mg via ORAL
  Filled 2021-07-19: qty 1

## 2021-07-19 MED ORDER — METOPROLOL TARTRATE 25 MG PO TABS: 25.0000 mg | ORAL_TABLET | Freq: Two times a day (BID) | ORAL | 2 refills | Status: DC

## 2021-07-19 NOTE — Progress Notes (Signed)
Progress Note  Patient Name: Brandi Rowe Date of Encounter: 07/19/2021  Primary Cardiologist: Pixie Casino, MD  Subjective   No recurrent afib.  Some abd cramping this AM, but overall much improved.  Didn't sleep well.  Hoping to go home.  Inpatient Medications    Scheduled Meds:  Chlorhexidine Gluconate Cloth  6 each Topical Daily   metoprolol tartrate  2.5 mg Intravenous Q6H   polyethylene glycol  17 g Oral BID   Continuous Infusions:  sodium chloride 75 mL/hr at 07/19/21 0644   diltiazem (CARDIZEM) infusion Stopped (07/18/21 0043)   piperacillin-tazobactam 3.375 g (07/19/21 0747)   PRN Meds: acetaminophen **OR** acetaminophen, LORazepam, morphine injection, ondansetron **OR** ondansetron (ZOFRAN) IV   Vital Signs    Vitals:   07/18/21 2000 07/18/21 2100 07/19/21 0742 07/19/21 0800  BP: 112/69 98/61  121/77  Pulse: 82   70  Resp: 19 12  15   Temp:  98 F (36.7 C) 97.9 F (36.6 C)   TempSrc:   Oral   SpO2: 99%   94%  Weight:      Height:        Intake/Output Summary (Last 24 hours) at 07/19/2021 0943 Last data filed at 07/18/2021 1514 Gross per 24 hour  Intake 482.13 ml  Output 75 ml  Net 407.13 ml   Filed Weights   07/17/21 2208 07/18/21 0400  Weight: 50.8 kg 52.9 kg    Physical Exam   GEN: Thin, in no acute distress.  HEENT: Grossly normal.  Neck: Supple, no JVD, carotid bruits, or masses. Cardiac: RRR, no murmurs, rubs, or gallops. No clubbing, cyanosis, edema.  Radials 2+, DP/PT 2+ and equal bilaterally.  Respiratory:  Respirations regular and unlabored, diminished breath sounds bilat. GI: Soft, nontender, nondistended, BS + x 4. MS: no deformity or atrophy. Skin: warm and dry, no rash. Neuro:  Strength and sensation are intact. Psych: AAOx3.  Normal affect.  Labs    Chemistry Recent Labs  Lab 07/17/21 2240 07/18/21 0432 07/19/21 0403  NA 137 137 135  K 3.2* 4.2 3.9  CL 97* 103 103  CO2 22 27 28   GLUCOSE 167* 126* 103*   BUN 18 18 9   CREATININE 0.79 0.71 0.75  CALCIUM 10.7* 8.9 8.4*  PROT 8.4* 6.0*  --   ALBUMIN 5.1* 3.6  --   AST 20 19  --   ALT 14 13  --   ALKPHOS 89 62  --   BILITOT 0.5 0.4  --   GFRNONAA >60 >60 >60  ANIONGAP 18* 7 4*     Hematology Recent Labs  Lab 07/17/21 2240 07/18/21 0432  WBC 15.6* 7.6  RBC 4.70 3.73*  HGB 15.8* 12.1  HCT 45.6 36.2  MCV 97.0 97.1  MCH 33.6 32.4  MCHC 34.6 33.4  RDW 13.8 13.9  PLT 252 166   Lipids  Lab Results  Component Value Date   CHOL 201 (H) 07/18/2021   HDL 78 07/18/2021   LDLCALC 82 07/18/2021   TRIG 206 (H) 07/18/2021   CHOLHDL 2.6 07/18/2021    HbA1c  Lab Results  Component Value Date   HGBA1C 5.4 09/24/2018    Radiology    DG Abd 1 View  Result Date: 07/18/2021 CLINICAL DATA:  Abdominal pain EXAM: ABDOMEN - 1 VIEW COMPARISON:  CT abdomen and pelvis 07/17/2021 FINDINGS: A few abnormally distended loops of small bowel identified in the mid abdomen measuring up to 3.5 cm in diameter. Left-sided ostomy. No suspicious calcifications  identified. Enteric tube tip in the stomach. IMPRESSION: Abnormal bowel gas pattern consistent with the obstruction seen on CT. Continued follow-up recommended. Electronically Signed   By: Ofilia Neas M.D.   On: 07/18/2021 09:23   CT ABDOMEN PELVIS W CONTRAST  Result Date: 07/18/2021 CLINICAL DATA:  Abdominal pain with decreased output from existing colostomy back. EXAM: CT ABDOMEN AND PELVIS WITH CONTRAST TECHNIQUE: Multidetector CT imaging of the abdomen and pelvis was performed using the standard protocol following bolus administration of intravenous contrast. CONTRAST:  15mL OMNIPAQUE IOHEXOL 300 MG/ML  SOLN COMPARISON:  January 27, 2021 FINDINGS: Lower chest: No acute abnormality. Hepatobiliary: No focal liver abnormality is seen. No gallstones, gallbladder wall thickening, or biliary dilatation. Pancreas: Unremarkable. No pancreatic ductal dilatation or surrounding inflammatory changes.  Spleen: Normal in size without focal abnormality. Adrenals/Urinary Tract: Adrenal glands are unremarkable. Kidneys are normal, without renal calculi, focal lesion, or hydronephrosis. The urinary bladder is partially collapsed and subsequently limited in evaluation. Stomach/Bowel: There is a small hiatal hernia. Appendix appears normal. Surgically anastomosed bowel is seen within the right lower quadrant and within the expected region of the mid to distal sigmoid colon. Multiple dilated, fluid-filled loops of small bowel are seen throughout the abdomen and pelvis (maximum small bowel diameter of approximately 3.4 cm). A transition zone is seen within the right lower quadrant, at the area of previously noted surgically anastomosed small bowel (axial CT images 45 through 48, CT series 2). Vascular/Lymphatic: Aortic atherosclerosis. No enlarged abdominal or pelvic lymph nodes. Reproductive: Uterus and bilateral adnexa are unremarkable. Other: There is a left lower quadrant ostomy site. A very small amount of perihepatic fluid is seen. Musculoskeletal: No acute or significant osseous findings. IMPRESSION: 1. Small-bowel obstruction with a transition zone seen within the right lower quadrant, at a site of surgically anastomosed small bowel. 2. Postoperative changes consistent with partial colectomy and subsequent left lower quadrant ostomy site. 3. Small hiatal hernia. 4. Aortic atherosclerosis. Aortic Atherosclerosis (ICD10-I70.0). Electronically Signed   By: Virgina Norfolk M.D.   On: 07/18/2021 00:01   DG Chest Port 1 View  Result Date: 07/18/2021 CLINICAL DATA:  Confirm nasogastric tube placement. EXAM: PORTABLE CHEST 1 VIEW COMPARISON:  January 04, 2021 FINDINGS: There are multiple overlying cardiac lead wires and mildly radiopaque tags. A nasogastric tube is seen with its distal end extending into the body of the stomach. The distal side hole sits at the approximate level of the gastroesophageal junction. Mild  atelectasis is seen within the left lung base. There is no evidence of a pleural effusion or pneumothorax. The heart size and mediastinal contours are within normal limits. Moderate severity calcification of the aortic arch is noted. The visualized skeletal structures are unremarkable. IMPRESSION: 1. Nasogastric tube with distal end in the body of the stomach. 2. Mild left basilar atelectasis. Electronically Signed   By: Virgina Norfolk M.D.   On: 07/18/2021 01:31   DG Abdomen Acute W/Chest  Result Date: 07/17/2021 CLINICAL DATA:  Chest and abdominal pain, history of prior colectomy and decreased semi output EXAM: DG ABDOMEN ACUTE WITH 1 VIEW CHEST COMPARISON:  01/27/2021 CT, chest x-ray from 01/04/2021 FINDINGS: Cardiac shadow is stable. Aortic calcifications are noted. The lungs are hyperinflated but clear. Scattered large and small bowel gas is noted. Left-sided ostomy is noted new from the prior CT examination. No free air is seen. No obstructive changes are noted. No acute bony abnormality is seen. IMPRESSION: No acute abnormality in the chest and abdomen. Electronically Signed  By: Alcide Clever M.D.   On: 07/17/2021 23:06   DG Abd Portable 1V  Result Date: 07/19/2021 CLINICAL DATA:  Follow-up small bowel obstruction. EXAM: PORTABLE ABDOMEN - 1 VIEW COMPARISON:  July 18, 2021 FINDINGS: The nasogastric tube seen on the prior study has been removed. Persistently dilated small bowel is seen overlying the mid to lower abdomen. Air is seen throughout normal loops of large bowel. An ostomy site is again seen overlying the mid to lower left abdomen. No radio-opaque calculi or other significant radiographic abnormality are seen. IMPRESSION: Persistent small bowel obstruction. Electronically Signed   By: Aram Candela M.D.   On: 07/19/2021 05:19   Telemetry    RSR - Personally Reviewed  Cardiac Studies   2D Echocardiogram 12.27.2022  1. Left ventricular ejection fraction, by estimation, is  50 to 55%. Left  ventricular ejection fraction by 2D MOD biplane is 53.5 %. The left  ventricle has low normal function. The left ventricle has no regional wall  motion abnormalities. Left  ventricular diastolic parameters are consistent with Grade I diastolic  dysfunction (impaired relaxation).   2. Right ventricular systolic function is normal. The right ventricular  size is normal. There is mildly elevated pulmonary artery systolic  pressure. The estimated right ventricular systolic pressure is 36.7 mmHg.   3. Right atrial size was moderately dilated.   4. The mitral valve is grossly normal. Trivial mitral valve  regurgitation.   5. The aortic valve is tricuspid. Aortic valve regurgitation is not  visualized.   6. The inferior vena cava is normal in size with <50% respiratory  variability, suggesting right atrial pressure of 8 mmHg.   7. Rhythm strip during this exam demostrated normal sinus rhythm.   Patient Profile      65 y.o. 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  Assessment & Plan    1.  PAF:  presented to ED 12/26 w/ abd pain and SBO, found to be in rapid afib.  This resolved with IV dilt and placement of NGT for decompression.  No recurrent afib on tele.  Echo w/ nl EF - no significant valvular dzs.  Cont ? blocker - will transition to 25mg  PO BID as she is taking POs this AM.  As prev noted, given brief nature of Afib in setting of abd pain/partial SBO with possibility of surgical intervention (though doesn't appear likely) we will hold off on initiation of OAC.  CHA2DS2VASc = 3.  Will arrange 2 wk zio @ d/c to assess for recurrent afib, if none seen during remainder of hosp.  2.  Partial SBO:  seen by surgery.  Conservative mgmt.  Tolerating oral liquids.  Hungry for "real food."  3.  Tob/ETOH use:  cessation  advised.  4.  HL:  TC 201, LDL 82.  Ao athero on CT.  10 yr risk of CV event = 9.4%.  Would likely benefit from low dose statin - can discuss further in outpt setting.  Signed, , NP  07/19/2021, 9:43 AM    For questions or updates, please contact   Please consult [email or web address] for contact info under Cardiology/STEMI.

## 2021-07-19 NOTE — Discharge Summary (Signed)
Physician Discharge Summary  MERIAM CHOJNOWSKI KKX:[id number] DOB: [birth year] DOA: 07/17/2021  PCP: Valentino Nose, NP  Admit date: 07/17/2021  Discharge date: 07/19/2021  Admitted From:Home  Disposition:  Home  Recommendations for Outpatient Follow-up:  Follow up with PCP in 1-2 weeks Follow-up with cardiology in 2 weeks and remain on Zio patch as ordered Continue metoprolol 25 mg p.o. twice daily until further follow-up and discuss initiation of statin at that time as well Continue other home medications as prior Follow-up with general surgery as needed  Home Health: None  Equipment/Devices: None  Discharge Condition:Stable  CODE STATUS: Full  Diet recommendation: Heart Healthy  Brief/Interim Summary:  Brandi Rowe  is a 65 y.o. female, with history of acute febrile illness, dehydration, gout, nausea, recent partial colectomy with colostomy, presented to ED with a chief complaint of abdominal pain.  CT of the abdomen and pelvis showed SBO with transition zone in the right lower quadrant.  She was seen by general surgery noted to have a partial small bowel obstruction which was quickly resolving.  NG tube was quickly removed.  And her diet was advanced to clear liquids.  She is now tolerating a regular diet and is having output from her ostomy with no nausea or vomiting or significant abdominal pain or discomfort.  She was also noted to have paroxysmal atrial fibrillation with some RVR initially and she was seen by cardiology after initiation of IV diltiazem drip.  After she was tolerating diet she was transitioned to oral metoprolol and thought was to hold off on anticoagulation for now given the possibility for need of surgery.  She will follow-up with general surgery as needed and will follow up with cardiology outpatient with Zio patch ordered.  No other acute events noted throughout the course of this stay and she is currently in sinus rhythm.  Discharge Diagnoses:   Principal Problem:   SBO (small bowel obstruction) (HCC) Active Problems:   Tobacco use disorder   New onset atrial fibrillation (HCC)   Hypokalemia  Principal discharge diagnosis: Partial SBO.  New onset atrial fibrillation.  Discharge Instructions  Discharge Instructions     Ambulatory referral to Cardiology   Complete by: As directed    Diet - low sodium heart healthy   Complete by: As directed    Increase activity slowly   Complete by: As directed       Allergies as of 07/19/2021       Reactions   Grass Extracts [gramineae Pollens]    Dust, mold, mites        Medication List     TAKE these medications    acetaminophen 500 MG tablet Commonly known as: TYLENOL Take 500 mg by mouth 2 (two) times daily as needed for moderate pain or headache.   aspirin EC 81 MG tablet Take 81 mg by mouth daily.   calcium carbonate 500 MG chewable tablet Commonly known as: TUMS - dosed in mg elemental calcium Chew 1 tablet by mouth daily as needed for indigestion or heartburn.   clonazePAM 1 MG tablet Commonly known as: KLONOPIN Take 1 tablet (1 mg total) by mouth daily as needed for anxiety.   diphenhydrAMINE 25 MG tablet Commonly known as: BENADRYL Take 25 mg by mouth at bedtime as needed for allergies.   ferrous sulfate 325 (65 FE) MG EC tablet Take 1 tablet (325 mg total) by mouth 2 (two) times daily. What changed: when to take this   folic acid 400 MCG tablet  Commonly known as: FOLVITE Take 400 mcg by mouth every other day.   metoprolol tartrate 25 MG tablet Commonly known as: LOPRESSOR Take 1 tablet (25 mg total) by mouth 2 (two) times daily.   REFRESH OP Place 1 drop into both eyes daily as needed (dry eyes).   vitamin B-12 1000 MCG tablet Commonly known as: CYANOCOBALAMIN Take 1 tablet (1,000 mcg total) by mouth daily. What changed: when to take this        Follow-up Information     Valentino NoseMartinez, Jessica A, NP. Schedule an appointment as soon as  possible for a visit in 1 week(s).   Specialty: Nurse Practitioner Contact information: [address] [place] [address] [phone]         Chrystie NoseHilty, Kenneth C, MD. Schedule an appointment as soon as possible for a visit in 2 week(s).   Specialty: Cardiology Contact information: [address] [place] [place] [address] [phone]         Franky MachoJenkins, Mark, MD. Schedule an appointment as soon as possible for a visit.   Specialty: General Surgery Why: As needed Contact information: [address] [place] [id number] [phone]                Allergies  Allergen Reactions   Grass Extracts [Gramineae Pollens]     Dust, mold, mites    Consultations: Cardiology General surgery   Procedures/Studies: DG Abd 1 View  Result Date: 07/18/2021 CLINICAL DATA:  Abdominal pain EXAM: ABDOMEN - 1 VIEW COMPARISON:  CT abdomen and pelvis 07/17/2021 FINDINGS: A few abnormally distended loops of small bowel identified in the mid abdomen measuring up to 3.5 cm in diameter. Left-sided ostomy. No suspicious calcifications identified. Enteric tube tip in the stomach. IMPRESSION: Abnormal bowel gas pattern consistent with the obstruction seen on CT. Continued follow-up recommended. Electronically Signed   By: Jannifer Hickelaney  Williams M.D.   On: 07/18/2021 09:23   CT ABDOMEN PELVIS W CONTRAST  Result Date: 07/18/2021 CLINICAL DATA:  Abdominal pain with decreased output from existing colostomy back. EXAM: CT ABDOMEN AND PELVIS WITH CONTRAST TECHNIQUE: Multidetector CT imaging of the abdomen and pelvis was performed using the standard protocol following bolus administration of intravenous contrast. CONTRAST:  100mL OMNIPAQUE IOHEXOL 300 MG/ML  SOLN COMPARISON:  January 27, 2021 FINDINGS: Lower chest: No acute abnormality. Hepatobiliary: No focal liver abnormality is seen. No gallstones, gallbladder wall thickening, or biliary dilatation. Pancreas: Unremarkable. No pancreatic  ductal dilatation or surrounding inflammatory changes. Spleen: Normal in size without focal abnormality. Adrenals/Urinary Tract: Adrenal glands are unremarkable. Kidneys are normal, without renal calculi, focal lesion, or hydronephrosis. The urinary bladder is partially collapsed and subsequently limited in evaluation. Stomach/Bowel: There is a small hiatal hernia. Appendix appears normal. Surgically anastomosed bowel is seen within the right lower quadrant and within the expected region of the mid to distal sigmoid colon. Multiple dilated, fluid-filled loops of small bowel are seen throughout the abdomen and pelvis (maximum small bowel diameter of approximately 3.4 cm). A transition zone is seen within the right lower quadrant, at the area of previously noted surgically anastomosed small bowel (axial CT images 45 through 48, CT series 2). Vascular/Lymphatic: Aortic atherosclerosis. No enlarged abdominal or pelvic lymph nodes. Reproductive: Uterus and bilateral adnexa are unremarkable. Other: There is a left lower quadrant ostomy site. A very small amount of perihepatic fluid is seen. Musculoskeletal: No acute or significant osseous findings. IMPRESSION: 1. Small-bowel obstruction with a transition zone seen within the right lower quadrant,  at a site of surgically anastomosed small bowel. 2. Postoperative changes consistent with partial colectomy and subsequent left lower quadrant ostomy site. 3. Small hiatal hernia. 4. Aortic atherosclerosis. Aortic Atherosclerosis (ICD10-I70.0). Electronically Signed   By: Virgina Norfolk M.D.   On: 07/18/2021 00:01   DG Chest Port 1 View  Result Date: 07/18/2021 CLINICAL DATA:  Confirm nasogastric tube placement. EXAM: PORTABLE CHEST 1 VIEW COMPARISON:  January 04, 2021 FINDINGS: There are multiple overlying cardiac lead wires and mildly radiopaque tags. A nasogastric tube is seen with its distal end extending into the body of the stomach. The distal side hole sits at the  approximate level of the gastroesophageal junction. Mild atelectasis is seen within the left lung base. There is no evidence of a pleural effusion or pneumothorax. The heart size and mediastinal contours are within normal limits. Moderate severity calcification of the aortic arch is noted. The visualized skeletal structures are unremarkable. IMPRESSION: 1. Nasogastric tube with distal end in the body of the stomach. 2. Mild left basilar atelectasis. Electronically Signed   By: Virgina Norfolk M.D.   On: 07/18/2021 01:31   DG Abdomen Acute W/Chest  Result Date: 07/17/2021 CLINICAL DATA:  Chest and abdominal pain, history of prior colectomy and decreased semi output EXAM: DG ABDOMEN ACUTE WITH 1 VIEW CHEST COMPARISON:  01/27/2021 CT, chest x-ray from 01/04/2021 FINDINGS: Cardiac shadow is stable. Aortic calcifications are noted. The lungs are hyperinflated but clear. Scattered large and small bowel gas is noted. Left-sided ostomy is noted new from the prior CT examination. No free air is seen. No obstructive changes are noted. No acute bony abnormality is seen. IMPRESSION: No acute abnormality in the chest and abdomen. Electronically Signed   By: Inez Catalina M.D.   On: 07/17/2021 23:06   DG Abd Portable 1V  Result Date: 07/19/2021 CLINICAL DATA:  Follow-up small bowel obstruction. EXAM: PORTABLE ABDOMEN - 1 VIEW COMPARISON:  July 18, 2021 FINDINGS: The nasogastric tube seen on the prior study has been removed. Persistently dilated small bowel is seen overlying the mid to lower abdomen. Air is seen throughout normal loops of large bowel. An ostomy site is again seen overlying the mid to lower left abdomen. No radio-opaque calculi or other significant radiographic abnormality are seen. IMPRESSION: Persistent small bowel obstruction. Electronically Signed   By: Virgina Norfolk M.D.   On: 07/19/2021 05:19   ECHOCARDIOGRAM COMPLETE  Result Date: 07/18/2021    ECHOCARDIOGRAM REPORT   Patient Name:    Brandi Rowe Date of Exam: 07/18/2021 Medical Rec #:  [id number]       Height:       63.0 in Accession #:    [id number]      Weight:       116.6 lb Date of Birth:  26-Oct-1955       BSA:          1.538 m Patient Age:    65 years        BP:           132/75 mmHg Patient Gender: F               HR:           85 bpm. Exam Location:  [facility] Procedure: 2D Echo, Cardiac Doppler and Color Doppler Indications:    Atrial Fibrillation  History:        Patient has no prior history of Echocardiogram examinations.  Arrythmias:Atrial Fibrillation.  Sonographer:    Wenda Low Referring Phys: [id number] ASIA B Kinder  1. Left ventricular ejection fraction, by estimation, is 50 to 55%. Left ventricular ejection fraction by 2D MOD biplane is 53.5 %. The left ventricle has low normal function. The left ventricle has no regional wall motion abnormalities. Left ventricular diastolic parameters are consistent with Grade I diastolic dysfunction (impaired relaxation).  2. Right ventricular systolic function is normal. The right ventricular size is normal. There is mildly elevated pulmonary artery systolic pressure. The estimated right ventricular systolic pressure is 123XX123 mmHg.  3. Right atrial size was moderately dilated.  4. The mitral valve is grossly normal. Trivial mitral valve regurgitation.  5. The aortic valve is tricuspid. Aortic valve regurgitation is not visualized.  6. The inferior vena cava is normal in size with <50% respiratory variability, suggesting right atrial pressure of 8 mmHg.  7. Rhythm strip during this exam demostrated normal sinus rhythm. Comparison(s): No prior Echocardiogram. FINDINGS  Left Ventricle: Left ventricular ejection fraction, by estimation, is 50 to 55%. Left ventricular ejection fraction by 2D MOD biplane is 53.5 %. The left ventricle has low normal function. The left ventricle has no regional wall motion abnormalities. The left ventricular internal cavity  size was normal in size. There is no left ventricular hypertrophy. Left ventricular diastolic parameters are consistent with Grade I diastolic dysfunction (impaired relaxation). Indeterminate filling pressures. Right Ventricle: The right ventricular size is normal. No increase in right ventricular wall thickness. Right ventricular systolic function is normal. There is mildly elevated pulmonary artery systolic pressure. The tricuspid regurgitant velocity is 2.68  m/s, and with an assumed right atrial pressure of 8 mmHg, the estimated right ventricular systolic pressure is 123XX123 mmHg. Left Atrium: Left atrial size was normal in size. Right Atrium: Right atrial size was moderately dilated. Pericardium: There is no evidence of pericardial effusion. Mitral Valve: The mitral valve is grossly normal. Trivial mitral valve regurgitation. MV peak gradient, 2.1 mmHg. The mean mitral valve gradient is 1.0 mmHg. Tricuspid Valve: The tricuspid valve is grossly normal. Tricuspid valve regurgitation is mild. Aortic Valve: The aortic valve is tricuspid. Aortic valve regurgitation is not visualized. Aortic valve mean gradient measures 4.0 mmHg. Aortic valve peak gradient measures 7.4 mmHg. Aortic valve area, by VTI measures 2.44 cm. Pulmonic Valve: The pulmonic valve was grossly normal. Pulmonic valve regurgitation is trivial. Aorta: The aortic root and ascending aorta are structurally normal, with no evidence of dilitation. Venous: The inferior vena cava is normal in size with less than 50% respiratory variability, suggesting right atrial pressure of 8 mmHg. IAS/Shunts: No atrial level shunt detected by color flow Doppler. EKG: Rhythm strip during this exam demostrated normal sinus rhythm.  LEFT VENTRICLE PLAX 2D                        Biplane EF (MOD) LVIDd:         4.10 cm         LV Biplane EF:   Left LVIDs:         3.00 cm                          ventricular LV PW:         0.90 cm                          ejection LV IVS:  0.60 cm                          fraction by LVOT diam:     1.80 cm                          2D MOD LV SV:         64                               biplane is LV SV Index:   41                               53.5 %. LVOT Area:     2.54 cm                                Diastology                                LV e' medial:    6.89 cm/s LV Volumes (MOD)               LV E/e' medial:  8.2 LV vol d, MOD    34.7 ml       LV e' lateral:   7.78 cm/s A2C:                           LV E/e' lateral: 7.3 LV vol d, MOD    35.3 ml A4C: LV vol s, MOD    15.0 ml A2C: LV vol s, MOD    17.2 ml A4C: LV SV MOD A2C:   19.7 ml LV SV MOD A4C:   35.3 ml LV SV MOD BP:    18.6 ml RIGHT VENTRICLE RV Basal diam:  3.30 cm RV Mid diam:    2.40 cm RV S prime:     11.30 cm/s TAPSE (M-mode): 2.3 cm LEFT ATRIUM             Index        RIGHT ATRIUM           Index LA diam:        3.40 cm 2.21 cm/m   RA Area:     20.30 cm LA Vol (A2C):   43.8 ml 28.49 ml/m  RA Volume:   68.60 ml  44.62 ml/m LA Vol (A4C):   39.2 ml 25.50 ml/m LA Biplane Vol: 42.7 ml 27.77 ml/m  AORTIC VALVE                    PULMONIC VALVE AV Area (Vmax):    2.13 cm     PV Vmax:       0.88 m/s AV Area (Vmean):   2.02 cm     PV Peak grad:  3.1 mmHg AV Area (VTI):     2.44 cm AV Vmax:           136.00 cm/s AV Vmean:          86.300 cm/s AV VTI:            0.261 m AV Peak Grad:      7.4 mmHg  AV Mean Grad:      4.0 mmHg LVOT Vmax:         114.00 cm/s LVOT Vmean:        68.400 cm/s LVOT VTI:          0.250 m LVOT/AV VTI ratio: 0.96  AORTA Ao Root diam: 3.20 cm Ao Asc diam:  3.10 cm MITRAL VALVE               TRICUSPID VALVE MV Area (PHT): 3.03 cm    TR Peak grad:   28.7 mmHg MV Area VTI:   4.18 cm    TR Vmax:        268.00 cm/s MV Peak grad:  2.1 mmHg MV Mean grad:  1.0 mmHg    SHUNTS MV Vmax:       0.72 m/s    Systemic VTI:  0.25 m MV Vmean:      44.6 cm/s   Systemic Diam: 1.80 cm MV Decel Time: 250 msec MV E velocity: 56.80 cm/s MV A velocity: 71.40 cm/s MV E/A ratio:   0.80 Lyman Bishop MD Electronically signed by Lyman Bishop MD Signature Date/Time: 07/18/2021/10:43:35 AM    Final      Discharge Exam: Vitals:   07/19/21 0742 07/19/21 0800  BP:  121/77  Pulse:  70  Resp:  15  Temp: 97.9 F (36.6 C)   SpO2:  94%   Vitals:   07/18/21 2000 07/18/21 2100 07/19/21 0742 07/19/21 0800  BP: 112/69 98/61  121/77  Pulse: 82   70  Resp: 19 12  15   Temp:  98 F (36.7 C) 97.9 F (36.6 C)   TempSrc:   Oral   SpO2: 99%   94%  Weight:      Height:        General: Pt is alert, awake, not in acute distress Cardiovascular: RRR, S1/S2 +, no rubs, no gallops Respiratory: CTA bilaterally, no wheezing, no rhonchi Abdominal: Soft, NT, ND, bowel sounds + Extremities: no edema, no cyanosis    The results of significant diagnostics from this hospitalization (including imaging, microbiology, ancillary and laboratory) are listed below for reference.     Microbiology: Recent Results (from the past 240 hour(s))  Resp Panel by RT-PCR (Flu A&B, Covid) Nasopharyngeal Swab     Status: None   Collection Time: 07/17/21 10:46 PM   Specimen: Nasopharyngeal Swab; Nasopharyngeal(NP) swabs in vial transport medium  Result Value Ref Range Status   SARS Coronavirus 2 by RT PCR NEGATIVE NEGATIVE Final    Comment: (NOTE) SARS-CoV-2 target nucleic acids are NOT DETECTED.  The SARS-CoV-2 RNA is generally detectable in upper respiratory specimens during the acute phase of infection. The lowest concentration of SARS-CoV-2 viral copies this assay can detect is 138 copies/mL. A negative result does not preclude SARS-Cov-2 infection and should not be used as the sole basis for treatment or other patient management decisions. A negative result may occur with  improper specimen collection/handling, submission of specimen other than nasopharyngeal swab, presence of viral mutation(s) within the areas targeted by this assay, and inadequate number of viral copies(<138 copies/mL).  A negative result must be combined with clinical observations, patient history, and epidemiological information. The expected result is Negative.  Fact Sheet for Patients:  EntrepreneurPulse.com.au  Fact Sheet for Healthcare Providers:  IncredibleEmployment.be  This test is no t yet approved or cleared by the [place] FDA and  has been authorized for detection and/or diagnosis of SARS-CoV-2 by FDA under an Emergency  Use Authorization (EUA). This EUA will remain  in effect (meaning this test can be used) for the duration of the COVID-19 declaration under Section 564(b)(1) of the Act, 21 U.S.C.section 360bbb-3(b)(1), unless the authorization is terminated  or revoked sooner.       Influenza A by PCR NEGATIVE NEGATIVE Final   Influenza B by PCR NEGATIVE NEGATIVE Final    Comment: (NOTE) The Xpert Xpress SARS-CoV-2/FLU/RSV plus assay is intended as an aid in the diagnosis of influenza from Nasopharyngeal swab specimens and should not be used as a sole basis for treatment. Nasal washings and aspirates are unacceptable for Xpert Xpress SARS-CoV-2/FLU/RSV testing.  Fact Sheet for Patients: EntrepreneurPulse.com.au  Fact Sheet for Healthcare Providers: IncredibleEmployment.be  This test is not yet approved or cleared by the [place] FDA and has been authorized for detection and/or diagnosis of SARS-CoV-2 by FDA under an Emergency Use Authorization (EUA). This EUA will remain in effect (meaning this test can be used) for the duration of the COVID-19 declaration under Section 564(b)(1) of the Act, 21 U.S.C. section 360bbb-3(b)(1), unless the authorization is terminated or revoked.  Performed at [facility], [address] [place], [place] [address]   Culture, blood (routine x 2)     Status: None (Preliminary result)   Collection Time: 07/18/21 12:14 AM   Specimen: Left Antecubital; Blood  Result  Value Ref Range Status   Specimen Description LEFT ANTECUBITAL  Final   Special Requests   Final    BOTTLES DRAWN AEROBIC AND ANAEROBIC Blood Culture results may not be optimal due to an excessive volume of blood received in culture bottles   Culture   Final    NO GROWTH 1 DAY Performed at [facility], [address] [place], [place] [address]    Report Status PENDING  Incomplete  Culture, blood (routine x 2)     Status: None (Preliminary result)   Collection Time: 07/18/21 12:14 AM   Specimen: BLOOD LEFT HAND  Result Value Ref Range Status   Specimen Description BLOOD LEFT HAND  Final   Special Requests   Final    BOTTLES DRAWN AEROBIC AND ANAEROBIC Blood Culture results may not be optimal due to an excessive volume of blood received in culture bottles   Culture   Final    NO GROWTH 1 DAY Performed at [facility], [address] [place], [place] [address]    Report Status PENDING  Incomplete  MRSA Next Gen by PCR, Nasal     Status: None   Collection Time: 07/18/21  4:29 AM   Specimen: Nasal Mucosa; Nasal Swab  Result Value Ref Range Status   MRSA by PCR Next Gen NOT DETECTED NOT DETECTED Final    Comment: (NOTE) The GeneXpert MRSA Assay (FDA approved for NASAL specimens only), is one component of a comprehensive MRSA colonization surveillance program. It is not intended to diagnose MRSA infection nor to guide or monitor treatment for MRSA infections. Test performance is not FDA approved in patients less than 53 years old. Performed at [facility], [address] [place], [place] [address]      Labs: BNP (last 3 results) No results for input(s): BNP in the last 8760 hours. Basic Metabolic Panel: Recent Labs  Lab 07/17/21 2240 07/18/21 0432 07/19/21 0403  NA 137 137 135  K 3.2* 4.2 3.9  CL 97* 103 103  CO2 22 27 28   GLUCOSE 167* 126* 103*  BUN 18 18 9   CREATININE 0.79 0.71 0.75  CALCIUM  10.7* 8.9 8.4*  MG  --  1.5* 2.4   Liver Function Tests: Recent Labs   Lab 07/17/21 2240 07/18/21 0432  AST 20 19  ALT 14 13  ALKPHOS 89 62  BILITOT 0.5 0.4  PROT 8.4* 6.0*  ALBUMIN 5.1* 3.6   Recent Labs  Lab 07/17/21 2240  LIPASE 33   No results for input(s): AMMONIA in the last 168 hours. CBC: Recent Labs  Lab 07/17/21 2240 07/18/21 0432  WBC 15.6* 7.6  NEUTROABS 12.4* 6.3  HGB 15.8* 12.1  HCT 45.6 36.2  MCV 97.0 97.1  PLT 252 166   Cardiac Enzymes: No results for input(s): CKTOTAL, CKMB, CKMBINDEX, TROPONINI in the last 168 hours. BNP: Invalid input(s): POCBNP CBG: No results for input(s): GLUCAP in the last 168 hours. D-Dimer No results for input(s): DDIMER in the last 72 hours. Hgb A1c No results for input(s): HGBA1C in the last 72 hours. Lipid Profile Recent Labs    07/18/21 0215  CHOL 201*  HDL 78  LDLCALC 82  TRIG 206*  CHOLHDL 2.6   Thyroid function studies Recent Labs    07/18/21 0432  TSH 1.201   Anemia work up No results for input(s): VITAMINB12, FOLATE, FERRITIN, TIBC, IRON, RETICCTPCT in the last 72 hours. Urinalysis    Component Value Date/Time   COLORURINE YELLOW 01/04/2021 1424   APPEARANCEUR Hazy (A) 03/01/2021 1415   LABSPEC 1.014 01/04/2021 1424   PHURINE 5.0 01/04/2021 1424   GLUCOSEU Negative 03/01/2021 1415   HGBUR LARGE (A) 01/04/2021 1424   BILIRUBINUR Negative 03/01/2021 1415   KETONESUR 20 (A) 01/04/2021 1424   PROTEINUR 2+ (A) 03/01/2021 1415   PROTEINUR 30 (A) 01/04/2021 1424   NITRITE Negative 03/01/2021 1415   NITRITE NEGATIVE 01/04/2021 1424   LEUKOCYTESUR 1+ (A) 03/01/2021 1415   LEUKOCYTESUR LARGE (A) 01/04/2021 1424   Sepsis Labs Invalid input(s): PROCALCITONIN,  WBC,  LACTICIDVEN Microbiology Recent Results (from the past 240 hour(s))  Resp Panel by RT-PCR (Flu A&B, Covid) Nasopharyngeal Swab     Status: None   Collection Time: 07/17/21 10:46 PM   Specimen: Nasopharyngeal Swab; Nasopharyngeal(NP) swabs in vial transport medium  Result Value Ref Range Status   SARS  Coronavirus 2 by RT PCR NEGATIVE NEGATIVE Final    Comment: (NOTE) SARS-CoV-2 target nucleic acids are NOT DETECTED.  The SARS-CoV-2 RNA is generally detectable in upper respiratory specimens during the acute phase of infection. The lowest concentration of SARS-CoV-2 viral copies this assay can detect is 138 copies/mL. A negative result does not preclude SARS-Cov-2 infection and should not be used as the sole basis for treatment or other patient management decisions. A negative result may occur with  improper specimen collection/handling, submission of specimen other than nasopharyngeal swab, presence of viral mutation(s) within the areas targeted by this assay, and inadequate number of viral copies(<138 copies/mL). A negative result must be combined with clinical observations, patient history, and epidemiological information. The expected result is Negative.  Fact Sheet for Patients:  EntrepreneurPulse.com.au  Fact Sheet for Healthcare Providers:  IncredibleEmployment.be  This test is no t yet approved or cleared by the [place] FDA and  has been authorized for detection and/or diagnosis of SARS-CoV-2 by FDA under an Emergency Use Authorization (EUA). This EUA will remain  in effect (meaning this test can be used) for the duration of the COVID-19 declaration under Section 564(b)(1) of the Act, 21 U.S.C.section 360bbb-3(b)(1), unless the authorization is terminated  or revoked sooner.  Influenza A by PCR NEGATIVE NEGATIVE Final   Influenza B by PCR NEGATIVE NEGATIVE Final    Comment: (NOTE) The Xpert Xpress SARS-CoV-2/FLU/RSV plus assay is intended as an aid in the diagnosis of influenza from Nasopharyngeal swab specimens and should not be used as a sole basis for treatment. Nasal washings and aspirates are unacceptable for Xpert Xpress SARS-CoV-2/FLU/RSV testing.  Fact Sheet for  Patients: EntrepreneurPulse.com.au  Fact Sheet for Healthcare Providers: IncredibleEmployment.be  This test is not yet approved or cleared by the [place] FDA and has been authorized for detection and/or diagnosis of SARS-CoV-2 by FDA under an Emergency Use Authorization (EUA). This EUA will remain in effect (meaning this test can be used) for the duration of the COVID-19 declaration under Section 564(b)(1) of the Act, 21 U.S.C. section 360bbb-3(b)(1), unless the authorization is terminated or revoked.  Performed at [facility], [address] [place], [place] [address]   Culture, blood (routine x 2)     Status: None (Preliminary result)   Collection Time: 07/18/21 12:14 AM   Specimen: Left Antecubital; Blood  Result Value Ref Range Status   Specimen Description LEFT ANTECUBITAL  Final   Special Requests   Final    BOTTLES DRAWN AEROBIC AND ANAEROBIC Blood Culture results may not be optimal due to an excessive volume of blood received in culture bottles   Culture   Final    NO GROWTH 1 DAY Performed at [facility], [address], [place], [place] [address]    Report Status PENDING  Incomplete  Culture, blood (routine x 2)     Status: None (Preliminary result)   Collection Time: 07/18/21 12:14 AM   Specimen: BLOOD LEFT HAND  Result Value Ref Range Status   Specimen Description BLOOD LEFT HAND  Final   Special Requests   Final    BOTTLES DRAWN AEROBIC AND ANAEROBIC Blood Culture results may not be optimal due to an excessive volume of blood received in culture bottles   Culture   Final    NO GROWTH 1 DAY Performed at [facility], [address], [place], [place] [address]    Report Status PENDING  Incomplete  MRSA Next Gen by PCR, Nasal     Status: None   Collection Time: 07/18/21  4:29 AM   Specimen: Nasal Mucosa; Nasal Swab  Result Value Ref Range Status   MRSA by PCR Next Gen NOT DETECTED NOT DETECTED Final    Comment:  (NOTE) The GeneXpert MRSA Assay (FDA approved for NASAL specimens only), is one component of a comprehensive MRSA colonization surveillance program. It is not intended to diagnose MRSA infection nor to guide or monitor treatment for MRSA infections. Test performance is not FDA approved in patients less than 63 years old. Performed at [facility], [address] [place], [place] [address]      Time coordinating discharge: 35 minutes  SIGNED:   Rodena Goldmann, DO [facility] 07/19/2021, 10:37 AM  If 7PM-7AM, please contact night-coverage [email or web address]

## 2021-07-19 NOTE — Progress Notes (Signed)
Patient alert and oriented x4. No complaints of pain, shortness of breath, chest pain, dizziness, nausea or vomiting. Patient up out of bed, ambulatory independently with steady gait. Colostomy bag dry and intact with no leaks noted. IV removed with NO complications. Patient tolerated PO meds and diet well, appetite fair. Patient ate about 25% of lunch and stated she felt ok wit no complaints a couple hours after eating. Dr Sherryll Burger and Dr Lovell Sheehan made aware. Went over discharged summary, scheduling follow-up appointments and medication education gone over with patient. All questions answered and patient expressed full understanding of summary, follow up appointment info and education with teach back. Patient discharged home with all belongings via car.

## 2021-07-19 NOTE — Progress Notes (Signed)
Subjective: Patient tolerating clear liquid diet well.  Has emptied her ostomy bag multiple times.  Denies any abdominal pain or nausea.  Objective: Vital signs in last 24 hours: Temp:  [97.9 F (36.6 C)-98.3 F (36.8 C)] 97.9 F (36.6 C) (12/28 0742) Pulse Rate:  [70-93] 70 (12/28 0800) Resp:  [12-21] 15 (12/28 0800) BP: (98-121)/(61-82) 121/77 (12/28 0800) SpO2:  [93 %-99 %] 94 % (12/28 0800) Last BM Date: 07/19/21  Intake/Output from previous day: 12/27 0701 - 12/28 0700 In: 482.1 [I.V.:332.1; IV Piggyback:150] Out: 75 [Stool:75] Intake/Output this shift: No intake/output data recorded.  General appearance: alert, cooperative, and no distress GI: Soft, colostomy pink and patent.  Lab Results:  Recent Labs    07/17/21 2240 07/18/21 0432  WBC 15.6* 7.6  HGB 15.8* 12.1  HCT 45.6 36.2  PLT 252 166   BMET Recent Labs    07/18/21 0432 07/19/21 0403  NA 137 135  K 4.2 3.9  CL 103 103  CO2 27 28  GLUCOSE 126* 103*  BUN 18 9  CREATININE 0.71 0.75  CALCIUM 8.9 8.4*   PT/INR Recent Labs    07/18/21 0432  LABPROT 12.6  INR 0.9    Studies/Results: DG Abd 1 View  Result Date: 07/18/2021 CLINICAL DATA:  Abdominal pain EXAM: ABDOMEN - 1 VIEW COMPARISON:  CT abdomen and pelvis 07/17/2021 FINDINGS: A few abnormally distended loops of small bowel identified in the mid abdomen measuring up to 3.5 cm in diameter. Left-sided ostomy. No suspicious calcifications identified. Enteric tube tip in the stomach. IMPRESSION: Abnormal bowel gas pattern consistent with the obstruction seen on CT. Continued follow-up recommended. Electronically Signed   By: Jannifer Hick M.D.   On: 07/18/2021 09:23   CT ABDOMEN PELVIS W CONTRAST  Result Date: 07/18/2021 CLINICAL DATA:  Abdominal pain with decreased output from existing colostomy back. EXAM: CT ABDOMEN AND PELVIS WITH CONTRAST TECHNIQUE: Multidetector CT imaging of the abdomen and pelvis was performed using the standard  protocol following bolus administration of intravenous contrast. CONTRAST:  OMNIPAQUE IOHEXOL 300 MG/ML  SOLN COMPARISON:  January 27, 2021 FINDINGS: Lower chest: No acute abnormality. Hepatobiliary: No focal liver abnormality is seen. No gallstones, gallbladder wall thickening, or biliary dilatation. Pancreas: Unremarkable. No pancreatic ductal dilatation or surrounding inflammatory changes. Spleen: Normal in size without focal abnormality. Adrenals/Urinary Tract: Adrenal glands are unremarkable. Kidneys are normal, without renal calculi, focal lesion, or hydronephrosis. The urinary bladder is partially collapsed and subsequently limited in evaluation. Stomach/Bowel: There is a small hiatal hernia. Appendix appears normal. Surgically anastomosed bowel is seen within the right lower quadrant and within the expected region of the mid to distal sigmoid colon. Multiple dilated, fluid-filled loops of small bowel are seen throughout the abdomen and pelvis (maximum small bowel diameter of approximately 3.4 cm). A transition zone is seen within the right lower quadrant, at the area of previously noted surgically anastomosed small bowel (axial CT images 45 through 48, CT series 2). Vascular/Lymphatic: Aortic atherosclerosis. No enlarged abdominal or pelvic lymph nodes. Reproductive: Uterus and bilateral adnexa are unremarkable. Other: There is a left lower quadrant ostomy site. A very small amount of perihepatic fluid is seen. Musculoskeletal: No acute or significant osseous findings. IMPRESSION: 1. Small-bowel obstruction with a transition zone seen within the right lower quadrant, at a site of surgically anastomosed small bowel. 2. Postoperative changes consistent with partial colectomy and subsequent left lower quadrant ostomy site. 3. Small hiatal hernia. 4. Aortic atherosclerosis. Aortic Atherosclerosis (ICD10-I70.0). Electronically Signed  By: Aram Candela M.D.   On: 07/18/2021 00:01   DG Chest Port 1  View  Result Date: 07/18/2021 CLINICAL DATA:  Confirm nasogastric tube placement. EXAM: PORTABLE CHEST 1 VIEW COMPARISON:  January 04, 2021 FINDINGS: There are multiple overlying cardiac lead wires and mildly radiopaque tags. A nasogastric tube is seen with its distal end extending into the body of the stomach. The distal side hole sits at the approximate level of the gastroesophageal junction. Mild atelectasis is seen within the left lung base. There is no evidence of a pleural effusion or pneumothorax. The heart size and mediastinal contours are within normal limits. Moderate severity calcification of the aortic arch is noted. The visualized skeletal structures are unremarkable. IMPRESSION: 1. Nasogastric tube with distal end in the body of the stomach. 2. Mild left basilar atelectasis. Electronically Signed   By: Aram Candela M.D.   On: 07/18/2021 01:31   DG Abdomen Acute W/Chest  Result Date: 07/17/2021 CLINICAL DATA:  Chest and abdominal pain, history of prior colectomy and decreased semi output EXAM: DG ABDOMEN ACUTE WITH 1 VIEW CHEST COMPARISON:  01/27/2021 CT, chest x-ray from 01/04/2021 FINDINGS: Cardiac shadow is stable. Aortic calcifications are noted. The lungs are hyperinflated but clear. Scattered large and small bowel gas is noted. Left-sided ostomy is noted new from the prior CT examination. No free air is seen. No obstructive changes are noted. No acute bony abnormality is seen. IMPRESSION: No acute abnormality in the chest and abdomen. Electronically Signed   By: Alcide Clever M.D.   On: 07/17/2021 23:06   DG Abd Portable 1V  Result Date: 07/19/2021 CLINICAL DATA:  Follow-up small bowel obstruction. EXAM: PORTABLE ABDOMEN - 1 VIEW COMPARISON:  July 18, 2021 FINDINGS: The nasogastric tube seen on the prior study has been removed. Persistently dilated small bowel is seen overlying the mid to lower abdomen. Air is seen throughout normal loops of large bowel. An ostomy site is again  seen overlying the mid to lower left abdomen. No radio-opaque calculi or other significant radiographic abnormality are seen. IMPRESSION: Persistent small bowel obstruction. Electronically Signed   By: Aram Candela M.D.   On: 07/19/2021 05:19   ECHOCARDIOGRAM COMPLETE  Result Date: 07/18/2021    ECHOCARDIOGRAM REPORT   Patient Name:   Brandi Rowe Date of Exam: 07/18/2021 Medical Rec #:  [id number]       Height:       63.0 in Accession #:    [id number]      Weight:       116.6 lb Date of Birth:  May 07, 1956       BSA:          1.538 m Patient Age:    65 years        BP:           132/75 mmHg Patient Gender: F               HR:           85 bpm. [facility] Procedure: 2D Echo, Cardiac Doppler and Color Doppler Indications:    Atrial Fibrillation  History:        Patient has no prior history of Echocardiogram examinations.                 Arrythmias:Atrial Fibrillation.  Sonographer:    Mikki Harbor Referring Phys: [id number] ASIA B ZIERLE-GHOSH IMPRESSIONS  1. Left ventricular ejection fraction, by estimation, is 50 to 55%. Left  ventricular ejection fraction by 2D MOD biplane is 53.5 %. The left ventricle has low normal function. The left ventricle has no regional wall motion abnormalities. Left ventricular diastolic parameters are consistent with Grade I diastolic dysfunction (impaired relaxation).  2. Right ventricular systolic function is normal. The right ventricular size is normal. There is mildly elevated pulmonary artery systolic pressure. The estimated right ventricular systolic pressure is 36.7 mmHg.  3. Right atrial size was moderately dilated.  4. The mitral valve is grossly normal. Trivial mitral valve regurgitation.  5. The aortic valve is tricuspid. Aortic valve regurgitation is not visualized.  6. The inferior vena cava is normal in size with <50% respiratory variability, suggesting right atrial pressure of 8 mmHg.  7. Rhythm strip during this exam demostrated normal sinus  rhythm. Comparison(s): No prior Echocardiogram. FINDINGS  Left Ventricle: Left ventricular ejection fraction, by estimation, is 50 to 55%. Left ventricular ejection fraction by 2D MOD biplane is 53.5 %. The left ventricle has low normal function. The left ventricle has no regional wall motion abnormalities. The left ventricular internal cavity size was normal in size. There is no left ventricular hypertrophy. Left ventricular diastolic parameters are consistent with Grade I diastolic dysfunction (impaired relaxation). Indeterminate filling pressures. Right Ventricle: The right ventricular size is normal. No increase in right ventricular wall thickness. Right ventricular systolic function is normal. There is mildly elevated pulmonary artery systolic pressure. The tricuspid regurgitant velocity is 2.68  m/s, and with an assumed right atrial pressure of 8 mmHg, the estimated right ventricular systolic pressure is 36.7 mmHg. Left Atrium: Left atrial size was normal in size. Right Atrium: Right atrial size was moderately dilated. Pericardium: There is no evidence of pericardial effusion. Mitral Valve: The mitral valve is grossly normal. Trivial mitral valve regurgitation. MV peak gradient, 2.1 mmHg. The mean mitral valve gradient is 1.0 mmHg. Tricuspid Valve: The tricuspid valve is grossly normal. Tricuspid valve regurgitation is mild. Aortic Valve: The aortic valve is tricuspid. Aortic valve regurgitation is not visualized. Aortic valve mean gradient measures 4.0 mmHg. Aortic valve peak gradient measures 7.4 mmHg. Aortic valve area, by VTI measures 2.44 cm. Pulmonic Valve: The pulmonic valve was grossly normal. Pulmonic valve regurgitation is trivial. Aorta: The aortic root and ascending aorta are structurally normal, with no evidence of dilitation. Venous: The inferior vena cava is normal in size with less than 50% respiratory variability, suggesting right atrial pressure of 8 mmHg. IAS/Shunts: No atrial level shunt  detected by color flow Doppler. EKG: Rhythm strip during this exam demostrated normal sinus rhythm.  LEFT VENTRICLE PLAX 2D                        Biplane EF (MOD) LVIDd:         4.10 cm         LV Biplane EF:   Left LVIDs:         3.00 cm                          ventricular LV PW:         0.90 cm                          ejection LV IVS:        0.60 cm  fraction by LVOT diam:     1.80 cm                          2D MOD LV SV:         64                               biplane is LV SV Index:   41                               53.5 %. LVOT Area:     2.54 cm                                Diastology                                LV e' medial:    6.89 cm/s LV Volumes (MOD)               LV E/e' medial:  8.2 LV vol d, MOD    34.7 ml       LV e' lateral:   7.78 cm/s A2C:                           LV E/e' lateral: 7.3 LV vol d, MOD    35.3 ml A4C: LV vol s, MOD    15.0 ml A2C: LV vol s, MOD    17.2 ml A4C: LV SV MOD A2C:   19.7 ml LV SV MOD A4C:   35.3 ml LV SV MOD BP:    18.6 ml RIGHT VENTRICLE RV Basal diam:  3.30 cm RV Mid diam:    2.40 cm RV S prime:     11.30 cm/s TAPSE (M-mode): 2.3 cm LEFT ATRIUM             Index        RIGHT ATRIUM           Index LA diam:        3.40 cm 2.21 cm/m   RA Area:     20.30 cm LA Vol (A2C):   43.8 ml 28.49 ml/m  RA Volume:   68.60 ml  44.62 ml/m LA Vol (A4C):   39.2 ml 25.50 ml/m LA Biplane Vol: 42.7 ml 27.77 ml/m  AORTIC VALVE                    PULMONIC VALVE AV Area (Vmax):    2.13 cm     PV Vmax:       0.88 m/s AV Area (Vmean):   2.02 cm     PV Peak grad:  3.1 mmHg AV Area (VTI):     2.44 cm AV Vmax:           136.00 cm/s AV Vmean:          86.300 cm/s AV VTI:            0.261 m AV Peak Grad:      7.4 mmHg AV Mean Grad:      4.0 mmHg LVOT Vmax:         114.00 cm/s LVOT Vmean:  68.400 cm/s LVOT VTI:          0.250 m LVOT/AV VTI ratio: 0.96  AORTA Ao Root diam: 3.20 cm Ao Asc diam:  3.10 cm MITRAL VALVE               TRICUSPID VALVE MV Area  (PHT): 3.03 cm    TR Peak grad:   28.7 mmHg MV Area VTI:   4.18 cm    TR Vmax:        268.00 cm/s MV Peak grad:  2.1 mmHg MV Mean grad:  1.0 mmHg    SHUNTS MV Vmax:       0.72 m/s    Systemic VTI:  0.25 m MV Vmean:      44.6 cm/s   Systemic Diam: 1.80 cm MV Decel Time: 250 msec MV E velocity: 56.80 cm/s MV A velocity: 71.40 cm/s MV E/A ratio:  0.80 Zoila Shutter MD Electronically signed by Zoila Shutter MD Signature Date/Time: 07/18/2021/10:43:35 AM    Final     Anti-infectives: Anti-infectives (From admission, onward)    Start     Dose/Rate Route Frequency Ordered Stop   07/18/21 0800  piperacillin-tazobactam (ZOSYN) IVPB 3.375 g        3.375 g 12.5 mL/hr over 240 Minutes Intravenous Every 8 hours 07/18/21 0423     07/17/21 2330  piperacillin-tazobactam (ZOSYN) IVPB 3.375 g        3.375 g 100 mL/hr over 30 Minutes Intravenous  Once 07/17/21 2316 07/18/21 0044       Assessment/Plan: Impression: Partial small bowel obstruction, resolved.  No need for acute surgical intervention at this time.  Will advance to heart healthy diet.  Okay for discharge from surgery standpoint.  She should follow-up with me as needed.  LOS: 1 day    Franky Macho 07/19/2021

## 2021-07-19 NOTE — Care Management Important Message (Signed)
Important Message  Patient Details  Name: Brandi Rowe MRN: [id number] Date of Birth: 02/21/1956   Medicare Important Message Given:  Yes     Villa Herb, LCSWA 07/19/2021, 11:36 AM

## 2021-07-20 ENCOUNTER — Telehealth: Payer: Self-pay

## 2021-07-20 NOTE — Telephone Encounter (Signed)
Transition Care Management Follow-up Telephone Call Date of discharge and from where: 07/19/21 [facility] Diagnosis: Small bowel obstruction, s/p surgical repair. How have you been since you were released from the hospital? Pt states she is doing okay. States she may have "overdone" it yesterday. Encouraged pt to rest, push fluids and eat light, non greasy foods but to call Dr. Lovell Sheehan office if sx become worse. Pt verbalized understanding of all.  Any questions or concerns? Yes  Items Reviewed: Did the pt receive and understand the discharge instructions provided? Yes  Medications obtained and verified? Yes  Other? No  Any new allergies since your discharge? No  Dietary orders reviewed? Yes Do you have support at home? Yes   Home Care and Equipment/Supplies: Were home health services ordered? no If so, what is the name of the agency? N/A  Has the agency set up a time to come to the patient's home? no Were any new equipment or medical supplies ordered?  No What is the name of the medical supply agency? N/A Were you able to get the supplies/equipment? not applicable Do you have any questions related to the use of the equipment or supplies? No  Functional Questionnaire: (I = Independent and D = Dependent) ADLs: I  Bathing/Dressing- I  Meal Prep- I  Eating- I  Maintaining continence- I  Transferring/Ambulation- I  Managing Meds- I  Follow up appointments reviewed:  PCP Hospital f/u appt confirmed? Yes  Scheduled to see Cathlean Marseilles, NP on 07/27/2020 @ 3:30pm. Specialist Hospital f/u appt confirmed? No  Dr. Blanchie Dessert office has not called pt yet to schedule follow up. Are transportation arrangements needed? No  If their condition worsens, is the pt aware to call PCP or go to the Emergency Dept.? Yes Was the patient provided with contact information for the PCP's office or ED? Yes Was to pt encouraged to call back with questions or concerns? Yes

## 2021-07-20 NOTE — Telephone Encounter (Signed)
Transition Care Management Unsuccessful Follow-up Telephone Call  Date of discharge and from where:  07/19/21 [facility]  Diagnosis: Small bowel obstruction, s/p surgical correction.   Attempts:  1st Attempt  Reason for unsuccessful TCM follow-up call:  Left voice message

## 2021-07-23 LAB — CULTURE, BLOOD (ROUTINE X 2)
Culture: NO GROWTH
Culture: NO GROWTH

## 2021-07-26 ENCOUNTER — Other Ambulatory Visit: Payer: Self-pay

## 2021-07-26 ENCOUNTER — Encounter: Payer: Self-pay | Admitting: Urology

## 2021-07-26 ENCOUNTER — Ambulatory Visit (INDEPENDENT_AMBULATORY_CARE_PROVIDER_SITE_OTHER): Payer: Medicare Other | Admitting: Urology

## 2021-07-26 VITALS — BP 156/87 | HR 67

## 2021-07-26 DIAGNOSIS — R309 Painful micturition, unspecified: Secondary | ICD-10-CM

## 2021-07-26 DIAGNOSIS — N321 Vesicointestinal fistula: Secondary | ICD-10-CM | POA: Diagnosis not present

## 2021-07-26 DIAGNOSIS — R350 Frequency of micturition: Secondary | ICD-10-CM

## 2021-07-26 LAB — URINALYSIS, ROUTINE W REFLEX MICROSCOPIC
Bilirubin, UA: NEGATIVE
Glucose, UA: NEGATIVE
Ketones, UA: NEGATIVE
Nitrite, UA: NEGATIVE
Specific Gravity, UA: 1.025 (ref 1.005–1.030)
Urobilinogen, Ur: 0.2 mg/dL (ref 0.2–1.0)
pH, UA: 5.5 (ref 5.0–7.5)

## 2021-07-26 NOTE — Progress Notes (Signed)
07/26/2021 10:28 AM   Brandi Rowe 1956/01/18 [id number]  Referring provider: Eulogio Bear, NP [phone] [facility] [address],  [place] [address]  No chief complaint on file. Recheck following colovesical fistula repair 10/22  HPI: 07/26/21 Pt is a 66YO presenting for routine fu post colovesical fistula repair on 05/17/21. She states doing well except for episode of severe RLQ abdominal pain on 12/26 which she had tx at the ED. She was dx with small bowel obstruction and has been seen in fu for this. Sx have resolved somewhat since. CT did not show renal or bladder pathology. Pt denies dysuria, frequency, hematuria. Pt never took mirabegron due to new dx of hypertension. She has samples still.  UA (-) today  06/02/21 [name] is a [id number] here for followup after colovesical fistular repair. Urine is clear. She denies any worsening pelvic pain. Voiding trial passed today    PMH: Past Medical History:  Diagnosis Date   Acute febrile illness 01/04/2021   Anxiety    Colovaginal fistula    Colovesical fistula    a. 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   Dehydration 01/04/2021   Elevated MCV 01/04/2021   Gout    Nausea 01/04/2021   PAF (paroxysmal atrial fibrillation) ([place])    a. 06/2021 in setting of SBO, HypoK/Mg; b. 06/2021 Echo: EF 50-55%, no rwma, GrI DD, Nl RV size/fxn, RVSP 36.63mmHg. Mod dil RA. Triv MR.   Sepsis ([place]) 01/05/2021   Tobacco abuse     Surgical History: Past Surgical History:  Procedure Laterality Date   BLADDER REPAIR N/A 05/17/2021   Procedure: Closure of Cystotomy;  Surgeon: Cleon Gustin, MD;  Location: [facility];  Service: Urology;  Laterality: N/A;   CARPAL TUNNEL RELEASE Right 2002   MANDIBLE SURGERY  1985   PARTIAL COLECTOMY N/A 05/17/2021   Procedure: PARTIAL COLECTOMY WITH COLOSTOMY;  Surgeon: Aviva Signs, MD;  Location: [facility];  Service: General;  Laterality: N/A;   PELVIC LYMPH NODE DISSECTION N/A 05/17/2021   Procedure:  PELVIC DISSECTION;  Surgeon: Florian Buff, MD;  Location: [facility];  Service: Gynecology;  Laterality: N/A;    Home Medications:  Allergies as of 07/26/2021       Reactions   Grass Extracts [gramineae Pollens]    Dust, mold, mites        Medication List        Accurate as of July 26, 2021 10:28 AM. If you have any questions, ask your nurse or doctor.          acetaminophen 500 MG tablet Commonly known as: TYLENOL Take 500 mg by mouth 2 (two) times daily as needed for moderate pain or headache.   aspirin EC 81 MG tablet Take 81 mg by mouth daily.   calcium carbonate 500 MG chewable tablet Commonly known as: TUMS - dosed in mg elemental calcium Chew 1 tablet by mouth daily as needed for indigestion or heartburn.   clonazePAM 1 MG tablet Commonly known as: KLONOPIN Take 1 tablet (1 mg total) by mouth daily as needed for anxiety.   diphenhydrAMINE 25 MG tablet Commonly known as: BENADRYL Take 25 mg by mouth at bedtime as needed for allergies.   ferrous sulfate 325 (65 FE) MG EC tablet Take 1 tablet (325 mg total) by mouth 2 (two) times daily. What changed: when to take this   folic acid A999333 MCG tablet Commonly known as: FOLVITE Take 400 mcg by mouth every other day.  metoprolol tartrate 25 MG tablet Commonly known as: LOPRESSOR Take 1 tablet (25 mg total) by mouth 2 (two) times daily.   REFRESH OP Place 1 drop into both eyes daily as needed (dry eyes).   vitamin B-12 1000 MCG tablet Commonly known as: CYANOCOBALAMIN Take 1 tablet (1,000 mcg total) by mouth daily. What changed: when to take this        Allergies:  Allergies  Allergen Reactions   Grass Extracts [Gramineae Pollens]     Dust, mold, mites    Family History: Family History  Problem Relation Age of Onset   Congestive Heart Failure Mother    Uterine cancer Mother    Pancreatic cancer Father    Atrial fibrillation Brother    Stroke Maternal Grandmother    Heart disease Maternal  Grandmother    Glaucoma Maternal Grandfather     Social History:  reports that she has been smoking cigarettes. She has a 23.00 pack-year smoking history. She has never used smokeless tobacco. She reports current alcohol use of about 7.0 standard drinks per week. She reports that she does not use drugs.  ROS: All other review of systems were reviewed and are negative except what is noted above in HPI  Physical Exam: BP (!) 156/87    Pulse 67   Constitutional:  Alert and oriented, No acute distress. HEENT: [place] AT, trachea midline Cardiovascular: No clubbing, cyanosis, or edema. Respiratory: Normal respiratory effort, no increased work of breathing. GI: Abdomen is non distended GU: No CVA tenderness.  Skin: No rashes, bruises or suspicious lesions. Neurologic: Grossly intact, no focal deficits, moving all 4 extremities. Psychiatric: Normal mood and affect.  Laboratory Data: Lab Results  Component Value Date   WBC 7.6 07/18/2021   HGB 12.1 07/18/2021   HCT 36.2 07/18/2021   MCV 97.1 07/18/2021   PLT 166 07/18/2021    Lab Results  Component Value Date   CREATININE 0.75 07/19/2021    No results found for: PSA  No results found for: TESTOSTERONE  Lab Results  Component Value Date   HGBA1C 5.4 09/24/2018    Urinalysis    Component Value Date/Time   COLORURINE YELLOW 01/04/2021 1424   APPEARANCEUR Hazy (A) 03/01/2021 1415   LABSPEC 1.014 01/04/2021 1424   PHURINE 5.0 01/04/2021 1424   GLUCOSEU Negative 03/01/2021 1415   HGBUR LARGE (A) 01/04/2021 1424   BILIRUBINUR Negative 03/01/2021 1415   KETONESUR 20 (A) 01/04/2021 1424   PROTEINUR 2+ (A) 03/01/2021 1415   PROTEINUR 30 (A) 01/04/2021 1424   NITRITE Negative 03/01/2021 1415   NITRITE NEGATIVE 01/04/2021 1424   LEUKOCYTESUR 1+ (A) 03/01/2021 1415   LEUKOCYTESUR LARGE (A) 01/04/2021 1424    Lab Results  Component Value Date   LABMICR See below: 03/01/2021   WBCUA 11-30 (A) 03/01/2021   LABEPIT 0-10  03/01/2021   MUCUS Present 03/01/2021   BACTERIA Few 03/01/2021    Pertinent Imaging: Results for orders placed during the hospital encounter of 07/17/21  DG Abd 1 View  Narrative CLINICAL DATA:  Abdominal pain  EXAM: ABDOMEN - 1 VIEW  COMPARISON:  CT abdomen and pelvis 07/17/2021  FINDINGS: A few abnormally distended loops of small bowel identified in the mid abdomen measuring up to 3.5 cm in diameter. Left-sided ostomy. No suspicious calcifications identified. Enteric tube tip in the stomach.  IMPRESSION: Abnormal bowel gas pattern consistent with the obstruction seen on CT. Continued follow-up recommended.   Electronically Signed By: Ofilia Neas M.D. On: 07/18/2021 09:23  No results found for this or any previous visit.  No results found for this or any previous visit.  No results found for this or any previous visit.  No results found for this or any previous visit.  No results found for this or any previous visit.  No results found for this or any previous visit.  Results for orders placed during the hospital encounter of 01/04/21  CT Renal Stone Study  Narrative CLINICAL DATA:  66 year old with left abdominal pain and hematuria.  EXAM: CT ABDOMEN AND PELVIS WITHOUT CONTRAST  TECHNIQUE: Multidetector CT imaging of the abdomen and pelvis was performed following the standard protocol without IV contrast.  COMPARISON:  None.  FINDINGS: Lower chest: Emphysematous changes at the lung bases. No pleural effusions.  Hepatobiliary: Normal appearance of the liver and gallbladder.  Pancreas: Unremarkable. No pancreatic ductal dilatation or surrounding inflammatory changes.  Spleen: Normal in size without focal abnormality.  Adrenals/Urinary Tract: No gross abnormality to the adrenal glands. Negative for kidney stones or hydronephrosis. There is gas in the urinary bladder.  Stomach/Bowel: Long segment of the sigmoid colon is  markedly abnormal and appears to have diffuse wall thickening but poorly characterized without IV or oral contrast. This segment measures roughly 9.6 cm in length. Lumen of this abnormal sigmoid colon appears to markedly narrowed. Prominent mesocolon lymph nodes around the sigmoid colon. Few diverticula associated with this abnormal sigmoid colon. Moderate amount of stool in the rectum distal to the sigmoid colon thickening. Diverticula involving the left colon. Normal appearance of the stomach. No significant small bowel dilatation.  Vascular/Lymphatic: Atherosclerotic calcifications in the aorta without aneurysm. Slightly prominent lymph nodes in the sigmoid mesocolon region.  Reproductive: Uterus appears to be present but very poorly characterized on this exam without intravenous contrast. There is a left adnexal mass that contains a focal fluid collection and small pockets of gas. The left adnexal fluid collection does not contain gas and measures 3.5 x 2.5 cm on sequence 2, image 54. The left adnexal mass appears to be contiguous with an extraluminal collection containing gas in the central aspect of the lower abdomen on sequence 2 image 49. This extraluminal collection containing gas roughly measures 3.3 x 2.3 cm. No gross abnormality to the right adnexal region.  Other: Abnormal soft tissue centered around the sigmoid colon, top of the urinary bladder and uterine region. No significant ascites.  Musculoskeletal: No acute bone abnormality.  IMPRESSION: 1. Complex inflammatory process in the pelvis centered around the sigmoid colon. Long segment of sigmoid colon appears to be diffusely abnormal and thickened. Abnormal appearance of the left adnexa containing a focal fluid collection and small pockets of gas. In addition, there is an irregular extraluminal collection in the central lower abdomen that contains gas and appears to be associated with the left adnexal  lesion/inflammatory process. There is also abnormal soft tissue along the bladder dome and there is gas within the bladder. Findings are suggestive for an underlying bowel perforation originating from the sigmoid colon with abscesses and fistula connections to the left adnexa and potentially the urinary bladder. Etiology could be secondary to diverticulitis but cannot exclude a colonic neoplasm. Recommend further characterization with a CT of the abdomen and pelvis with IV contrast. 2. Aortic Atherosclerosis (ICD10-I70.0) and Emphysema (ICD10-J43.9).  These results were called by telephone at the time of interpretation on 01/04/2021 at 3:59 pm to provider [facility] , who verbally acknowledged these results.   Electronically Signed By: Markus Daft M.D. On:  01/04/2021 16:05   Assessment & Plan:    1. Colovesical fistula Take mirabegron only if needed for spasm. FU in 3 months. - Urinalysis, Routine w reflex microscopic Keep FU with surgeons for small bowel obstruction  No follow-ups on file.  [place], Berneice Heinrich, PA-C  [facility] [place]

## 2021-07-26 NOTE — Progress Notes (Signed)
Urological Symptom Review  Patient is experiencing the following symptoms: Frequent urination Burning/pain with urination   Review of Systems  Gastrointestinal (upper)  : Negative for upper GI symptoms  Gastrointestinal (lower) : Negative for lower GI symptoms  Constitutional : Negative for symptoms  Skin: Negative for skin symptoms  Eyes: Blurred vision  Ear/Nose/Throat : Sinus problems  Hematologic/Lymphatic: Negative for Hematologic/Lymphatic symptoms  Cardiovascular : Negative for cardiovascular symptoms  Respiratory : Negative for respiratory symptoms  Endocrine: Negative for endocrine symptoms  Musculoskeletal: Negative for musculoskeletal symptoms  Neurological: Negative for neurological symptoms  Psychologic: Negative for psychiatric symptoms

## 2021-07-27 ENCOUNTER — Encounter: Payer: Self-pay | Admitting: Nurse Practitioner

## 2021-07-27 ENCOUNTER — Ambulatory Visit (INDEPENDENT_AMBULATORY_CARE_PROVIDER_SITE_OTHER): Payer: Medicare Other | Admitting: Nurse Practitioner

## 2021-07-27 VITALS — BP 162/90 | HR 64 | Ht 64.0 in | Wt 112.0 lb

## 2021-07-27 DIAGNOSIS — F410 Panic disorder [episodic paroxysmal anxiety] without agoraphobia: Secondary | ICD-10-CM

## 2021-07-27 DIAGNOSIS — I4891 Unspecified atrial fibrillation: Secondary | ICD-10-CM

## 2021-07-27 DIAGNOSIS — H6982 Other specified disorders of Eustachian tube, left ear: Secondary | ICD-10-CM | POA: Diagnosis not present

## 2021-07-27 MED ORDER — CLONAZEPAM 1 MG PO TABS: 1.0000 mg | ORAL_TABLET | Freq: Every day | ORAL | 0 refills | Status: AC | PRN

## 2021-07-27 NOTE — Progress Notes (Signed)
Subjective:    Patient ID: Brandi Rowe Kokesh, female    DOB: 27-Jun-1956, 66 y.o.   MRN: [id number]  HPI: Brandi Rowe [place] is a 66 y.o. female presenting for hospital follow up.  Chief Complaint  Patient presents with   Hospitalization Follow-up   HOSPITAL FOLLOW UP Time since discharge: 8 days  Hospital/facility:  [facility]  Diagnosis: small bowel obstruction, new onset atrial fibrillation, hypokalemia  CT abdomen/pelvis - small bowel obstruction with a transition zone seen within the right lower quadrant, postop changes consistent with partial colectomy and LLQ ostomy site, small hiatal hernia, aortic atherosclerosis  Echocardiogram - LV EF 50-55%  Consultants: Cardiology, general surgery  New medications: metoprolol 25 mg twice daily   Discharge instructions:  take medications, follow up with PCP and Cardiology.   Status: stable  Patient reports she was not given a Zio monitor.  She has been taking metoprolol 25 mg once daily.  She reports palpitations have improved since discharge and starting on the mediation.  She has Cardiology appointment in February.    She sees general surgeon next week to discuss possible colostomy revision.  She is hopeful.  Regarding anxiety, she does not think a half tablet of the clonazepam 1 mg is working as well as diazepam 2.5 mg.  Feels crackling in left ear.  No fevers, pressure, sinus pain. Denies drainage from left ear.   Allergies  Allergen Reactions   Grass Extracts [Gramineae Pollens]     Dust, mold, mites    Outpatient Encounter Medications as of 07/27/2021  Medication Sig   acetaminophen (TYLENOL) 500 MG tablet Take 500 mg by mouth 2 (two) times daily as needed for moderate pain or headache.   aspirin EC 81 MG tablet Take 81 mg by mouth daily.   calcium carbonate (TUMS - DOSED IN MG ELEMENTAL CALCIUM) 500 MG chewable tablet Chew 1 tablet by mouth daily as needed for indigestion or heartburn.   diphenhydrAMINE  (BENADRYL) 25 MG tablet Take 25 mg by mouth at bedtime as needed for allergies.   ferrous sulfate 325 (65 FE) MG EC tablet Take 1 tablet (325 mg total) by mouth 2 (two) times daily. (Patient taking differently: Take 325 mg by mouth every other day.)   folic acid (FOLVITE) 400 MCG tablet Take 400 mcg by mouth every other day.   metoprolol tartrate (LOPRESSOR) 25 MG tablet Take 1 tablet (25 mg total) by mouth 2 (two) times daily.   Polyvinyl Alcohol-Povidone (REFRESH OP) Place 1 drop into both eyes daily as needed (dry eyes).   vitamin B-12 (CYANOCOBALAMIN) 1000 MCG tablet Take 1 tablet (1,000 mcg total) by mouth daily. (Patient taking differently: Take 1,000 mcg by mouth every other day.)   [DISCONTINUED] clonazePAM (KLONOPIN) 1 MG tablet Take 1 tablet (1 mg total) by mouth daily as needed for anxiety.   [DISCONTINUED] diazepam (VALIUM) 5 MG tablet Take 2.5 mg by mouth.   clonazePAM (KLONOPIN) 1 MG tablet Take 1 tablet (1 mg total) by mouth daily as needed for anxiety.   No facility-administered encounter medications on file as of 07/27/2021.    Patient Active Problem List   Diagnosis Date Noted   SBO (small bowel obstruction) ([facility]) 07/18/2021   New onset atrial fibrillation ([facility]) 07/18/2021   Hypokalemia 07/18/2021   Non-seasonal allergic rhinitis 06/08/2021   Other specified anemias 06/08/2021   Elevated blood pressure reading without diagnosis of hypertension 06/08/2021   Status post Gertie GowdaHartmann procedure [facility](HCC) 05/17/2021   Colovesical fistula  Diverticulitis of colon 01/04/2021   Acute diarrhea 01/04/2021   Abdominal pain 01/04/2021   Gout 05/09/2020   Polycythemia 09/25/2018   Tobacco use disorder 09/25/2018   Anxiety disorder, unspecified 09/24/2018    Past Medical History:  Diagnosis Date   Acute febrile illness 01/04/2021   Anxiety    Colovaginal fistula    Colovesical fistula    a. 04/2021 Rowe/p partial colectomy w/ colostomy.   Dehydration 01/04/2021   Elevated MCV  01/04/2021   Gout    Nausea 01/04/2021   PAF (paroxysmal atrial fibrillation) ([facility])    a. 06/2021 in setting of SBO, HypoK/Mg; b. 06/2021 Echo: EF 50-55%, no rwma, GrI DD, Nl RV size/fxn, RVSP 36.8mmHg. Mod dil RA. Triv MR.   Sepsis ([facility]) 01/05/2021   Tobacco abuse     Relevant past medical, surgical, family and social history reviewed and updated as indicated. Interim medical history since our last visit reviewed.  Review of Systems Per HPI unless specifically indicated above     Objective:    BP (!) 162/90    Pulse 64    Ht 5\' 4"  (1.626 m)    Wt 112 lb (50.8 kg)    SpO2 100%    BMI 19.22 kg/m   Wt Readings from Last 3 Encounters:  07/27/21 112 lb (50.8 kg)  07/18/21 116 lb 10 oz (52.9 kg)  06/08/21 110 lb 6.4 oz (50.1 kg)    Physical Exam Vitals and nursing note reviewed.  Constitutional:      General: She is not in acute distress.    Appearance: Normal appearance. She is not toxic-appearing.  HENT:     Right Ear: Hearing, tympanic membrane, ear canal and external ear normal.     Left Ear: Hearing normal.     Ears:     Comments: Small amount cerumen left ear - not impacted Cardiovascular:     Rate and Rhythm: Normal rate and regular rhythm.     Heart sounds: Normal heart sounds. No murmur heard. Pulmonary:     Effort: Pulmonary effort is normal. No respiratory distress.     Breath sounds: Normal breath sounds. No wheezing, rhonchi or rales.  Skin:    General: Skin is warm and dry.     Coloration: Skin is not jaundiced or pale.     Findings: No erythema.  Neurological:     Mental Status: She is alert and oriented to person, place, and time.     Motor: No weakness.     Gait: Gait normal.  Psychiatric:        Mood and Affect: Mood normal.        Behavior: Behavior normal.        Thought Content: Thought content normal.        Judgment: Judgment normal.      Assessment & Plan:   Problem List Items Addressed This Visit       Cardiovascular and Mediastinum    New onset atrial fibrillation [facility]) - Primary    Patient has follow up scheduled with Cardiology for early next month, continue collaboration.  We are holding off on anticoagulation for now as she may have upcoming surgery.  She denies sensation feeling like her heart is racing since discharge from hospital.  I encouraged her to take metoprolol 25 mg twice daily.  Her heart rhythm sounds regular today.          Other   Anxiety disorder, unspecified    Chronic.  We discussed the  use of benzodiazepines at length today.  She should no longer be taking diazepam.  She will start trying clonazepam 1 mg whole tablet as needed.  She is not using more than 2 times weekly and this is appropriate.  She is aware that if she starts taking this medication more than normal, she should contact us for an appointment to discuss a daily medication that is not habit forming.  I reviewed PDMP and it is appropriate - I gave refill for clonazepam 1 mg today and this should last at least 6 months.        Relevant Medications   clonazePAM (KLONOPIN) 1 MG tablet   Other Visit Diagnoses     Eustachian tube dysfunction, left       Acute.  Likely eustachian tube dysfunction. Small amount cerumen, we flushed ear with good response.  Continue nasal corticosteroid.  Follow up if continues.        Follow up plan: Return if symptoms worsen or fail to improve.

## 2021-07-28 ENCOUNTER — Encounter: Payer: Self-pay | Admitting: Nurse Practitioner

## 2021-07-28 NOTE — Assessment & Plan Note (Signed)
Patient has follow up scheduled with Cardiology for early next month, continue collaboration.  We are holding off on anticoagulation for now as she may have upcoming surgery.  She denies sensation feeling like her heart is racing since discharge from hospital.  I encouraged her to take metoprolol 25 mg twice daily.  Her heart rhythm sounds regular today.

## 2021-07-28 NOTE — Assessment & Plan Note (Signed)
Chronic.  We discussed the use of benzodiazepines at length today.  She should no longer be taking diazepam.  She will start trying clonazepam 1 mg whole tablet as needed.  She is not using more than 2 times weekly and this is appropriate.  She is aware that if she starts taking this medication more than normal, she should contact us for an appointment to discuss a daily medication that is not habit forming.  I reviewed PDMP and it is appropriate - I gave refill for clonazepam 1 mg today and this should last at least 6 months.

## 2021-08-01 ENCOUNTER — Ambulatory Visit (INDEPENDENT_AMBULATORY_CARE_PROVIDER_SITE_OTHER): Payer: Medicare Other | Admitting: General Surgery

## 2021-08-01 ENCOUNTER — Other Ambulatory Visit: Payer: Self-pay

## 2021-08-01 ENCOUNTER — Encounter: Payer: Self-pay | Admitting: General Surgery

## 2021-08-01 VITALS — BP 165/97 | HR 70 | Temp 97.6°F | Resp 14 | Ht 64.0 in | Wt 111.0 lb

## 2021-08-01 DIAGNOSIS — Z933 Colostomy status: Secondary | ICD-10-CM

## 2021-08-01 NOTE — Progress Notes (Signed)
Subjective:     Brandi Rowe  Patient here for follow-up of hospitalization for a partial small bowel obstruction.  This did resolve without needing surgery.  Interestingly, she was diagnosed with new onset atrial fibrillation.  She is to see cardiology next month for follow-up.  She states she is doing well.  She has no nausea or vomiting.  She has no abdominal distention or discomfort.  Her colostomy is working fine. Objective:    BP (!) 165/97    Pulse 70    Temp 97.6 F (36.4 C) (Other (Comment))    Resp 14    Ht 5\' 4"  (1.626 m)    Wt 111 lb (50.3 kg)    SpO2 98%    BMI 19.05 kg/m   General:  alert, cooperative, and no distress  Abdomen soft, colostomy pink and patent.  Midline incision is well-healed.  Bowel sounds are present. Heart examination reveals a irregular rhythm but normal rate.     Assessment:    No further episodes of bowel obstruction.  New onset atrial fibrillation Plan:   I told the patient that we will need cardiac clearance and control of her atrial fibrillation prior to reversal of her colostomy.  She will call my office after she has been seen by cardiology.

## 2021-08-09 ENCOUNTER — Ambulatory Visit: Payer: Medicare Other | Admitting: Nurse Practitioner

## 2021-08-21 NOTE — Progress Notes (Signed)
Cardiology Office Note    Date:  08/28/2021   ID:  Brandi Rowe, DOB 03-04-1956, MRN [id number]   PCP:  Brandi Squibb, MD   Brandi Rowe  Cardiologist:  Brandi Casino, MD   Advanced Practice Provider:  No care team member to display Electrophysiologist:  None   [id number]   Chief Complaint  Patient presents with   Hospitalization Follow-up    History of Present Illness:  Brandi Rowe is a 66 y.o. 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 2 week ZIO ordered but she never wore. No anticoagulation given because of brief episode in setting of SBP and possible need for surgery.    Patient comes in for f/u. She never got the monitor. She denies any palpitations, chest pain. She didn't feel Afib in ER b/c she was vomiting and felt so bad. Has chronic DOE that's unchanged. Walks around her home but not doing too much b/c of ostomy. Smokes 5-7 cigarettes daily. Hoping to have reversal soon by Dr. Aviva Rowe.   Past Medical History:  Diagnosis Date   Acute febrile illness 01/04/2021   Anxiety    Colovaginal fistula    Colovesical fistula    a. 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   Dehydration 01/04/2021   Elevated MCV 01/04/2021   Gout    Nausea 01/04/2021   PAF (paroxysmal atrial fibrillation) ([place])    a. 06/2021 in setting of SBO, HypoK/Mg; b. 06/2021 Echo: EF 50-55%, no rwma, GrI DD, Nl RV size/fxn, RVSP 36.71mmHg. Mod dil RA. Triv MR.   Sepsis ([place]) 01/05/2021   Tobacco abuse     Past Surgical History:  Procedure Laterality Date   BLADDER REPAIR N/A 05/17/2021   Procedure: Closure of Cystotomy;  Surgeon: Brandi Gustin, MD;  Location: [facility];  Service: Urology;  Laterality: N/A;   CARPAL TUNNEL RELEASE Right 2002   MANDIBLE SURGERY  1985   PARTIAL COLECTOMY N/A  05/17/2021   Procedure: PARTIAL COLECTOMY WITH COLOSTOMY;  Surgeon: Brandi Signs, MD;  Location: [facility];  Service: General;  Laterality: N/A;   PELVIC LYMPH NODE DISSECTION N/A 05/17/2021   Procedure: PELVIC DISSECTION;  Surgeon: Florian Buff, MD;  Location: [facility];  Service: Gynecology;  Laterality: N/A;    Current Medications: Current Meds  Medication Sig   acetaminophen (TYLENOL) 500 MG tablet Take 500 mg by mouth 2 (two) times daily as needed for moderate pain or headache.   aspirin EC 81 MG tablet Take 81 mg by mouth daily.   calcium carbonate (TUMS - DOSED IN MG ELEMENTAL CALCIUM) 500 MG chewable tablet Chew 1 tablet by mouth daily as needed for indigestion or heartburn.   clonazePAM (KLONOPIN) 1 MG tablet Take 1 tablet (1 mg total) by mouth daily as needed for anxiety.   diphenhydrAMINE (BENADRYL) 25 MG tablet Take 25 mg by mouth at bedtime as needed for allergies.   ferrous sulfate 325 (65 FE) MG EC tablet Take 1 tablet (325 mg total) by mouth 2 (two) times daily. (Patient taking differently: Take 325 mg by mouth every other day.)   folic acid (FOLVITE) A999333 MCG tablet Take 400 mcg by mouth every other day.   metoprolol tartrate (LOPRESSOR) 25 MG tablet Take 1 tablet (25 mg total) by mouth 2 (two) times daily.   Polyvinyl Alcohol-Povidone (  REFRESH OP) Place 1 drop into both eyes daily as needed (dry eyes).   vitamin B-12 (CYANOCOBALAMIN) 1000 MCG tablet Take 1 tablet (1,000 mcg total) by mouth daily. (Patient taking differently: Take 1,000 mcg by mouth every other day.)     Allergies:   Grass extracts [gramineae pollens]   Social History   Socioeconomic History   Marital status: Divorced    Spouse name: Not on file   Number of children: 0   Years of education: Not on file   Highest education level: Some college, no degree  Occupational History    Employer: [employer]  Tobacco Use   Smoking status: Every Day    Packs/day: 0.50    Years: 46.00    Pack years: 23.00     Types: Cigarettes   Smokeless tobacco: Never  Vaping Use   Vaping Use: Never used  Substance and Sexual Activity   Alcohol use: Not Currently    Alcohol/week: 7.0 standard drinks    Types: 7 Glasses of wine per week   Drug use: Never   Sexual activity: Not Currently    Partners: Male  Other Topics Concern   Not on file  Social History Narrative   Lives locally with husband.  Does not routinely exercise.   Social Determinants of Health   Financial Resource Strain: Not on file  Food Insecurity: Not on file  Transportation Needs: Not on file  Physical Activity: Not on file  Stress: Not on file  Social Connections: Not on file     Family History:  The patient's  family history includes Atrial fibrillation in her brother; Congestive Heart Failure in her mother; Glaucoma in her maternal grandfather; Heart disease in her maternal grandmother; Pancreatic cancer in her father; Stroke in her maternal grandmother; Uterine cancer in her mother.   ROS:   Please see the history of present illness.    ROS All other systems reviewed and are negative.   PHYSICAL EXAM:   VS:  BP 136/80    Pulse 65    Ht 5' 3.5" (1.613 m)    Wt 109 lb 9.6 oz (49.7 kg)    SpO2 96%    BMI 19.11 kg/m   Physical Exam  GEN: thin, in no acute distress  Neck: no JVD, carotid bruits, or masses Cardiac:RRR; no murmurs, rubs, or gallops  Respiratory:  decreased breath sounds but clear to auscultation bilaterally, normal work of breathing GI: soft, nontender, nondistended, + BS Ext: without cyanosis, clubbing, or edema, Good distal pulses bilaterally Neuro:  Alert and Oriented x 3 Psych: euthymic mood, full affect  Wt Readings from Last 3 Encounters:  08/28/21 109 lb 9.6 oz (49.7 kg)  08/01/21 111 lb (50.3 kg)  07/27/21 112 lb (50.8 kg)      Studies/Labs Reviewed:   EKG:  EKG is not ordered today.     Recent Labs: 07/18/2021: ALT 13; Hemoglobin 12.1; Platelets 166; TSH 1.201 07/19/2021: BUN 9;  Creatinine, Ser 0.75; Magnesium 2.4; Potassium 3.9; Sodium 135   Lipid Panel    Component Value Date/Time   CHOL 201 (H) 07/18/2021 0215   TRIG 206 (H) 07/18/2021 0215   HDL 78 07/18/2021 0215   CHOLHDL 2.6 07/18/2021 0215   VLDL 41 (H) 07/18/2021 0215   LDLCALC 82 07/18/2021 0215   LDLCALC 101 (H) 09/24/2018 0924    Additional studies/ records that were reviewed today include:  Echo 07/18/2021 IMPRESSIONS     1. Left ventricular ejection fraction, by estimation, is  50 to 55%. Left  ventricular ejection fraction by 2D MOD biplane is 53.5 %. The left  ventricle has low normal function. The left ventricle has no regional wall  motion abnormalities. Left  ventricular diastolic parameters are consistent with Grade I diastolic  dysfunction (impaired relaxation).   2. Right ventricular systolic function is normal. The right ventricular  size is normal. There is mildly elevated pulmonary artery systolic  pressure. The estimated right ventricular systolic pressure is 123XX123 mmHg.   3. Right atrial size was moderately dilated.   4. The mitral valve is grossly normal. Trivial mitral valve  regurgitation.   5. The aortic valve is tricuspid. Aortic valve regurgitation is not  visualized.   6. The inferior vena cava is normal in size with <50% respiratory  variability, suggesting right atrial pressure of 8 mmHg.   7. Rhythm strip during this exam demostrated normal sinus rhythm.   Comparison(s): No prior Echocardiogram.   Risk Assessment/Calculations:    CHA2DS2-VASc Score = 3   This indicates a 3.2% annual risk of stroke. The patient's score is based upon: CHF History: 0 HTN History: 0 Diabetes History: 0 Stroke History: 0 Vascular Disease History: 1 Age Score: 1 Gender Score: 1        ASSESSMENT:    1. Preoperative clearance   2. Paroxysmal atrial fibrillation (HCC)   3. DOE (dyspnea on exertion)   4. Hyperlipidemia, unspecified hyperlipidemia type   5. Tobacco abuse    6. Coronary artery calcification seen on CT scan      PLAN:  In order of problems listed above:  Preop clearance for ostomy reversal. Has chronic DOE that is unchanged. No history of chest pain. With episode of Afib will have her wear a monitor. CHADSVASC=3 not on anticoag b/c of brief nature of Afib and impending surgery.  Echo normal LVEF 50-55% no valve abn. Mets over 6. She can proceed with ostomy reversal without further cardiac w/u at this time.  According to the Revised Cardiac Risk Index (RCRI), her Perioperative Risk of Major Cardiac Event is (%): 0.9  Her Functional Capacity in METs is: 6.61 according to the Duke Activity Status Index (DASI).    PAF in the setting of SBO-never wore 2 week ZIO no anticoagulation because of pending surgery. Needs ostomy reversal. Will have her wear a 2 week ZIO.   DOE chronic and most likely related to COPD. She does have atherosclerosis on CT abd.  HLD-LDL 82 intolerant to statin.  Tobacco/ETOH smoking 5-7 cigarettes daily. Hasn't had any alcohol in weeks. Was drinking 1-2 glasses of wine daily.   Aortic atherosclerosis on CT intolerant to statin. Consider coronary CTA in future.    Shared Decision Making/Informed Consent        Medication Adjustments/Labs and Tests Ordered: Current medicines are reviewed at length with the patient today.  Concerns regarding medicines are outlined above.  Medication changes, Labs and Tests ordered today are listed in the Patient Instructions below. Patient Instructions  Medication Instructions:  Your physician recommends that you continue on your current medications as directed. Please refer to the Current Medication list given to you today.  *If you need a refill on your cardiac medications before your next appointment, please call your pharmacy*   Lab Work: NONE   If you have labs (blood work) drawn today and your tests are completely normal, you will receive your results only by: [place] (if you have MyChart) OR A paper copy in the mail If  you have any lab test that is abnormal or we need to change your treatment, we will call you to review the results.   Testing/Procedures: Bryn Gulling- Long Term Monitor Instructions   Your physician has requested you wear your ZIO patch monitor___14__days.   This is a single patch monitor.  Irhythm supplies one patch monitor per enrollment.  Additional stickers are not available.   Please do not apply patch if you will be having a Nuclear Stress Test, Echocardiogram, Cardiac CT, MRI, or Chest Xray during the time frame you would be wearing the monitor. The patch cannot be worn during these tests.  You cannot remove and re-apply the ZIO XT patch monitor.   Your ZIO patch monitor will be sent USPS Priority mail from [facility] directly to your home address. The monitor may also be mailed to a PO BOX if home delivery is not available.   It may take 3-5 days to receive your monitor after you have been enrolled.   Once you have received you monitor, please review enclosed instructions.  Your monitor has already been registered assigning a specific monitor serial # to you.   Applying the monitor   Shave hair from upper left chest.   Hold abrader disc by orange tab.  Rub abrader in 40 strokes over left upper chest as indicated in your monitor instructions.   Clean area with 4 enclosed alcohol pads .  Use all pads to assure are is cleaned thoroughly.  Let dry.   Apply patch as indicated in monitor instructions.  Patch will be place under collarbone on left side of chest with arrow pointing upward.   Rub patch adhesive wings for 2 minutes.Remove white label marked "1".  Remove white label marked "2".  Rub patch adhesive wings for 2 additional minutes.   While looking in a mirror, press and release button in center of patch.  A small green light will flash 3-4 times .  This will be your only indicator the monitor has been turned  on.     Do not shower for the first 24 hours.  You may shower after the first 24 hours.   Press button if you feel a symptom. You will hear a small click.  Record Date, Time and Symptom in the Patient Log Book.   When you are ready to remove patch, follow instructions on last 2 pages of Patient Log Book.  Stick patch monitor onto last page of Patient Log Book.   Place Patient Log Book in [place] box.  Use locking tab on box and tape box closed securely.  The Orange and [employer] has [employer] on it.  Please place in mailbox as soon as possible.  Your physician should have your test results approximately 7 days after the monitor has been mailed back to [facility].   Call [place] at [phone] if you have questions regarding your ZIO XT patch monitor.  Call them immediately if you see an orange light blinking on your monitor.   If your monitor falls off in less than 4 days contact our Monitor department at [phone].  If your monitor becomes loose or falls off after 4 days call [employer] at [phone] for suggestions on securing your monitor.     Follow-Up: At [facility], you and your health needs are our priority.  As part of our continuing mission to provide you with exceptional heart care, we have created designated Provider Care Teams.  These Care Teams  include your primary Cardiologist (physician) and Advanced Practice Providers (APPs -  Physician Assistants and Nurse Practitioners) who all work together to provide you with the care you need, when you need it.  We recommend signing up for the patient portal called "MyChart".  Sign up information is provided on this After Visit Summary.  MyChart is used to connect with patients for Virtual Visits (Telemedicine).  Patients are able to view lab/test results, encounter notes, upcoming appointments, etc.  Non-urgent messages can be sent to your provider as well.   To learn more about what you can do with  MyChart, go to [email or web address].    Your next appointment:   3-4 month(s)  The format for your next appointment:   In Person  Provider:   You may see Brandi Casino, MD or one of the following Advanced Practice Providers on your designated Care Team:   [place], PA-C  Ermalinda Barrios, [place] .    Other Instructions Thank you for choosing [facility]!      Sumner Boast, PA-C  08/28/2021 2:01 PM    [facility] [place], [place], [place]  [address] Phone: [phone]; Fax: [phone]

## 2021-08-28 ENCOUNTER — Other Ambulatory Visit: Payer: Self-pay

## 2021-08-28 ENCOUNTER — Ambulatory Visit (INDEPENDENT_AMBULATORY_CARE_PROVIDER_SITE_OTHER): Payer: Medicare Other

## 2021-08-28 ENCOUNTER — Ambulatory Visit: Payer: Medicare Other | Admitting: Physician Assistant

## 2021-08-28 ENCOUNTER — Encounter: Payer: Self-pay | Admitting: Physician Assistant

## 2021-08-28 VITALS — BP 136/80 | HR 65 | Ht 63.5 in | Wt 109.6 lb

## 2021-08-28 DIAGNOSIS — I480 Paroxysmal atrial fibrillation: Secondary | ICD-10-CM

## 2021-08-28 DIAGNOSIS — Z720 Tobacco use: Secondary | ICD-10-CM | POA: Diagnosis not present

## 2021-08-28 DIAGNOSIS — Z01818 Encounter for other preprocedural examination: Secondary | ICD-10-CM

## 2021-08-28 DIAGNOSIS — E785 Hyperlipidemia, unspecified: Secondary | ICD-10-CM | POA: Diagnosis not present

## 2021-08-28 DIAGNOSIS — R0609 Other forms of dyspnea: Secondary | ICD-10-CM

## 2021-08-28 DIAGNOSIS — I2510 Atherosclerotic heart disease of native coronary artery without angina pectoris: Secondary | ICD-10-CM

## 2021-08-28 NOTE — Patient Instructions (Signed)
Medication Instructions:  Your physician recommends that you continue on your current medications as directed. Please refer to the Current Medication list given to you today.  *If you need a refill on your cardiac medications before your next appointment, please call your pharmacy*   Lab Work: NONE   If you have labs (blood work) drawn today and your tests are completely normal, you will receive your results only by: MyChart Message (if you have MyChart) OR A paper copy in the mail If you have any lab test that is abnormal or we need to change your treatment, we will call you to review the results.   Testing/Procedures: Christena Deem- Long Term Monitor Instructions   Your physician has requested you wear your ZIO patch monitor___14__days.   This is a single patch monitor.  Irhythm supplies one patch monitor per enrollment.  Additional stickers are not available.   Please do not apply patch if you will be having a Nuclear Stress Test, Echocardiogram, Cardiac CT, MRI, or Chest Xray during the time frame you would be wearing the monitor. The patch cannot be worn during these tests.  You cannot remove and re-apply the ZIO XT patch monitor.   Your ZIO patch monitor will be sent USPS Priority mail from [facility] directly to your home address. The monitor may also be mailed to a PO BOX if home delivery is not available.   It may take 3-5 days to receive your monitor after you have been enrolled.   Once you have received you monitor, please review enclosed instructions.  Your monitor has already been registered assigning a specific monitor serial # to you.   Applying the monitor   Shave hair from upper left chest.   Hold abrader disc by orange tab.  Rub abrader in 40 strokes over left upper chest as indicated in your monitor instructions.   Clean area with 4 enclosed alcohol pads .  Use all pads to assure are is cleaned thoroughly.  Let dry.   Apply patch as indicated in monitor  instructions.  Patch will be place under collarbone on left side of chest with arrow pointing upward.   Rub patch adhesive wings for 2 minutes.Remove white label marked "1".  Remove white label marked "2".  Rub patch adhesive wings for 2 additional minutes.   While looking in a mirror, press and release button in center of patch.  A small green light will flash 3-4 times .  This will be your only indicator the monitor has been turned on.     Do not shower for the first 24 hours.  You may shower after the first 24 hours.   Press button if you feel a symptom. You will hear a small click.  Record Date, Time and Symptom in the Patient Log Book.   When you are ready to remove patch, follow instructions on last 2 pages of Patient Log Book.  Stick patch monitor onto last page of Patient Log Book.   Place Patient Log Book in [place] box.  Use locking tab on box and tape box closed securely.  The Orange and [employer] has [employer] on it.  Please place in mailbox as soon as possible.  Your physician should have your test results approximately 7 days after the monitor has been mailed back to [facility].   Call [facility] Customer Care at [phone] if you have questions regarding your ZIO XT patch monitor.  Call them immediately if you see an orange  light blinking on your monitor.   If your monitor falls off in less than 4 days contact our Monitor department at [phone].  If your monitor becomes loose or falls off after 4 days call [employer] at [phone] for suggestions on securing your monitor.     Follow-Up: At [facility], you and your health needs are our priority.  As part of our continuing mission to provide you with exceptional heart care, we have created designated Provider Care Teams.  These Care Teams include your primary Cardiologist (physician) and Advanced Practice Providers (APPs -  Physician Assistants and Nurse Practitioners) who all work together to provide you with  the care you need, when you need it.  We recommend signing up for the patient portal called "MyChart".  Sign up information is provided on this After Visit Summary.  MyChart is used to connect with patients for Virtual Visits (Telemedicine).  Patients are able to view lab/test results, encounter notes, upcoming appointments, etc.  Non-urgent messages can be sent to your provider as well.   To learn more about what you can do with MyChart, go to [email or web address].    Your next appointment:   3-4 month(s)  The format for your next appointment:   In Person  Provider:   You may see Chrystie Nose, MD or one of the following Advanced Practice Providers on your designated Care Team:   [place], PA-C  Jacolyn Reedy, [place] .    Other Instructions Thank you for choosing [facility]!

## 2021-08-29 DIAGNOSIS — I700 Atherosclerosis of aorta: Secondary | ICD-10-CM | POA: Diagnosis not present

## 2021-08-29 DIAGNOSIS — I4891 Unspecified atrial fibrillation: Secondary | ICD-10-CM | POA: Diagnosis not present

## 2021-08-29 DIAGNOSIS — N321 Vesicointestinal fistula: Secondary | ICD-10-CM | POA: Diagnosis not present

## 2021-08-29 DIAGNOSIS — I10 Essential (primary) hypertension: Secondary | ICD-10-CM | POA: Diagnosis not present

## 2021-08-29 DIAGNOSIS — D649 Anemia, unspecified: Secondary | ICD-10-CM | POA: Diagnosis not present

## 2021-08-29 DIAGNOSIS — Z0189 Encounter for other specified special examinations: Secondary | ICD-10-CM | POA: Diagnosis not present

## 2021-08-29 DIAGNOSIS — J309 Allergic rhinitis, unspecified: Secondary | ICD-10-CM | POA: Diagnosis not present

## 2021-08-29 DIAGNOSIS — Z9049 Acquired absence of other specified parts of digestive tract: Secondary | ICD-10-CM | POA: Diagnosis not present

## 2021-08-29 DIAGNOSIS — E538 Deficiency of other specified B group vitamins: Secondary | ICD-10-CM | POA: Diagnosis not present

## 2021-08-29 DIAGNOSIS — M109 Gout, unspecified: Secondary | ICD-10-CM | POA: Diagnosis not present

## 2021-08-30 ENCOUNTER — Ambulatory Visit: Payer: Medicare Other | Admitting: General Surgery

## 2021-08-30 ENCOUNTER — Ambulatory Visit (INDEPENDENT_AMBULATORY_CARE_PROVIDER_SITE_OTHER): Payer: Medicare Other | Admitting: General Surgery

## 2021-08-30 ENCOUNTER — Other Ambulatory Visit: Payer: Self-pay

## 2021-08-30 ENCOUNTER — Encounter: Payer: Self-pay | Admitting: General Surgery

## 2021-08-30 VITALS — BP 178/98 | HR 67 | Temp 98.6°F | Resp 12 | Ht 64.0 in | Wt 110.0 lb

## 2021-08-30 DIAGNOSIS — Z933 Colostomy status: Secondary | ICD-10-CM | POA: Diagnosis not present

## 2021-08-31 MED ORDER — NEOMYCIN SULFATE 500 MG PO TABS: 1000.0000 mg | ORAL_TABLET | Freq: Three times a day (TID) | ORAL | 0 refills | Status: DC

## 2021-08-31 MED ORDER — METRONIDAZOLE 500 MG PO TABS: 500.0000 mg | ORAL_TABLET | Freq: Three times a day (TID) | ORAL | 0 refills | Status: DC

## 2021-08-31 MED ORDER — NA SULFATE-K SULFATE-MG SULF 17.5-3.13-1.6 GM/177ML PO SOLN: 1.0000 | Freq: Once | ORAL | 0 refills | Status: AC

## 2021-08-31 NOTE — H&P (Addendum)
Brandi Rowe is an 66 y.o. female.   Chief Complaint: Status post Hartman's procedure, screening colonoscopy HPI: Brandi Rowe is a 66 year old white female who underwent Hartman's procedure for diverticulitis with abscess and colovesical fistula in October 2022 who now presents for colostomy reversal.  She is in need of a screening colonoscopy as this could not be completed prior to her surgical intervention.  She denies any family history of colon cancer.  She has intermittent left sided crampy abdominal pain.  She denies any fever or chills.  She has been seen recently by cardiology due to episodes of paroxysmal atrial fibrillation and has been cleared for surgery.  She has not anticoagulated.  Past Medical History:  Diagnosis Date   Acute febrile illness 01/04/2021   Anxiety    Colovaginal fistula    Colovesical fistula    a. 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   Dehydration 01/04/2021   Elevated MCV 01/04/2021   Gout    Nausea 01/04/2021   PAF (paroxysmal atrial fibrillation) ([place])    a. 06/2021 in setting of SBO, HypoK/Mg; b. 06/2021 Echo: EF 50-55%, no rwma, GrI DD, Nl RV size/fxn, RVSP 36.9mmHg. Mod dil RA. Triv MR.   Sepsis ([place]) 01/05/2021   Tobacco abuse     Past Surgical History:  Procedure Laterality Date   BLADDER REPAIR N/A 05/17/2021   Procedure: Closure of Cystotomy;  Surgeon: Cleon Gustin, MD;  Location: [facility];  Service: Urology;  Laterality: N/A;   CARPAL TUNNEL RELEASE Right 2002   MANDIBLE SURGERY  1985   PARTIAL COLECTOMY N/A 05/17/2021   Procedure: PARTIAL COLECTOMY WITH COLOSTOMY;  Surgeon: Aviva Signs, MD;  Location: [facility];  Service: General;  Laterality: N/A;   PELVIC LYMPH NODE DISSECTION N/A 05/17/2021   Procedure: PELVIC DISSECTION;  Surgeon: Florian Buff, MD;  Location: [facility];  Service: Gynecology;  Laterality: N/A;    Family History  Problem Relation Age of Onset   Congestive Heart Failure Mother    Uterine cancer Mother     Pancreatic cancer Father    Atrial fibrillation Brother    Stroke Maternal Grandmother    Heart disease Maternal Grandmother    Glaucoma Maternal Grandfather    Social History:  reports that she has been smoking cigarettes. She has a 23.00 pack-year smoking history. She has never used smokeless tobacco. She reports that she does not currently use alcohol after a past usage of about 7.0 standard drinks per week. She reports that she does not use drugs.  Allergies:  Allergies  Allergen Reactions   Grass Extracts [Gramineae Pollens]     Dust, mold, mites    No medications prior to admission.    No results found for this or any previous visit (from the past 48 hour(s)). No results found.  Review of Systems  Constitutional:  Positive for fatigue.  HENT: Negative.    Eyes: Negative.   Respiratory: Negative.    Cardiovascular: Negative.   Gastrointestinal:  Positive for abdominal pain.  Endocrine: Negative.   Genitourinary: Negative.   Musculoskeletal: Negative.   Allergic/Immunologic: Negative.   Neurological: Negative.   Hematological: Negative.   Psychiatric/Behavioral: Negative.     There were no vitals taken for this visit. Physical Exam Vitals reviewed.  Constitutional:      Appearance: Normal appearance. She is normal weight. She is not ill-appearing.  HENT:     Head: Normocephalic and atraumatic.  Cardiovascular:     Rate and Rhythm: Normal rate and regular rhythm.  Heart sounds: Normal heart sounds. No murmur heard.   No friction rub. No gallop.  Pulmonary:     Effort: Pulmonary effort is normal. No respiratory distress.     Breath sounds: Normal breath sounds. No stridor. No wheezing, rhonchi or rales.  Abdominal:     General: Abdomen is flat. There is no distension.     Palpations: Abdomen is soft. There is no mass.     Tenderness: There is no abdominal tenderness. There is no guarding or rebound.     Hernia: No hernia is present.     Comments: Colostomy  present along left side of abdomen.  Pink and patent.  Skin:    General: Skin is warm and dry.  Neurological:     Mental Status: She is alert and oriented to person, place, and time.     Assessment/Plan Impression: Status post Hartman's procedure, need for screening colonoscopy Plan: Brandi Rowe is undergoing a colonoscopy on 09/19/2021 and then subsequently will undergo colostomy reversal on 09/20/2021.  The risks and benefits of both procedures including bleeding, infection, perforation, anastomotic leak, cardiopulmonary difficulties, and the possibility of needing a permanent colostomy were fully explained to the Brandi Rowe, who gave informed consent.  Suprep, neomycin, and Flagyl have been ordered preoperatively.  Aviva Signs, MD 08/31/2021, 10:33 AM

## 2021-08-31 NOTE — H&P (Signed)
Brandi Rowe is an 66 y.o. female.   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  She is in need of a screening colonoscopy as this could not be completed prior to her surgical intervention.  She denies any family history of colon cancer.  She has intermittent left sided crampy abdominal pain.  She denies any fever or chills.  She has been seen recently by cardiology due to episodes of paroxysmal atrial fibrillation and has been cleared for surgery.  She has not anticoagulated.  Past Medical History:  Diagnosis Date   Acute febrile illness 01/04/2021   Anxiety    Colovaginal fistula    Colovesical fistula    a. 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   Dehydration 01/04/2021   Elevated MCV 01/04/2021   Gout    Nausea 01/04/2021   PAF (paroxysmal atrial fibrillation) ([place])    a. 06/2021 in setting of SBO, HypoK/Mg; b. 06/2021 Echo: EF 50-55%, no rwma, GrI DD, Nl RV size/fxn, RVSP 36.6mmHg. Mod dil RA. Triv MR.   Sepsis ([place]) 01/05/2021   Tobacco abuse     Past Surgical History:  Procedure Laterality Date   BLADDER REPAIR N/A 05/17/2021   Procedure: Closure of Cystotomy;  Surgeon: Cleon Gustin, MD;  Location: [facility];  Service: Urology;  Laterality: N/A;   CARPAL TUNNEL RELEASE Right 2002   MANDIBLE SURGERY  1985   PARTIAL COLECTOMY N/A 05/17/2021   Procedure: PARTIAL COLECTOMY WITH COLOSTOMY;  Surgeon: Aviva Signs, MD;  Location: [facility];  Service: General;  Laterality: N/A;   PELVIC LYMPH NODE DISSECTION N/A 05/17/2021   Procedure: PELVIC DISSECTION;  Surgeon: Florian Buff, MD;  Location: [facility];  Service: Gynecology;  Laterality: N/A;    Family History  Problem Relation Age of Onset   Congestive Heart Failure Mother    Uterine cancer Mother     Pancreatic cancer Father    Atrial fibrillation Brother    Stroke Maternal Grandmother    Heart disease Maternal Grandmother    Glaucoma Maternal Grandfather    Social History:  reports that she has been smoking cigarettes. She has a 23.00 pack-year smoking history. She has never used smokeless tobacco. She reports that she does not currently use alcohol after a past usage of about 7.0 standard drinks per week. She reports that she does not use drugs.  Allergies:  Allergies  Allergen Reactions   Grass Extracts [Gramineae Pollens]     Dust, mold, mites    No medications prior to admission.    No results found for this or any previous visit (from the past 48 hour(s)). No results found.  Review of Systems  Constitutional:  Positive for fatigue.  HENT: Negative.    Eyes: Negative.   Respiratory: Negative.    Cardiovascular: Negative.   Gastrointestinal:  Positive for abdominal pain.  Endocrine: Negative.   Genitourinary: Negative.   Musculoskeletal: Negative.   Allergic/Immunologic: Negative.   Neurological: Negative.   Hematological: Negative.   Psychiatric/Behavioral: Negative.     There were no vitals taken for this visit. Physical Exam Vitals reviewed.  Constitutional:      Appearance: Normal appearance. She is normal weight. She is not ill-appearing.  HENT:     Head: Normocephalic and atraumatic.  Cardiovascular:     Rate and Rhythm: Normal rate and regular rhythm.  Heart sounds: Normal heart sounds. No murmur heard.   No friction rub. No gallop.  Pulmonary:     Effort: Pulmonary effort is normal. No respiratory distress.     Breath sounds: Normal breath sounds. No stridor. No wheezing, rhonchi or rales.  Abdominal:     General: Abdomen is flat. There is no distension.     Palpations: Abdomen is soft. There is no mass.     Tenderness: There is no abdominal tenderness. There is no guarding or rebound.     Hernia: No hernia is present.     Comments: Colostomy  present along left side of abdomen.  Pink and patent.  Skin:    General: Skin is warm and dry.  Neurological:     Mental Status: She is alert and oriented to person, place, and time.     Assessment/Plan Impression: Status post Hartman's procedure, need for screening colonoscopy Plan: Patient is undergoing a colonoscopy on 09/19/2021 and then subsequently will undergo colostomy reversal on 09/20/2021.  The risks and benefits of both procedures including bleeding, infection, perforation, anastomotic leak, cardiopulmonary difficulties, and the possibility of needing a permanent colostomy were fully explained to the patient, who gave informed consent.  Suprep, neomycin, and Flagyl have been ordered preoperatively.  Aviva Signs, MD 08/31/2021, 10:41 AM

## 2021-08-31 NOTE — Progress Notes (Signed)
Subjective:     Brandi Rowe  Patient here to schedule colostomy reversal.  She has been seen by cardiology who has cleared her for surgery. Objective:    BP (!) 178/98    Pulse 67    Temp 98.6 F (37 C) (Other (Comment))    Resp 12    Ht 5\' 4"  (1.626 m)    Wt 110 lb (49.9 kg)    SpO2 98%    BMI 18.88 kg/m   General:  alert, cooperative, and no distress       Assessment:    Status post Hartman's procedure, need for colostomy reversal    Plan:   Patient is scheduled for a colonoscopy on 09/19/2021 as she has not had 1 for many years.  She will then undergo colostomy reversal on 09/20/2021.  The risks and benefits of both procedures including bleeding, infection, cardiopulmonary difficulties, anastomotic leak, and perforation were fully explained to the patient, who gave informed consent.  She also realizes that if I cannot do the reversal safely, she will continue to have a colostomy.  GoLytely, neomycin, and Flagyl have been ordered for bowel preparation.

## 2021-09-15 NOTE — Patient Instructions (Signed)
Brandi Rowe  09/15/2021     @PREFPERIOPPHARMACY @   Your procedure is scheduled on  09/20/2021.   Report to [facility] at  0600  A.M.   Call this number if you have problems the morning of surgery:  [phone]   Remember:  Follow the diet and prep instructions given to you by the office.     Take these medicines the morning of surgery with A SIP OF WATER                  klonopin, metoprolol, hydrocodone (if needed).     Do not wear jewelry, make-up or nail polish.  Do not wear lotions, powders, or perfumes, or deodorant.  Do not shave 48 hours prior to surgery.  Men may shave face and neck.  Do not bring valuables to the hospital.  [facility] is not responsible for any belongings or valuables.  Contacts, dentures or bridgework may not be worn into surgery.  Leave your suitcase in the car.  After surgery it may be brought to your room.  For patients admitted to the hospital, discharge time will be determined by your treatment team.  Patients discharged the day of surgery will not be allowed to drive home.    Special instructions:   DO NOT smoke tobacco or vape for 24 hours before your procedure.  Please read over the following fact sheets that you were given. Pain Booklet, Coughing and Deep Breathing, Blood Transfusion Information, Surgical Site Infection Prevention, Anesthesia Post-op Instructions, and Care and Recovery After Surgery      Colostomy Reversal Surgery, Care After This sheet gives you information about how to care for yourself after your procedure. Your health care provider may also give you more specific instructions. If you have problems or questions, contact your health care provider. What can I expect after the procedure? After the procedure, it is common to have: Pain and discomfort in your abdomen, especially near your incision. Loose stools (diarrhea). Decreased appetite. Constipation. Follow these instructions at  home: Activity  Do not lift anything that is heavier than 10 lb (4.5 kg), or the limit that you are told, until your health care provider says that it is safe. Return to your normal activities as told by your health care provider. Ask your health care provider what activities are safe for you. Avoid sitting for a long time without moving. Get up to take short walks every 1-2 hours. This is important to improve blood flow and breathing. Ask for help if you feel weak or unsteady. Avoid strenuous activity, contact sports, and abdominal exercises for 4 weeks or as long as told by your health care provider. Incision care  Follow instructions from your health care provider about how to take care of your incision. Make sure you: Wash your hands with soap and water before and after you change your bandage (dressing). If soap and water are not available, use hand sanitizer. Change your dressing as told by your health care provider. Leave stitches (sutures), skin glue, or adhesive strips in place. These skin closures may need to stay in place for 2 weeks or longer. If adhesive strip edges start to loosen and curl up, you may trim the loose edges. Do not remove adhesive strips completely unless your health care provider tells you to do that. Keep the incision area clean and dry. Check your incision area every day for signs of infection. Check for: More  redness, swelling, or pain. More fluid or blood. Warmth. Pus or a bad smell. Bathing Do not take baths, swim, or use a hot tub until your health care provider approves. Ask your health care provider if you may take showers. You may only be allowed to take sponge baths. If your health care provider approves bathing and showering, cover the dressing with a watertight covering to protect it from water. Do not let the dressing get wet. Keep the dressing dry until your health care provider says it can be removed. Driving Do not drive for 24 hours if you were  given a sedative during your procedure. Follow other driving restrictions as told by your health care provider. Do not drive or use heavy machinery while taking prescription pain medicine. Eating and drinking Follow instructions from your health care provider about eating or drinking restrictions. This may include: What to eat and drink. You may be told to start eating a bland diet. Over time, you may slowly resume a more normal, healthy diet. How much to eat and drink. You should eat small meals often and stop eating when you feel full. Take nutrition supplements as told by your health care provider or dietitian. General instructions Take over-the-counter and prescription medicines only as told by your health care provider. Take steps to treat diarrhea or constipation as told by your health care provider. Your health care provider may recommend that you: Drink enough fluid to keep your urine pale yellow. Avoid fluids that contain a lot of sugar or caffeine, such as energy drinks, sports drinks, and soda. Eat bland, easy-to-digest foods in small amounts as you are able. These foods include bananas, applesauce, rice, lean meats, toast, and crackers. Take over-the-counter or prescription medicines. Limit foods that are high in fat and processed sugars, such as fried or sweet foods. Do not use any products that contain nicotine or tobacco, such as cigarettes, e-cigarettes, and chewing tobacco. These can delay incision healing after surgery. If you need help quitting, ask your health care provider. Keep all follow-up visits as told by your health care provider. This is important. Contact a health care provider if: You have more redness, swelling, or pain at the site of your incision. You have more fluid or blood coming from your incision. Your incision feels warm to the touch. You have pus or a bad smell coming from your incision. You have a fever. Your incision breaks open. You feel  nauseous. You are not able to have a bowel movement (are constipated). Your diarrhea gets worse. You have pain that is not controlled with medicine. Get help right away if you have: Abdominal pain that does not go away or becomes severe. Frequent vomiting and you are not able to eat or drink. Difficulty breathing. Summary After colostomy reversal surgery, it is common to have abdominal pain, decreased appetite, diarrhea, or constipation. Follow instructions from your health care provider about how to take care of your incision. Do not let the dressing get wet. Take over-the-counter and prescription medicines only as told by your health care provider. Contact your health care provider if you are not able to have a bowel movement (are constipated). Keep all follow-up visits as told by your health care provider. This is important. This information is not intended to replace advice given to you by your health care provider. Make sure you discuss any questions you have with your health care provider. Document Revised: 12/25/2017 Document Reviewed: 12/25/2017 Elsevier Patient Education  2022 [employer]  General Anesthesia, Adult, Care After This sheet gives you information about how to care for yourself after your procedure. Your health care provider may also give you more specific instructions. If you have problems or questions, contact your health care provider. What can I expect after the procedure? After the procedure, the following side effects are common: Pain or discomfort at the IV site. Nausea. Vomiting. Sore throat. Trouble concentrating. Feeling cold or chills. Feeling weak or tired. Sleepiness and fatigue. Soreness and body aches. These side effects can affect parts of the body that were not involved in surgery. Follow these instructions at home: For the time period you were told by your health care provider:  Rest. Do not participate in activities where you could fall or  become injured. Do not drive or use machinery. Do not drink alcohol. Do not take sleeping pills or medicines that cause drowsiness. Do not make important decisions or sign legal documents. Do not take care of children on your own. Eating and drinking Follow any instructions from your health care provider about eating or drinking restrictions. When you feel hungry, start by eating small amounts of foods that are soft and easy to digest (bland), such as toast. Gradually return to your regular diet. Drink enough fluid to keep your urine pale yellow. If you vomit, rehydrate by drinking water, juice, or clear broth. General instructions If you have sleep apnea, surgery and certain medicines can increase your risk for breathing problems. Follow instructions from your health care provider about wearing your sleep device: Anytime you are sleeping, including during daytime naps. While taking prescription pain medicines, sleeping medicines, or medicines that make you drowsy. Have a responsible adult stay with you for the time you are told. It is important to have someone help care for you until you are awake and alert. Return to your normal activities as told by your health care provider. Ask your health care provider what activities are safe for you. Take over-the-counter and prescription medicines only as told by your health care provider. If you smoke, do not smoke without supervision. Keep all follow-up visits as told by your health care provider. This is important. Contact a health care provider if: You have nausea or vomiting that does not get better with medicine. You cannot eat or drink without vomiting. You have pain that does not get better with medicine. You are unable to pass urine. You develop a skin rash. You have a fever. You have redness around your IV site that gets worse. Get help right away if: You have difficulty breathing. You have chest pain. You have blood in your urine or  stool, or you vomit blood. Summary After the procedure, it is common to have a sore throat or nausea. It is also common to feel tired. Have a responsible adult stay with you for the time you are told. It is important to have someone help care for you until you are awake and alert. When you feel hungry, start by eating small amounts of foods that are soft and easy to digest (bland), such as toast. Gradually return to your regular diet. Drink enough fluid to keep your urine pale yellow. Return to your normal activities as told by your health care provider. Ask your health care provider what activities are safe for you. This information is not intended to replace advice given to you by your health care provider. Make sure you discuss any questions you have with your health care provider. Document Revised: 03/24/2020  Document Reviewed: 10/22/2019 Elsevier Patient Education  2022 Else[employer] Inc. How to Use Chlorhexidine for Bathing Chlorhexidine gluconate (CHG) is a germ-killing (antiseptic) solution that is used to clean the skin. It can get rid of the bacteria that normally live on the skin and can keep them away for about 24 hours. To clean your skin with CHG, you may be given: A CHG solution to use in the shower or as part of a sponge bath. A prepackaged cloth that contains CHG. Cleaning your skin with CHG may help lower the risk for infection: While you are staying in the intensive care unit of the hospital. If you have a vascular access, such as a central line, to provide short-term or long-term access to your veins. If you have a catheter to drain urine from your bladder. If you are on a ventilator. A ventilator is a machine that helps you breathe by moving air in and out of your lungs. After surgery. What are the risks? Risks of using CHG include: A skin reaction. Hearing loss, if CHG gets in your ears and you have a perforated eardrum. Eye injury, if CHG gets in your eyes and is not rinsed  out. The CHG product catching fire. Make sure that you avoid smoking and flames after applying CHG to your skin. Do not use CHG: If you have a chlorhexidine allergy or have previously reacted to chlorhexidine. On babies younger than 382 months of age. How to use CHG solution Use CHG only as told by your health care provider, and follow the instructions on the label. Use the full amount of CHG as directed. Usually, this is one bottle. During a shower Follow these steps when using CHG solution during a shower (unless your health care provider gives you different instructions): Start the shower. Use your normal soap and shampoo to wash your face and hair. Turn off the shower or move out of the shower stream. Pour the CHG onto a clean washcloth. Do not use any type of brush or rough-edged sponge. Starting at your neck, lather your body down to your toes. Make sure you follow these instructions: If you will be having surgery, pay special attention to the part of your body where you will be having surgery. Scrub this area for at least 1 minute. Do not use CHG on your head or face. If the solution gets into your ears or eyes, rinse them well with water. Avoid your genital area. Avoid any areas of skin that have broken skin, cuts, or scrapes. Scrub your back and under your arms. Make sure to wash skin folds. Let the lather sit on your skin for 1-2 minutes or as long as told by your health care provider. Thoroughly rinse your entire body in the shower. Make sure that all body creases and crevices are rinsed well. Dry off with a clean towel. Do not put any substances on your body afterward--such as powder, lotion, or perfume--unless you are told to do so by your health care provider. Only use lotions that are recommended by the manufacturer. Put on clean clothes or pajamas. If it is the night before your surgery, sleep in clean sheets.  During a sponge bath Follow these steps when using CHG solution  during a sponge bath (unless your health care provider gives you different instructions): Use your normal soap and shampoo to wash your face and hair. Pour the CHG onto a clean washcloth. Starting at your neck, lather your body down to your toes. Make  sure you follow these instructions: If you will be having surgery, pay special attention to the part of your body where you will be having surgery. Scrub this area for at least 1 minute. Do not use CHG on your head or face. If the solution gets into your ears or eyes, rinse them well with water. Avoid your genital area. Avoid any areas of skin that have broken skin, cuts, or scrapes. Scrub your back and under your arms. Make sure to wash skin folds. Let the lather sit on your skin for 1-2 minutes or as long as told by your health care provider. Using a different clean, wet washcloth, thoroughly rinse your entire body. Make sure that all body creases and crevices are rinsed well. Dry off with a clean towel. Do not put any substances on your body afterward--such as powder, lotion, or perfume--unless you are told to do so by your health care provider. Only use lotions that are recommended by the manufacturer. Put on clean clothes or pajamas. If it is the night before your surgery, sleep in clean sheets. How to use CHG prepackaged cloths Only use CHG cloths as told by your health care provider, and follow the instructions on the label. Use the CHG cloth on clean, dry skin. Do not use the CHG cloth on your head or face unless your health care provider tells you to. When washing with the CHG cloth: Avoid your genital area. Avoid any areas of skin that have broken skin, cuts, or scrapes. Before surgery Follow these steps when using a CHG cloth to clean before surgery (unless your health care provider gives you different instructions): Using the CHG cloth, vigorously scrub the part of your body where you will be having surgery. Scrub using a  back-and-forth motion for 3 minutes. The area on your body should be completely wet with CHG when you are done scrubbing. Do not rinse. Discard the cloth and let the area air-dry. Do not put any substances on the area afterward, such as powder, lotion, or perfume. Put on clean clothes or pajamas. If it is the night before your surgery, sleep in clean sheets.  For general bathing Follow these steps when using CHG cloths for general bathing (unless your health care provider gives you different instructions). Use a separate CHG cloth for each area of your body. Make sure you wash between any folds of skin and between your fingers and toes. Wash your body in the following order, switching to a new cloth after each step: The front of your neck, shoulders, and chest. Both of your arms, under your arms, and your hands. Your stomach and groin area, avoiding the genitals. Your right leg and foot. Your left leg and foot. The back of your neck, your back, and your buttocks. Do not rinse. Discard the cloth and let the area air-dry. Do not put any substances on your body afterward--such as powder, lotion, or perfume--unless you are told to do so by your health care provider. Only use lotions that are recommended by the manufacturer. Put on clean clothes or pajamas. Contact a health care provider if: Your skin gets irritated after scrubbing. You have questions about using your solution or cloth. You swallow any chlorhexidine. Call your local poison control center [phone] in the [place] Get help right away if: Your eyes itch badly, or they become very red or swollen. Your skin itches badly and is red or swollen. Your hearing changes. You have trouble seeing. You have swelling or  tingling in your mouth or throat. You have trouble breathing. These symptoms may represent a serious problem that is an emergency. Do not wait to see if the symptoms will go away. Get medical help right away. Call your  local emergency services (911 in the [place] Do not drive yourself to the hospital. Summary Chlorhexidine gluconate (CHG) is a germ-killing (antiseptic) solution that is used to clean the skin. Cleaning your skin with CHG may help to lower your risk for infection. You may be given CHG to use for bathing. It may be in a bottle or in a prepackaged cloth to use on your skin. Carefully follow your health care provider's instructions and the instructions on the product label. Do not use CHG if you have a chlorhexidine allergy. Contact your health care provider if your skin gets irritated after scrubbing. This information is not intended to replace advice given to you by your health care provider. Make sure you discuss any questions you have with your health care provider. Document Revised: 09/19/2020 Document Reviewed: 09/19/2020 Elsevier Patient Education  2022 [employer].

## 2021-09-18 ENCOUNTER — Other Ambulatory Visit (HOSPITAL_COMMUNITY): Admission: RE | Admit: 2021-09-18 | Payer: Medicare Other | Source: Ambulatory Visit

## 2021-09-18 ENCOUNTER — Encounter (HOSPITAL_COMMUNITY): Payer: Self-pay

## 2021-09-18 ENCOUNTER — Encounter (HOSPITAL_COMMUNITY)
Admission: RE | Admit: 2021-09-18 | Discharge: 2021-09-18 | Disposition: A | Discharge status: Home / Self Care | Payer: Medicare Other | Source: Ambulatory Visit | Attending: General Surgery | Admitting: General Surgery

## 2021-09-18 VITALS — BP 186/91 | HR 68 | Temp 98.6°F | Resp 16 | Ht 64.0 in | Wt 110.0 lb

## 2021-09-18 DIAGNOSIS — K5730 Diverticulosis of large intestine without perforation or abscess without bleeding: Secondary | ICD-10-CM | POA: Diagnosis not present

## 2021-09-18 DIAGNOSIS — Z20822 Contact with and (suspected) exposure to covid-19: Secondary | ICD-10-CM | POA: Insufficient documentation

## 2021-09-18 DIAGNOSIS — K660 Peritoneal adhesions (postprocedural) (postinfection): Secondary | ICD-10-CM | POA: Diagnosis not present

## 2021-09-18 DIAGNOSIS — F17210 Nicotine dependence, cigarettes, uncomplicated: Secondary | ICD-10-CM | POA: Diagnosis not present

## 2021-09-18 DIAGNOSIS — Z8049 Family history of malignant neoplasm of other genital organs: Secondary | ICD-10-CM | POA: Diagnosis not present

## 2021-09-18 DIAGNOSIS — I10 Essential (primary) hypertension: Secondary | ICD-10-CM | POA: Diagnosis not present

## 2021-09-18 DIAGNOSIS — M109 Gout, unspecified: Secondary | ICD-10-CM | POA: Diagnosis not present

## 2021-09-18 DIAGNOSIS — Z933 Colostomy status: Secondary | ICD-10-CM | POA: Insufficient documentation

## 2021-09-18 DIAGNOSIS — Z01818 Encounter for other preprocedural examination: Secondary | ICD-10-CM

## 2021-09-18 DIAGNOSIS — Z800 Family history of malignant neoplasm of digestive organs: Secondary | ICD-10-CM | POA: Diagnosis not present

## 2021-09-18 DIAGNOSIS — R5383 Other fatigue: Secondary | ICD-10-CM | POA: Diagnosis not present

## 2021-09-18 DIAGNOSIS — Z433 Encounter for attention to colostomy: Secondary | ICD-10-CM | POA: Diagnosis not present

## 2021-09-18 DIAGNOSIS — K922 Gastrointestinal hemorrhage, unspecified: Secondary | ICD-10-CM | POA: Diagnosis not present

## 2021-09-18 DIAGNOSIS — D62 Acute posthemorrhagic anemia: Secondary | ICD-10-CM | POA: Diagnosis not present

## 2021-09-18 DIAGNOSIS — Z8249 Family history of ischemic heart disease and other diseases of the circulatory system: Secondary | ICD-10-CM | POA: Diagnosis not present

## 2021-09-18 DIAGNOSIS — Z9049 Acquired absence of other specified parts of digestive tract: Secondary | ICD-10-CM | POA: Diagnosis not present

## 2021-09-18 DIAGNOSIS — I4891 Unspecified atrial fibrillation: Secondary | ICD-10-CM | POA: Diagnosis not present

## 2021-09-18 DIAGNOSIS — I480 Paroxysmal atrial fibrillation: Secondary | ICD-10-CM | POA: Diagnosis not present

## 2021-09-18 DIAGNOSIS — Z823 Family history of stroke: Secondary | ICD-10-CM | POA: Diagnosis not present

## 2021-09-18 DIAGNOSIS — Z1211 Encounter for screening for malignant neoplasm of colon: Secondary | ICD-10-CM | POA: Diagnosis not present

## 2021-09-18 HISTORY — DX: Essential (primary) hypertension: I10

## 2021-09-18 HISTORY — DX: Cardiac arrhythmia, unspecified: I49.9

## 2021-09-18 LAB — CBC WITH DIFFERENTIAL/PLATELET
Abs Immature Granulocytes: 0.02 10*3/uL (ref 0.00–0.07)
Basophils Absolute: 0.1 10*3/uL (ref 0.0–0.1)
Basophils Relative: 1 %
Eosinophils Absolute: 0.3 10*3/uL (ref 0.0–0.5)
Eosinophils Relative: 4 %
HCT: 42.7 % (ref 36.0–46.0)
Hemoglobin: 14.5 g/dL (ref 12.0–15.0)
Immature Granulocytes: 0 %
Lymphocytes Relative: 23 %
Lymphs Abs: 1.8 10*3/uL (ref 0.7–4.0)
MCH: 32.1 pg (ref 26.0–34.0)
MCHC: 34 g/dL (ref 30.0–36.0)
MCV: 94.5 fL (ref 80.0–100.0)
Monocytes Absolute: 0.5 10*3/uL (ref 0.1–1.0)
Monocytes Relative: 6 %
Neutro Abs: 5.2 10*3/uL (ref 1.7–7.7)
Neutrophils Relative %: 66 %
Platelets: 223 10*3/uL (ref 150–400)
RBC: 4.52 MIL/uL (ref 3.87–5.11)
RDW: 13.8 % (ref 11.5–15.5)
WBC: 7.8 10*3/uL (ref 4.0–10.5)
nRBC: 0 % (ref 0.0–0.2)

## 2021-09-18 LAB — BASIC METABOLIC PANEL
Anion gap: 4 — ABNORMAL LOW (ref 5–15)
BUN: 23 mg/dL (ref 8–23)
CO2: 27 mmol/L (ref 22–32)
Calcium: 9.2 mg/dL (ref 8.9–10.3)
Chloride: 105 mmol/L (ref 98–111)
Creatinine, Ser: 0.9 mg/dL (ref 0.44–1.00)
GFR, Estimated: >60 mL/min (ref >60)
Glucose, Bld: 103 mg/dL — ABNORMAL HIGH (ref 70–99)
Potassium: 4.2 mmol/L (ref 3.5–5.1)
Sodium: 136 mmol/L (ref 135–145)

## 2021-09-18 LAB — TYPE AND SCREEN
ABO/RH(D): B POS
Antibody Screen: NEGATIVE

## 2021-09-18 LAB — SARS CORONAVIRUS 2 (TAT 6-24 HRS): SARS Coronavirus 2: NEGATIVE

## 2021-09-19 ENCOUNTER — Ambulatory Visit (HOSPITAL_BASED_OUTPATIENT_CLINIC_OR_DEPARTMENT_OTHER): Payer: Medicare Other | Admitting: Anesthesiology

## 2021-09-19 ENCOUNTER — Encounter (HOSPITAL_COMMUNITY): Payer: Self-pay | Admitting: General Surgery

## 2021-09-19 ENCOUNTER — Ambulatory Visit (HOSPITAL_BASED_OUTPATIENT_CLINIC_OR_DEPARTMENT_OTHER)
Admission: RE | Admit: 2021-09-19 | Discharge: 2021-09-19 | Disposition: A | Discharge status: Home / Self Care | Payer: Medicare Other | Source: Home / Self Care | Attending: General Surgery | Admitting: General Surgery

## 2021-09-19 ENCOUNTER — Ambulatory Visit (HOSPITAL_COMMUNITY): Payer: Medicare Other | Admitting: Anesthesiology

## 2021-09-19 ENCOUNTER — Other Ambulatory Visit: Payer: Self-pay

## 2021-09-19 ENCOUNTER — Encounter (HOSPITAL_COMMUNITY)
Admission: RE | Disposition: A | Discharge status: Home / Self Care | Payer: Self-pay | Source: Home / Self Care | Attending: General Surgery

## 2021-09-19 DIAGNOSIS — Z1211 Encounter for screening for malignant neoplasm of colon: Secondary | ICD-10-CM

## 2021-09-19 DIAGNOSIS — I10 Essential (primary) hypertension: Secondary | ICD-10-CM | POA: Insufficient documentation

## 2021-09-19 DIAGNOSIS — Z9049 Acquired absence of other specified parts of digestive tract: Secondary | ICD-10-CM | POA: Insufficient documentation

## 2021-09-19 DIAGNOSIS — Z79899 Other long term (current) drug therapy: Secondary | ICD-10-CM | POA: Insufficient documentation

## 2021-09-19 DIAGNOSIS — I4891 Unspecified atrial fibrillation: Secondary | ICD-10-CM

## 2021-09-19 DIAGNOSIS — Z933 Colostomy status: Secondary | ICD-10-CM | POA: Insufficient documentation

## 2021-09-19 DIAGNOSIS — I480 Paroxysmal atrial fibrillation: Secondary | ICD-10-CM | POA: Diagnosis not present

## 2021-09-19 DIAGNOSIS — K5730 Diverticulosis of large intestine without perforation or abscess without bleeding: Secondary | ICD-10-CM | POA: Diagnosis not present

## 2021-09-19 DIAGNOSIS — F17210 Nicotine dependence, cigarettes, uncomplicated: Secondary | ICD-10-CM | POA: Insufficient documentation

## 2021-09-19 DIAGNOSIS — Z7189 Other specified counseling: Secondary | ICD-10-CM

## 2021-09-19 DIAGNOSIS — Z5181 Encounter for therapeutic drug level monitoring: Secondary | ICD-10-CM

## 2021-09-19 HISTORY — PX: COLONOSCOPY WITH PROPOFOL: SHX5780

## 2021-09-19 SURGERY — COLONOSCOPY WITH PROPOFOL
Anesthesia: General

## 2021-09-19 MED ORDER — CHLORHEXIDINE GLUCONATE CLOTH 2 % EX PADS: 6.0000 | MEDICATED_PAD | Freq: Once | CUTANEOUS | Status: DC

## 2021-09-19 MED ORDER — PROPOFOL 10 MG/ML IV BOLUS
INTRAVENOUS | Status: DC | PRN
  Administered 2021-09-19 (×2): 20 mg via INTRAVENOUS
  Administered 2021-09-19 (×2): 30 mg via INTRAVENOUS
  Administered 2021-09-19: 80 mg via INTRAVENOUS

## 2021-09-19 MED ORDER — LACTATED RINGERS IV SOLN: INTRAVENOUS | Status: DC

## 2021-09-19 MED ORDER — LIDOCAINE HCL (CARDIAC) PF 100 MG/5ML IV SOSY
PREFILLED_SYRINGE | INTRAVENOUS | Status: DC | PRN
  Administered 2021-09-19: 50 mg via INTRAVENOUS

## 2021-09-19 NOTE — Anesthesia Postprocedure Evaluation (Signed)
Anesthesia Post Note  Patient: Brandi Rowe  Procedure(s) Performed: COLONOSCOPY WITH PROPOFOL  Patient location during evaluation: Endoscopy Anesthesia Type: General Level of consciousness: awake and alert and oriented Pain management: pain level controlled Vital Signs Assessment: post-procedure vital signs reviewed and stable Respiratory status: spontaneous breathing, nonlabored ventilation and respiratory function stable Cardiovascular status: blood pressure returned to baseline and stable Postop Assessment: no apparent nausea or vomiting Anesthetic complications: no   No notable events documented.   Last Vitals:  Vitals:   09/19/21 0650 09/19/21 0744  BP: (!) 153/87 127/73  Pulse: 66   Resp: 20 15  Temp: 36.6 C 36.6 C  SpO2: 96% 98%    Last Pain:  Vitals:   09/19/21 0744  TempSrc: Oral  PainSc: 0-No pain                 Yamin Swingler C Berish Bohman

## 2021-09-19 NOTE — Transfer of Care (Signed)
Immediate Anesthesia Transfer of Care Note  Patient: Brandi Rowe  Procedure(s) Performed: COLONOSCOPY WITH PROPOFOL  Patient Location: Endoscopy Unit  Anesthesia Type:General  Level of Consciousness: awake, alert  and oriented  Airway & Oxygen Therapy: Patient Spontanous Breathing  Post-op Assessment: Report given to RN and Post -op Vital signs reviewed and stable  Post vital signs: Reviewed and stable  Last Vitals:  Vitals Value Taken Time  BP    Temp    Pulse    Resp    SpO2      Last Pain:  Vitals:   09/19/21 0724  TempSrc:   PainSc: 0-No pain      Patients Stated Pain Goal: 5 (09/19/21 0650)  Complications: No notable events documented.

## 2021-09-19 NOTE — Interval H&P Note (Signed)
History and Physical Interval Note:  09/19/2021 7:23 AM  Brandi Rowe  has presented today for surgery, with the diagnosis of SCREENING FOR COLON CANCER.  The various methods of treatment have been discussed with the patient and family. After consideration of risks, benefits and other options for treatment, the patient has consented to  Procedure(s): COLONOSCOPY WITH PROPOFOL (N/A) as a surgical intervention.  The patient's history has been reviewed, patient examined, no change in status, stable for surgery.  I have reviewed the patient's chart and labs.  Questions were answered to the patient's satisfaction.     Franky Macho

## 2021-09-19 NOTE — Op Note (Signed)
[facility] Patient Name: Brandi Rowe Procedure Date: 09/19/2021 7:09 AM MRN: [id number] Date of Birth: [birth year] Attending MD: Aviva Signs , MD CSN: [id number] Age: 66 Admit Type: Outpatient Procedure:                Colonoscopy Indications:              Screening for colorectal malignant neoplasm Providers:                Aviva Signs, MD, Lambert Mody, Raphael Gibney,                            Technician Referring MD:              Medicines:                Propofol per Anesthesia Complications:            No immediate complications. Estimated Blood Loss:     Estimated blood loss: none. Procedure:                Pre-Anesthesia Assessment:                           - Prior to the procedure, a History and Physical                            was performed, and patient medications and                            allergies were reviewed. The patient is competent.                            The risks and benefits of the procedure and the                            sedation options and risks were discussed with the                            patient. All questions were answered and informed                            consent was obtained. Patient identification and                            proposed procedure were verified by the physician,                            the nurse, the anesthetist and the technician in                            the endoscopy suite. Mental Status Examination:                            alert and oriented. Airway Examination: normal                            oropharyngeal airway  and neck mobility. Respiratory                            Examination: clear to auscultation. CV Examination:                            RRR, no murmurs, no S3 or S4. Prophylactic                            Antibiotics: The patient does not require                            prophylactic antibiotics. Prior Anticoagulants: The                            patient has taken  no previous anticoagulant or                            antiplatelet agents except for aspirin. ASA Grade                            Assessment: II - A patient with mild systemic                            disease. After reviewing the risks and benefits,                            the patient was deemed in satisfactory condition to                            undergo the procedure. The anesthesia plan was to                            use monitored anesthesia care (MAC). Immediately                            prior to administration of medications, the patient                            was re-assessed for adequacy to receive sedatives.                            The heart rate, respiratory rate, oxygen                            saturations, blood pressure, adequacy of pulmonary                            ventilation, and response to care were monitored                            throughout the procedure. The physical status of  the patient was re-assessed after the procedure.                           After obtaining informed consent, the colonoscope                            was passed under direct vision thru the anus and                            colostomy. Throughout the procedure, the patient's                            blood pressure, pulse, and oxygen saturations were                            monitored continuously. The [id number])                            scope was introduced through the anus and advanced                            to the the cecum, identified by the appendiceal                            orifice, ileocecal valve and palpation. No                            anatomical landmarks were photographed. The                            colonoscopy was performed without difficulty. The                            total duration of the procedure was 12 minutes. Scope In: 7:28:07 AM Scope Out: 7:39:20 AM Scope Withdrawal Time: 0 hours 2  minutes 53 seconds  Total Procedure Duration: 0 hours 11 minutes 13 seconds  Findings:      The perianal and digital rectal examinations were normal. Rectal stump       without stricture to 15cm.      Many small-mouthed diverticula were found in the entire colon.       Colonoscope advanced thru the left sided colostomy. There was no       evidence of diverticular bleeding. Estimated blood loss: none.      The exam was otherwise without abnormality. Impression:               - Diverticulosis in the entire examined colon.                            There was no evidence of diverticular bleeding.                           - The examination was otherwise normal.                           -  No specimens collected. Moderate Sedation:      Moderate (conscious) sedation was administered by the endoscopy nurse       and supervised by the endoscopist. The patient's oxygen saturation,       heart rate, blood pressure and response to care were monitored. Recommendation:           - Written discharge instructions were provided to                            the patient.                           - The signs and symptoms of potential delayed                            complications were discussed with the patient.                           - Patient has a contact number available for                            emergencies.                           - Return to normal activities tomorrow.                           - Clear liquid diet today.                           - Continue present medications.                           - Repeat colonoscopy in 10 years for screening                            purposes. Procedure Code(s):        --- Professional ---                           [phone], Colonoscopy, flexible; diagnostic, including                            collection of specimen(s) by brushing or washing,                            when performed (separate procedure) Diagnosis Code(s):        ---  Professional ---                           Z12.11, Encounter for screening for malignant                            neoplasm of colon                           K57.30, Diverticulosis of large intestine without  perforation or abscess without bleeding CPT copyright 2019 [employer]. All rights reserved. The codes documented in this report are preliminary and upon coder review may  be revised to meet current compliance requirements. Aviva Signs, MD Aviva Signs, MD 09/19/2021 7:48:05 AM This report has been signed electronically. Number of Addenda: 0

## 2021-09-19 NOTE — Anesthesia Preprocedure Evaluation (Addendum)
Anesthesia Evaluation  Patient identified by MRN, date of birth, ID band Patient awake    Reviewed: Allergy & Precautions, NPO status , Patient's Chart, lab work & pertinent test results, reviewed documented beta blocker date and time   Airway Mallampati: III  TM Distance: >3 FB Neck ROM: Full   Comment: Mandibular sx Dental  (+) Dental Advisory Given   Pulmonary Current Smoker,    Pulmonary exam normal breath sounds clear to auscultation       Cardiovascular Exercise Tolerance: Good hypertension, Pt. on medications and Pt. on home beta blockers Normal cardiovascular exam+ dysrhythmias Atrial Fibrillation  Rhythm:Regular Rate:Normal  18-Sep-2021 11:00:57 [facility]-OPS ROUTINE RECORD 12-17-1955 (65 yr) Female Caucasian Vent. rate 64 BPM PR interval 132 ms QRS duration 88 ms QT/QTcB 422/435 ms P-R-T axes 59 74 87 Normal sinus rhythm Normal ECG When compared with ECG of 17-Jul-2021 22:25,a fib resolved PREVIOUS ECG IS PRESENT Confirmed by Carylon Perches [phone]) on 09/18/2021 10:42:14 PM   Neuro/Psych PSYCHIATRIC DISORDERS Anxiety    GI/Hepatic Bowel prep,(+)     substance abuse  alcohol use, Partial colectomy   Endo/Other  negative endocrine ROS  Renal/GU negative Renal ROS Bladder dysfunction (bladder repair)      Musculoskeletal  (+) Arthritis  (gout),   Abdominal   Peds  Hematology  (+) Blood dyscrasia, anemia ,   Anesthesia Other Findings   Reproductive/Obstetrics negative OB ROS                            Anesthesia Physical Anesthesia Plan  ASA: 2  Anesthesia Plan: General   Post-op Pain Management: Minimal or no pain anticipated   Induction: Intravenous  PONV Risk Score and Plan: TIVA  Airway Management Planned: Nasal Cannula and Natural Airway  Additional Equipment:   Intra-op Plan:   Post-operative Plan:   Informed Consent: I have reviewed  the patients History and Physical, chart, labs and discussed the procedure including the risks, benefits and alternatives for the proposed anesthesia with the patient or authorized representative who has indicated his/her understanding and acceptance.     Dental advisory given  Plan Discussed with: CRNA and Surgeon  Anesthesia Plan Comments:         Anesthesia Quick Evaluation

## 2021-09-19 NOTE — Discharge Instructions (Signed)
Fleets enema x 2 today.

## 2021-09-20 ENCOUNTER — Inpatient Hospital Stay (HOSPITAL_COMMUNITY)
Admission: RE | Admit: 2021-09-20 | Discharge: 2021-09-23 | DRG: 330 | Disposition: A | Discharge status: Home / Self Care | Payer: Medicare Other | Attending: General Surgery | Admitting: General Surgery

## 2021-09-20 ENCOUNTER — Encounter (HOSPITAL_COMMUNITY)
Admission: RE | Disposition: A | Discharge status: Home / Self Care | Payer: Self-pay | Source: Home / Self Care | Attending: General Surgery

## 2021-09-20 ENCOUNTER — Other Ambulatory Visit: Payer: Self-pay

## 2021-09-20 ENCOUNTER — Inpatient Hospital Stay (HOSPITAL_COMMUNITY): Payer: Medicare Other | Admitting: Anesthesiology

## 2021-09-20 ENCOUNTER — Encounter (HOSPITAL_COMMUNITY): Payer: Self-pay | Admitting: General Surgery

## 2021-09-20 DIAGNOSIS — Z8049 Family history of malignant neoplasm of other genital organs: Secondary | ICD-10-CM | POA: Diagnosis not present

## 2021-09-20 DIAGNOSIS — Z933 Colostomy status: Secondary | ICD-10-CM | POA: Diagnosis not present

## 2021-09-20 DIAGNOSIS — K660 Peritoneal adhesions (postprocedural) (postinfection): Secondary | ICD-10-CM

## 2021-09-20 DIAGNOSIS — K5730 Diverticulosis of large intestine without perforation or abscess without bleeding: Secondary | ICD-10-CM | POA: Diagnosis present

## 2021-09-20 DIAGNOSIS — M109 Gout, unspecified: Secondary | ICD-10-CM | POA: Diagnosis present

## 2021-09-20 DIAGNOSIS — I480 Paroxysmal atrial fibrillation: Secondary | ICD-10-CM | POA: Diagnosis present

## 2021-09-20 DIAGNOSIS — Z20822 Contact with and (suspected) exposure to covid-19: Secondary | ICD-10-CM | POA: Diagnosis present

## 2021-09-20 DIAGNOSIS — Z8249 Family history of ischemic heart disease and other diseases of the circulatory system: Secondary | ICD-10-CM | POA: Diagnosis not present

## 2021-09-20 DIAGNOSIS — Z9049 Acquired absence of other specified parts of digestive tract: Secondary | ICD-10-CM | POA: Diagnosis not present

## 2021-09-20 DIAGNOSIS — R5383 Other fatigue: Secondary | ICD-10-CM | POA: Diagnosis present

## 2021-09-20 DIAGNOSIS — Z1211 Encounter for screening for malignant neoplasm of colon: Secondary | ICD-10-CM | POA: Diagnosis not present

## 2021-09-20 DIAGNOSIS — Z823 Family history of stroke: Secondary | ICD-10-CM

## 2021-09-20 DIAGNOSIS — D62 Acute posthemorrhagic anemia: Secondary | ICD-10-CM | POA: Diagnosis not present

## 2021-09-20 DIAGNOSIS — Z433 Encounter for attention to colostomy: Principal | ICD-10-CM

## 2021-09-20 DIAGNOSIS — Z800 Family history of malignant neoplasm of digestive organs: Secondary | ICD-10-CM

## 2021-09-20 DIAGNOSIS — F17210 Nicotine dependence, cigarettes, uncomplicated: Secondary | ICD-10-CM | POA: Diagnosis present

## 2021-09-20 HISTORY — PX: COLOSTOMY REVERSAL: SHX5782

## 2021-09-20 SURGERY — COLOSTOMY REVERSAL
Anesthesia: General | Site: Abdomen

## 2021-09-20 MED ORDER — KETOROLAC TROMETHAMINE 15 MG/ML IJ SOLN
15.0000 mg | Freq: Four times a day (QID) | INTRAMUSCULAR | Status: AC
  Administered 2021-09-20: 15 mg via INTRAVENOUS
  Filled 2021-09-20: qty 1

## 2021-09-20 MED ORDER — ACETAMINOPHEN 325 MG PO TABS: 650.0000 mg | ORAL_TABLET | Freq: Four times a day (QID) | ORAL | Status: DC | PRN

## 2021-09-20 MED ORDER — METOPROLOL TARTRATE 25 MG PO TABS
25.0000 mg | ORAL_TABLET | Freq: Two times a day (BID) | ORAL | Status: DC
Start: 2021-09-20 — End: 2021-09-21
  Administered 2021-09-20: 25 mg via ORAL
  Filled 2021-09-20 (×2): qty 1

## 2021-09-20 MED ORDER — SODIUM CHLORIDE 0.9 % IV SOLN: INTRAVENOUS | Status: DC

## 2021-09-20 MED ORDER — KETOROLAC TROMETHAMINE 15 MG/ML IJ SOLN: 15.0000 mg | Freq: Four times a day (QID) | INTRAMUSCULAR | Status: DC | PRN

## 2021-09-20 MED ORDER — LORAZEPAM 2 MG/ML IJ SOLN
1.0000 mg | Freq: Once | INTRAMUSCULAR | Status: AC
  Administered 2021-09-20: 1 mg via INTRAVENOUS
  Filled 2021-09-20: qty 1

## 2021-09-20 MED ORDER — CHLORHEXIDINE GLUCONATE CLOTH 2 % EX PADS: 6.0000 | MEDICATED_PAD | Freq: Once | CUTANEOUS | Status: DC

## 2021-09-20 MED ORDER — KETAMINE HCL 10 MG/ML IJ SOLN
INTRAMUSCULAR | Status: DC | PRN
  Administered 2021-09-20: 10 mg via INTRAVENOUS
  Administered 2021-09-20: 20 mg via INTRAVENOUS

## 2021-09-20 MED ORDER — DIPHENHYDRAMINE HCL 50 MG/ML IJ SOLN: 12.5000 mg | Freq: Four times a day (QID) | INTRAMUSCULAR | Status: DC | PRN

## 2021-09-20 MED ORDER — BUPIVACAINE LIPOSOME 1.3 % IJ SUSP
INTRAMUSCULAR | Status: AC
  Filled 2021-09-20: qty 20

## 2021-09-20 MED ORDER — ONDANSETRON HCL 4 MG/2ML IJ SOLN: 4.0000 mg | Freq: Four times a day (QID) | INTRAMUSCULAR | Status: DC | PRN

## 2021-09-20 MED ORDER — DEXAMETHASONE SODIUM PHOSPHATE 10 MG/ML IJ SOLN
INTRAMUSCULAR | Status: DC | PRN
Start: 2021-09-20 — End: 2021-09-20
  Administered 2021-09-20: 10 mg via INTRAVENOUS

## 2021-09-20 MED ORDER — FENTANYL CITRATE (PF) 250 MCG/5ML IJ SOLN
INTRAMUSCULAR | Status: AC
Start: 2021-09-20 — End: ?
  Filled 2021-09-20: qty 5

## 2021-09-20 MED ORDER — ONDANSETRON HCL 4 MG/2ML IJ SOLN
INTRAMUSCULAR | Status: AC
  Filled 2021-09-20: qty 2

## 2021-09-20 MED ORDER — LORAZEPAM 2 MG/ML IJ SOLN: 1.0000 mg | INTRAMUSCULAR | Status: DC | PRN

## 2021-09-20 MED ORDER — ALUM & MAG HYDROXIDE-SIMETH 200-200-20 MG/5ML PO SUSP
30.0000 mL | Freq: Four times a day (QID) | ORAL | Status: DC | PRN
Start: 2021-09-20 — End: 2021-09-23
  Administered 2021-09-20: 30 mL via ORAL
  Filled 2021-09-20 (×2): qty 30

## 2021-09-20 MED ORDER — BUPIVACAINE LIPOSOME 1.3 % IJ SUSP
INTRAMUSCULAR | Status: DC | PRN
  Administered 2021-09-20: 20 mL

## 2021-09-20 MED ORDER — ONDANSETRON HCL 4 MG/2ML IJ SOLN
INTRAMUSCULAR | Status: DC | PRN
  Administered 2021-09-20: 4 mg via INTRAVENOUS

## 2021-09-20 MED ORDER — DEXAMETHASONE SODIUM PHOSPHATE 10 MG/ML IJ SOLN
INTRAMUSCULAR | Status: AC
  Filled 2021-09-20: qty 1

## 2021-09-20 MED ORDER — POVIDONE-IODINE 10 % EX OINT
TOPICAL_OINTMENT | CUTANEOUS | Status: DC | PRN
  Administered 2021-09-20: 1 via TOPICAL

## 2021-09-20 MED ORDER — HYDROMORPHONE HCL 1 MG/ML IJ SOLN
0.2500 mg | INTRAMUSCULAR | Status: DC | PRN
  Administered 2021-09-20: 0.5 mg via INTRAVENOUS
  Filled 2021-09-20: qty 0.5

## 2021-09-20 MED ORDER — FOLIC ACID 1 MG PO TABS
500.0000 ug | ORAL_TABLET | ORAL | Status: DC
  Administered 2021-09-22: 0.5 mg via ORAL
  Filled 2021-09-20 (×3): qty 1

## 2021-09-20 MED ORDER — POVIDONE-IODINE 10 % EX OINT
TOPICAL_OINTMENT | CUTANEOUS | Status: AC
  Filled 2021-09-20: qty 1

## 2021-09-20 MED ORDER — FLUTICASONE PROPIONATE 50 MCG/ACT NA SUSP: 2.0000 | Freq: Every day | NASAL | Status: DC | PRN

## 2021-09-20 MED ORDER — MIDAZOLAM HCL 2 MG/2ML IJ SOLN
INTRAMUSCULAR | Status: DC | PRN
  Administered 2021-09-20: 2 mg via INTRAVENOUS

## 2021-09-20 MED ORDER — ORAL CARE MOUTH RINSE: 15.0000 mL | Freq: Once | OROMUCOSAL | Status: AC

## 2021-09-20 MED ORDER — ENOXAPARIN SODIUM 40 MG/0.4ML IJ SOSY: 40.0000 mg | PREFILLED_SYRINGE | INTRAMUSCULAR | Status: DC

## 2021-09-20 MED ORDER — HYDROMORPHONE HCL 1 MG/ML IJ SOLN
0.5000 mg | INTRAMUSCULAR | Status: DC | PRN
  Administered 2021-09-20 – 2021-09-23 (×14): 0.5 mg via INTRAVENOUS
  Filled 2021-09-20 (×14): qty 0.5

## 2021-09-20 MED ORDER — SIMETHICONE 80 MG PO CHEW: 40.0000 mg | CHEWABLE_TABLET | Freq: Four times a day (QID) | ORAL | Status: DC | PRN

## 2021-09-20 MED ORDER — DIPHENHYDRAMINE HCL 12.5 MG/5ML PO ELIX: 12.5000 mg | ORAL_SOLUTION | Freq: Four times a day (QID) | ORAL | Status: DC | PRN

## 2021-09-20 MED ORDER — SODIUM CHLORIDE 0.9 % IV SOLN
1.0000 g | INTRAVENOUS | Status: AC
  Administered 2021-09-20: 1 g via INTRAVENOUS
  Filled 2021-09-20: qty 1

## 2021-09-20 MED ORDER — HEMOSTATIC AGENTS (NO CHARGE) OPTIME
TOPICAL | Status: DC | PRN
  Administered 2021-09-20: 1 via TOPICAL

## 2021-09-20 MED ORDER — 0.9 % SODIUM CHLORIDE (POUR BTL) OPTIME
TOPICAL | Status: DC | PRN
  Administered 2021-09-20: 2000 mL

## 2021-09-20 MED ORDER — HYDROCODONE-ACETAMINOPHEN 5-325 MG PO TABS
1.0000 | ORAL_TABLET | ORAL | Status: DC | PRN
  Administered 2021-09-22 – 2021-09-23 (×4): 2 via ORAL
  Filled 2021-09-20 (×4): qty 2

## 2021-09-20 MED ORDER — ONDANSETRON HCL 4 MG/2ML IJ SOLN: 4.0000 mg | Freq: Once | INTRAMUSCULAR | Status: DC | PRN

## 2021-09-20 MED ORDER — PHENYLEPHRINE 40 MCG/ML (10ML) SYRINGE FOR IV PUSH (FOR BLOOD PRESSURE SUPPORT)
PREFILLED_SYRINGE | INTRAVENOUS | Status: AC
  Filled 2021-09-20: qty 10

## 2021-09-20 MED ORDER — ENOXAPARIN SODIUM 40 MG/0.4ML IJ SOSY
40.0000 mg | PREFILLED_SYRINGE | Freq: Once | INTRAMUSCULAR | Status: AC
  Administered 2021-09-20: 40 mg via SUBCUTANEOUS
  Filled 2021-09-20: qty 0.4

## 2021-09-20 MED ORDER — FENTANYL CITRATE (PF) 100 MCG/2ML IJ SOLN
INTRAMUSCULAR | Status: DC | PRN
  Administered 2021-09-20: 100 ug via INTRAVENOUS
  Administered 2021-09-20 (×2): 50 ug via INTRAVENOUS

## 2021-09-20 MED ORDER — EPHEDRINE SULFATE (PRESSORS) 50 MG/ML IJ SOLN
INTRAMUSCULAR | Status: DC | PRN
  Administered 2021-09-20 (×3): 5 mg via INTRAVENOUS

## 2021-09-20 MED ORDER — ONDANSETRON 4 MG PO TBDP
4.0000 mg | ORAL_TABLET | Freq: Four times a day (QID) | ORAL | Status: DC | PRN
Start: 2021-09-20 — End: 2021-09-23

## 2021-09-20 MED ORDER — CHLORHEXIDINE GLUCONATE 0.12 % MT SOLN
15.0000 mL | Freq: Once | OROMUCOSAL | Status: AC
  Administered 2021-09-20: 15 mL via OROMUCOSAL
  Filled 2021-09-20: qty 15

## 2021-09-20 MED ORDER — PROPOFOL 10 MG/ML IV BOLUS
INTRAVENOUS | Status: AC
  Filled 2021-09-20: qty 20

## 2021-09-20 MED ORDER — PHENYLEPHRINE HCL (PRESSORS) 10 MG/ML IV SOLN
INTRAVENOUS | Status: DC | PRN
  Administered 2021-09-20: 40 ug via INTRAVENOUS
  Administered 2021-09-20: 80 ug via INTRAVENOUS
  Administered 2021-09-20: 40 ug via INTRAVENOUS

## 2021-09-20 MED ORDER — ALVIMOPAN 12 MG PO CAPS
12.0000 mg | ORAL_CAPSULE | ORAL | Status: AC
  Administered 2021-09-20: 12 mg via ORAL
  Filled 2021-09-20: qty 1

## 2021-09-20 MED ORDER — ROCURONIUM BROMIDE 10 MG/ML (PF) SYRINGE
PREFILLED_SYRINGE | INTRAVENOUS | Status: DC | PRN
  Administered 2021-09-20 (×2): 20 mg via INTRAVENOUS
  Administered 2021-09-20: 50 mg via INTRAVENOUS

## 2021-09-20 MED ORDER — EPHEDRINE 5 MG/ML INJ
INTRAVENOUS | Status: AC
Start: 2021-09-20 — End: ?
  Filled 2021-09-20: qty 5

## 2021-09-20 MED ORDER — LACTATED RINGERS IV SOLN
INTRAVENOUS | Status: DC
  Administered 2021-09-20: 1000 mL via INTRAVENOUS

## 2021-09-20 MED ORDER — PROPOFOL 10 MG/ML IV BOLUS
INTRAVENOUS | Status: DC | PRN
  Administered 2021-09-20: 150 mg via INTRAVENOUS

## 2021-09-20 MED ORDER — MIDAZOLAM HCL 2 MG/2ML IJ SOLN
INTRAMUSCULAR | Status: AC
Start: 2021-09-20 — End: ?
  Filled 2021-09-20: qty 2

## 2021-09-20 MED ORDER — ALVIMOPAN 12 MG PO CAPS
12.0000 mg | ORAL_CAPSULE | Freq: Two times a day (BID) | ORAL | Status: DC
  Administered 2021-09-21 – 2021-09-23 (×5): 12 mg via ORAL
  Filled 2021-09-20 (×5): qty 1

## 2021-09-20 MED ORDER — ACETAMINOPHEN 650 MG RE SUPP: 650.0000 mg | Freq: Four times a day (QID) | RECTAL | Status: DC | PRN

## 2021-09-20 MED ORDER — SUGAMMADEX SODIUM 500 MG/5ML IV SOLN
INTRAVENOUS | Status: DC | PRN
Start: 2021-09-20 — End: 2021-09-20
  Administered 2021-09-20: 200 mg via INTRAVENOUS

## 2021-09-20 MED ORDER — KETAMINE HCL 50 MG/5ML IJ SOSY
PREFILLED_SYRINGE | INTRAMUSCULAR | Status: AC
  Filled 2021-09-20: qty 5

## 2021-09-20 MED ORDER — LIDOCAINE HCL (CARDIAC) PF 100 MG/5ML IV SOSY
PREFILLED_SYRINGE | INTRAVENOUS | Status: DC | PRN
  Administered 2021-09-20: 40 mg via INTRATRACHEAL

## 2021-09-20 MED ORDER — ROCURONIUM BROMIDE 10 MG/ML (PF) SYRINGE
PREFILLED_SYRINGE | INTRAVENOUS | Status: AC
  Filled 2021-09-20: qty 10

## 2021-09-20 SURGICAL SUPPLY — 50 items
APL PRP STRL LF DISP 70% ISPRP (MISCELLANEOUS) ×1
APL SKNCLS STERI-STRIP NONHPOA (GAUZE/BANDAGES/DRESSINGS) ×1
BENZOIN TINCTURE PRP APPL 2/3 (GAUZE/BANDAGES/DRESSINGS) ×1 IMPLANT
CHLORAPREP W/TINT 26 (MISCELLANEOUS) ×3 IMPLANT
CLOTH BEACON ORANGE TIMEOUT ST (SAFETY) ×3 IMPLANT
COVER LIGHT HANDLE STERIS (MISCELLANEOUS) ×6 IMPLANT
DRSG OPSITE POSTOP 4X8 (GAUZE/BANDAGES/DRESSINGS) ×1 IMPLANT
ELECT REM PT RETURN 9FT ADLT (ELECTROSURGICAL) ×2
ELECTRODE REM PT RTRN 9FT ADLT (ELECTROSURGICAL) ×2 IMPLANT
GAUZE SPONGE 4X4 12PLY STRL (GAUZE/BANDAGES/DRESSINGS) ×3 IMPLANT
GLOVE SURG ENC MOIS LTX SZ6.5 (GLOVE) ×6 IMPLANT
GLOVE SURG POLYISO LF SZ7.5 (GLOVE) ×3 IMPLANT
GLOVE SURG UNDER POLY LF SZ6.5 (GLOVE) ×6 IMPLANT
GLOVE SURG UNDER POLY LF SZ7 (GLOVE) ×9 IMPLANT
GOWN STRL REUS W/TWL LRG LVL3 (GOWN DISPOSABLE) ×18 IMPLANT
HANDLE SUCTION POOLE (INSTRUMENTS) ×2 IMPLANT
INST SET MAJOR GENERAL (KITS) ×3 IMPLANT
KIT SURGICAL DEVON (SET/KITS/TRAYS/PACK) ×3 IMPLANT
KIT TURNOVER KIT A (KITS) ×3 IMPLANT
LIGASURE IMPACT 36 18CM CVD LR (INSTRUMENTS) ×3 IMPLANT
MANIFOLD NEPTUNE II (INSTRUMENTS) ×3 IMPLANT
NDL HYPO 18GX1.5 BLUNT FILL (NEEDLE) ×2 IMPLANT
NEEDLE 22X1 1/2 (OR ONLY) (NEEDLE) ×3 IMPLANT
NEEDLE HYPO 18GX1.5 BLUNT FILL (NEEDLE) ×2 IMPLANT
NS IRRIG 1000ML POUR BTL (IV SOLUTION) ×6 IMPLANT
PACK COLON (CUSTOM PROCEDURE TRAY) ×3 IMPLANT
PAD ARMBOARD 7.5X6 YLW CONV (MISCELLANEOUS) ×3 IMPLANT
PENCIL SMOKE EVACUATOR COATED (MISCELLANEOUS) ×3 IMPLANT
POWDER SURGICEL 3.0 GRAM (HEMOSTASIS) ×1 IMPLANT
RELOAD PROXIMATE 75MM BLUE (ENDOMECHANICALS) ×4 IMPLANT
RELOAD STAPLE 75 3.8 BLU REG (ENDOMECHANICALS) IMPLANT
RETRACTOR WOUND ALXS 18CM MED (MISCELLANEOUS) IMPLANT
RTRCTR WOUND ALEXIS O 18CM MED (MISCELLANEOUS) ×2
SET BASIN LINEN APH (SET/KITS/TRAYS/PACK) ×3 IMPLANT
SHEET LAVH (DRAPES) ×3 IMPLANT
SPONGE INTESTINAL PEANUT (DISPOSABLE) ×1 IMPLANT
SPONGE T-LAP 18X18 ~~LOC~~+RFID (SPONGE) ×6 IMPLANT
STAPLER GUN LINEAR PROX 60 (STAPLE) ×1 IMPLANT
STAPLER PROXIMATE 75MM BLUE (STAPLE) ×1 IMPLANT
STAPLER VISISTAT (STAPLE) ×4 IMPLANT
SUCTION POOLE HANDLE (INSTRUMENTS) ×2
SUT CHROMIC 2 0 SH (SUTURE) ×1 IMPLANT
SUT PDS AB 0 CTX 60 (SUTURE) ×6 IMPLANT
SUT SILK 0 FSL (SUTURE) ×3 IMPLANT
SUT SILK 3 0 SH CR/8 (SUTURE) ×6 IMPLANT
SUT VIC AB 0 CT1 27 (SUTURE) ×2
SUT VIC AB 0 CT1 27XCR 8 STRN (SUTURE) ×2 IMPLANT
SYR 20ML LL LF (SYRINGE) ×6 IMPLANT
TRAY FOLEY MTR SLVR 16FR STAT (SET/KITS/TRAYS/PACK) ×3 IMPLANT
YANKAUER SUCT BULB TIP 10FT TU (MISCELLANEOUS) ×3 IMPLANT

## 2021-09-20 NOTE — Anesthesia Preprocedure Evaluation (Signed)
Anesthesia Evaluation  ?Patient identified by MRN, date of birth, ID band ?Patient awake ? ? ? ?Reviewed: ?Allergy & Precautions, NPO status , Patient's Chart, lab work & pertinent test results, reviewed documented beta blocker date and time  ? ?Airway ?Mallampati: III ? ?TM Distance: >3 FB ?Neck ROM: Full ? ? ?Comment: Mandibular sx Dental ? ?(+) Dental Advisory Given ?  ?Pulmonary ?Current Smoker and Patient abstained from smoking.,  ?  ?Pulmonary exam normal ?breath sounds clear to auscultation ? ? ? ? ? ? Cardiovascular ?Exercise Tolerance: Good ?hypertension, Pt. on medications ?Normal cardiovascular exam+ dysrhythmias Atrial Fibrillation  ?Rhythm:Regular Rate:Normal ? ?18-Sep-2021 11:00:57 [facility]-AP-OPS ROUTINE RECORD ?03-May-1956 (13 yr) ?Female Caucasian ?Vent. rate 64 BPM ?PR interval 132 ms ?QRS duration 88 ms ?QT/QTcB 422/435 ms ?P-R-T axes 59 74 87 ?Normal sinus rhythm ?Normal ECG ?When compared with ECG of 17-Jul-2021 22:25,a fib resolved ?PREVIOUS ECG IS PRESENT ?Confirmed by Asencion Noble [phone]) on 09/18/2021 10:42:14 PM ?  ?Neuro/Psych ?PSYCHIATRIC DISORDERS Anxiety negative neurological ROS ?   ? GI/Hepatic ?Neg liver ROS, Bowel prep,(+)  ?  ?  ? alcohol use, Partial colectomy ?  ?Endo/Other  ?negative endocrine ROS ? Renal/GU ?negative Renal ROS  ?negative genitourinary ?  ?Musculoskeletal ?negative musculoskeletal ROS ?(+)  ? Abdominal ?  ?Peds ?negative pediatric ROS ?(+)  Hematology ? ?(+) Blood dyscrasia, anemia ,   ?Anesthesia Other Findings ?Mandible sx ? Reproductive/Obstetrics ?negative OB ROS ? ?  ? ? ? ? ? ? ? ? ? ? ? ? ? ?  ?  ? ? ? ? ? ? ? ? ?Anesthesia Physical ? ?Anesthesia Plan ? ?ASA: 2 ? ?Anesthesia Plan: General  ? ?Post-op Pain Management: Minimal or no pain anticipated and Dilaudid IV  ? ?Induction: Intravenous ? ?PONV Risk Score and Plan: 3 and Ondansetron, Dexamethasone and Midazolam ? ?Airway Management Planned: Oral  ETT ? ?Additional Equipment:  ? ?Intra-op Plan:  ? ?Post-operative Plan:  ? ?Informed Consent: I have reviewed the patients History and Physical, chart, labs and discussed the procedure including the risks, benefits and alternatives for the proposed anesthesia with the patient or authorized representative who has indicated his/her understanding and acceptance.  ? ? ? ?Dental advisory given ? ?Plan Discussed with: CRNA and Surgeon ? ?Anesthesia Plan Comments:   ? ? ? ? ? ? ?Anesthesia Quick Evaluation ? ?

## 2021-09-20 NOTE — Anesthesia Procedure Notes (Signed)
Procedure Name: Intubation ?Date/Time: 09/20/2021 7:42 AM ?Performed by: Karna Dupes, CRNA ?Pre-anesthesia Checklist: Patient identified, Emergency Drugs available, Suction available and Patient being monitored ?Patient Re-evaluated:Patient Re-evaluated prior to induction ?Oxygen Delivery Method: Circle system utilized ?Preoxygenation: Pre-oxygenation with 100% oxygen ?Induction Type: IV induction ?Ventilation: Mask ventilation without difficulty ?Laryngoscope Size: Mac and 3 ?Grade View: Grade I ?Tube type: Oral ?Tube size: 7.0 mm ?Number of attempts: 1 ?Airway Equipment and Method: Stylet ?Placement Confirmation: ETT inserted through vocal cords under direct vision, positive ETCO2 and breath sounds checked- equal and bilateral ?Secured at: 21 cm ?Tube secured with: Tape ?Dental Injury: Teeth and Oropharynx as per pre-operative assessment  ? ? ? ? ?

## 2021-09-20 NOTE — Interval H&P Note (Signed)
History and Physical Interval Note: ? ?09/20/2021 ?7:23 AM ? ?TULANI KIDNEY  has presented today for surgery, with the diagnosis of COLOSTOMY REVERSAL S/P HARTMAN'S PROCEDURE.  The various methods of treatment have been discussed with the patient and family. After consideration of risks, benefits and other options for treatment, the patient has consented to  Procedure(s): ?COLOSTOMY REVERSAL (N/A) as a surgical intervention.  The patient's history has been reviewed, patient examined, no change in status, stable for surgery.  I have reviewed the patient's chart and labs.  Questions were answered to the patient's satisfaction.   ? ? ?Franky Macho ? ? ?

## 2021-09-20 NOTE — Progress Notes (Signed)
Pt c/o indigestion , per Dr. Alva Garnet speak with Dr. Lovell Sheehan. See verbal orders in [facility] given by Dr. Lovell Sheehan ? ?

## 2021-09-20 NOTE — Anesthesia Postprocedure Evaluation (Signed)
Anesthesia Post Note ? ?Patient: Brandi Rowe ? ?Procedure(s) Performed: Small Bowel Resection (Abdomen) ? ?Patient location during evaluation: PACU ?Anesthesia Type: General ?Level of consciousness: awake and alert and oriented ?Pain management: pain level controlled ?Vital Signs Assessment: post-procedure vital signs reviewed and stable ?Respiratory status: spontaneous breathing, nonlabored ventilation and respiratory function stable ?Cardiovascular status: blood pressure returned to baseline and stable ?Postop Assessment: no apparent nausea or vomiting ?Anesthetic complications: no ? ? ?No notable events documented. ? ? ?Last Vitals:  ?Vitals:  ? 09/20/21 1030 09/20/21 1045  ?BP: (!) 146/89 (!) 142/81  ?Pulse:    ?Resp: 13 11  ?Temp:    ?SpO2: 98%   ?  ?Last Pain:  ?Vitals:  ? 09/20/21 0958  ?TempSrc:   ?PainSc: 5   ? ? ?  ?  ?  ?  ?  ?  ? ?Eldrick Penick C Amey Hossain ? ? ? ? ?

## 2021-09-20 NOTE — Transfer of Care (Signed)
Immediate Anesthesia Transfer of Care Note ? ?Patient: Brandi Rowe ? ?Procedure(s) Performed: Small Bowel Resection (Abdomen) ? ?Patient Location: PACU ? ?Anesthesia Type:General ? ?Level of Consciousness: awake ? ?Airway & Oxygen Therapy: Patient Spontanous Breathing and Patient connected to nasal cannula oxygen ? ?Post-op Assessment: Report given to RN and Post -op Vital signs reviewed and stable ? ?Post vital signs: Reviewed and stable ? ?Last Vitals:  ?Vitals Value Taken Time  ?BP 145/82 09/20/21 0949  ?Temp    ?Pulse 77 09/20/21 0950  ?Resp 12 09/20/21 0950  ?SpO2 100 % 09/20/21 0950  ?Vitals shown include unvalidated device data. ? ?Last Pain:  ?Vitals:  ? 09/20/21 0651  ?TempSrc: Oral  ?PainSc: 0-No pain  ?   ? ?Patients Stated Pain Goal: 5 (09/20/21 3710) ? ?Complications: No notable events documented. ?

## 2021-09-20 NOTE — Progress Notes (Addendum)
Blood noted in colostomy bag, this RN spoke with Dr. Arnoldo Morale and made aware. Per Dr. Arnoldo Morale blood to be expected and should improve. Pt appears comfortable and VSS, will continue to monitor.  ?

## 2021-09-20 NOTE — Op Note (Addendum)
Patient:  Brandi Rowe ? ?DOB:  27-Aug-1955 ? ?MRN:  [id number] ? ? ?Preop Diagnosis: Status post Hartman's procedure ? ?Postop Diagnosis: Same, adhesive disease ? ?Procedure: Exploratory laparotomy, partial small bowel resection ? ?Surgeon: Aviva Signs, MD ? ?Assistant: Steffanie Dunn, DO ? ?Anes: General endotracheal ? 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  The risks and benefits of the procedure including bleeding, infection, cardiopulmonary difficulties, anastomotic leak, and the possibility of not being able to reverse the colostomy were fully explained to the patient, who gave informed consent. ? ?Procedure note: The patient was placed in the lithotomy position after induction of general endotracheal anesthesia.  The abdomen and perineum were prepped and draped using the usual sterile technique with Betadine and ChloraPrep.  Surgical site confirmation was performed. ? ?A midline incision was made through the previous lower midline surgical scar.  The peritoneal cavity was entered into without difficulty.  On exploration of the pelvis, there were multiple small bowel adhesions down to the bladder, pelvis, and rectal stump.  I was able to free away the small bowel.  There looks like there was granulomatous involvement of the small bowel and 2 areas that were close to each other.  This appeared to be coming from the rectal stump region.  There was no purulent drainage or stool present.  Small bowel was freed away sharply.  Ultimately, it was better to resect these 2 areas in continuity.  A 75 GIA was placed proximally distally across the affected area.  The mesentery was divided using the LigaSure.  A side-to-side enteroenterostomy was performed using a GIA 75 stapler.  The enterotomy was closed using a TA 60 stapler.  The staple line was bolstered  using 3-0 silk Lembert sutures.  The mesenteric defect was closed using a 2-0 Chromic Gut running suture.  In assessing the pelvis, I was minimally able to get around the right lateral posterior aspect of the sacrum.  It appeared that the right adnexa and bladder were adhesed to the rectal stump with no definable surgical plane.  This was most seen along the left true pelvis where there was no obvious peritoneal reflection.  I was concerned that there was no plane between the bladder or the left ureter to safely isolate the rectal stump.  After trying to establish surgical planes, I elected to abort the reversal.  Granulomatous tissue along the rectal stump was cauterized and Surgicel powder was placed.  The abdominal cavity was copiously irrigated with normal saline just prior to the application of the surgical powder.  The bowel was returned into the abdominal cavity in orderly fashion.  The fascia was reapproximated using a looped 0 PDS suture.  The subcutaneous layer was irrigated with normal saline.  The skin was closed using staples.  Betadine ointment and a dry sterile dressing were applied.  An ostomy appliance was then reapplied. ? ?All tape and needle counts were correct at the end of the procedure.  The patient was extubated in the operating room and transferred to PACU in stable condition. ? ?Dr. Okey Dupre assisted me throughout the procedure, lysis of adhesions, and small bowel resection.  No RNFA was available. ? ?Complications: None ? ?EBL: 50 cc ? ?Specimen: Small bowel ? ? ?  ?

## 2021-09-21 ENCOUNTER — Encounter (HOSPITAL_COMMUNITY): Payer: Self-pay | Admitting: General Surgery

## 2021-09-21 LAB — BASIC METABOLIC PANEL
Anion gap: 8 (ref 5–15)
BUN: 21 mg/dL (ref 8–23)
CO2: 23 mmol/L (ref 22–32)
Calcium: 8.5 mg/dL — ABNORMAL LOW (ref 8.9–10.3)
Chloride: 105 mmol/L (ref 98–111)
Creatinine, Ser: 1.03 mg/dL — ABNORMAL HIGH (ref 0.44–1.00)
GFR, Estimated: 60 mL/min — ABNORMAL LOW (ref >60)
Glucose, Bld: 112 mg/dL — ABNORMAL HIGH (ref 70–99)
Potassium: 4.5 mmol/L (ref 3.5–5.1)
Sodium: 136 mmol/L (ref 135–145)

## 2021-09-21 LAB — SURGICAL PATHOLOGY

## 2021-09-21 LAB — PHOSPHORUS: Phosphorus: 3.1 mg/dL (ref 2.5–4.6)

## 2021-09-21 LAB — CBC
HCT: 24.8 % — ABNORMAL LOW (ref 36.0–46.0)
Hemoglobin: 8 g/dL — ABNORMAL LOW (ref 12.0–15.0)
MCH: 30.9 pg (ref 26.0–34.0)
MCHC: 32.3 g/dL (ref 30.0–36.0)
MCV: 95.8 fL (ref 80.0–100.0)
Platelets: 179 10*3/uL (ref 150–400)
RBC: 2.59 MIL/uL — ABNORMAL LOW (ref 3.87–5.11)
RDW: 13.9 % (ref 11.5–15.5)
WBC: 12.1 10*3/uL — ABNORMAL HIGH (ref 4.0–10.5)
nRBC: 0 % (ref 0.0–0.2)

## 2021-09-21 LAB — HEMOGLOBIN AND HEMATOCRIT, BLOOD
HCT: 22.2 % — ABNORMAL LOW (ref 36.0–46.0)
Hemoglobin: 7.2 g/dL — ABNORMAL LOW (ref 12.0–15.0)

## 2021-09-21 LAB — MAGNESIUM: Magnesium: 1.7 mg/dL (ref 1.7–2.4)

## 2021-09-21 MED ORDER — PANTOPRAZOLE SODIUM 40 MG PO TBEC
40.0000 mg | DELAYED_RELEASE_TABLET | Freq: Every day | ORAL | Status: DC
  Administered 2021-09-21 – 2021-09-23 (×3): 40 mg via ORAL
  Filled 2021-09-21 (×3): qty 1

## 2021-09-21 NOTE — Progress Notes (Signed)
1 Day Post-Op  ? ?Subjective: ?Patient states that she currently has minimal pain but reports mild weakness that she attributes to not having eaten much recently. She reports that she has been bleeding from her incision site and admits that she had been trying to pull herself up when she wanted to sit up in the bed. She states that she has been passing gas and hearing gurgling in her abdomen but has not seen any stool in her colostomy bag. She denies chest pain, shortness of breath, leg pain.  ? ?Objective: ?Vital signs in last 24 hours: ?Temp:  [97.7 ?F (36.5 ?C)-98.4 ?F (36.9 ?C)] 98 ?F (36.7 ?C) (03/02 0446) ?Pulse Rate:  [78-88] 84 (03/02 0545) ?Resp:  [11-24] 20 (03/02 0545) ?BP: (86-152)/(58-99) 113/73 (03/02 0545) ?SpO2:  [95 %-100 %] 97 % (03/02 0446) ?Last BM Date : 09/20/21 ? ?Intake/Output from previous day: ?03/01 0701 - 03/02 0700 ?In: [id number] [P.O.:240; I.V.:3255; IV Piggyback:100] ?Out: 1050 [Urine:850; Stool:150; Blood:50] ?Intake/Output this shift: ?No intake/output data recorded. ? ?Constitutional: well appearing and sitting in bed, in no acute distress ?HENT: mucous membranes moist ?Cardiovascular: skin is well perfused ?Pulmonary/Chest: normal work of breathing  ?Abdominal: soft, non-tender, non-distended, there is a 15 cm vertical incision over the mid abdomen that is slowly oozing blood and saturated the overlying bandage. Otherwise clean and intact with no obvious signs of infection. There is a colostomy bag in the LLQ that is mostly empty. The bag is clean, dry, and intact with no obvious signs of infection.  ?Extremity: No lower extremity redness, swelling, or pain with palpation. ?Neurological: alert and answering questions appropriately ? ? ? ?Lab Results:  ?Recent Labs  ?  09/18/21 ?1052 09/21/21 ?0522  ?WBC 7.8 12.1*  ?HGB 14.5 8.0*  ?HCT 42.7 24.8*  ?PLT 223 179  ? ?BMET ?Recent Labs  ?  09/18/21 ?1052 09/21/21 ?0522  ?NA 136 136  ?K 4.2 4.5  ?CL 105 105  ?CO2 27 23  ?GLUCOSE 103* 112*   ?BUN 23 21  ?CREATININE 0.90 1.03*  ?CALCIUM 9.2 8.5*  ? ?PT/INR ?No results for input(s): LABPROT, INR in the last 72 hours. ? ?Studies/Results: ?No results found. ? ?Anti-infectives: ?Anti-infectives (From admission, onward)  ? ? Start     Dose/Rate Route Frequency Ordered Stop  ? 09/20/21 0630  ertapenem (INVANZ) 1,000 mg in sodium chloride 0.9 % 100 mL IVPB       ? 1 g ?200 mL/hr over 30 Minutes Intravenous On call to O.R. 09/20/21 [id number] 09/20/21 0813  ? ?  ? ? ?Assessment/Plan: ?s/p Procedure(s): ?Small Bowel Resection ? ?Patient is on post-op day 1 from a partial small bowel resection and overall feels well. She has had mild bleeding from her incision site that led to a drop in her hemoglobin from 14.5 three days ago to 8.0 today. Additional staples were added to the incision site at bedside, and a pressure dressing was applied to control bleeding.  ? ?Plan ?- Continue IV fluids and monitor creatinine ?- Stop Lovenox given concern for bleeding from the incision site ?- Recheck hgb this afternoon to evaluate for anemia due to blood loss ?- d/c foley catheter ?- Continue Tylenol, Hydrocodone, Zofran as needed for pain and nausea ?- Advance diet to full liquids and soft, bland foods with instructions to eat slowly and in moderation ? ? LOS: 1 day  ? ? ?Brandi Rowe ?09/21/2021 ? ?

## 2021-09-21 NOTE — Progress Notes (Signed)
Patient called to nurses station requesting pain medication. Upon walking into room, patient sitting on the edge of the bed putting on new pajamas. Honeycomb dressing largely saturated with bright red blood. Large amount blood coming out of bottom of honeycomb dressing. Vitals obtained and stable. Notified Dr. Lovell Sheehan. Instructed to replace honeycomb dressing. Called the [facility] who stated we are unable to obtain honeycomb dressing at this time. ABD pads and gauze tape dressing placed on top of honeycomb dressing. Patient given full bed bath. ABD pads are clean dry and intact at this time. Dr. Lovell Sheehan made aware. No new orders at this time. Awaiting labs. Foley in place at this time.  ?

## 2021-09-22 LAB — CBC
HCT: 20 % — ABNORMAL LOW (ref 36.0–46.0)
Hemoglobin: 6.7 g/dL — CL (ref 12.0–15.0)
MCH: 31.8 pg (ref 26.0–34.0)
MCHC: 33.5 g/dL (ref 30.0–36.0)
MCV: 94.8 fL (ref 80.0–100.0)
Platelets: 119 10*3/uL — ABNORMAL LOW (ref 150–400)
RBC: 2.11 MIL/uL — ABNORMAL LOW (ref 3.87–5.11)
RDW: 14.2 % (ref 11.5–15.5)
WBC: 6.7 10*3/uL (ref 4.0–10.5)
nRBC: 0 % (ref 0.0–0.2)

## 2021-09-22 LAB — BASIC METABOLIC PANEL
Anion gap: 5 (ref 5–15)
BUN: 10 mg/dL (ref 8–23)
CO2: 25 mmol/L (ref 22–32)
Calcium: 8.3 mg/dL — ABNORMAL LOW (ref 8.9–10.3)
Chloride: 109 mmol/L (ref 98–111)
Creatinine, Ser: 0.58 mg/dL (ref 0.44–1.00)
GFR, Estimated: >60 mL/min (ref >60)
Glucose, Bld: 106 mg/dL — ABNORMAL HIGH (ref 70–99)
Potassium: 3.7 mmol/L (ref 3.5–5.1)
Sodium: 139 mmol/L (ref 135–145)

## 2021-09-22 LAB — PHOSPHORUS: Phosphorus: 2.1 mg/dL — ABNORMAL LOW (ref 2.5–4.6)

## 2021-09-22 LAB — MAGNESIUM: Magnesium: 1.7 mg/dL (ref 1.7–2.4)

## 2021-09-22 LAB — PREPARE RBC (CROSSMATCH)

## 2021-09-22 MED ORDER — SODIUM CHLORIDE 0.9% IV SOLUTION: Freq: Once | INTRAVENOUS | Status: AC

## 2021-09-22 MED ORDER — METOPROLOL TARTRATE 25 MG PO TABS
25.0000 mg | ORAL_TABLET | Freq: Two times a day (BID) | ORAL | Status: DC
  Administered 2021-09-22 – 2021-09-23 (×2): 25 mg via ORAL
  Filled 2021-09-22 (×3): qty 1

## 2021-09-22 MED ORDER — DEXTROSE 5 % IV SOLN
15.0000 mmol | Freq: Once | INTRAVENOUS | Status: AC
  Administered 2021-09-22: 15 mmol via INTRAVENOUS
  Filled 2021-09-22: qty 5

## 2021-09-22 MED ORDER — POLYETHYLENE GLYCOL 3350 17 G PO PACK
17.0000 g | PACK | Freq: Two times a day (BID) | ORAL | Status: DC
  Administered 2021-09-22: 17 g via ORAL
  Filled 2021-09-22 (×3): qty 1

## 2021-09-22 NOTE — Progress Notes (Addendum)
2 Days Post-Op  ? ?Subjective: ?Patient states that she currently feels tired and has mild diffuse abdominal pain. She states that she has been able to drink fluids but has not been able to eat much because of her pain and because she does not like the food they have given her. She states that she has been able to get up to use the bathroom and has had good urine output but has not noticed much stool in her colostomy bag. She denies chest pain, shortness of breath, leg pain. ? ? ?Objective: ?Vital signs in last 24 hours: ?Temp:  [97.8 ?F (36.6 ?C)-98.4 ?F (36.9 ?C)] 97.8 ?F (36.6 ?C) (03/03 0435) ?Pulse Rate:  [90-104] 104 (03/03 0435) ?Resp:  [15-19] 15 (03/03 0435) ?BP: (104-122)/(64-80) 122/80 (03/03 0435) ?SpO2:  [95 %-96 %] 95 % (03/03 0435) ?Last BM Date : 09/22/21 ? ?Intake/Output from previous day: ?03/02 0701 - 03/03 0700 ?In: 2533.1 [P.O.:1920; I.V.:613.1] ?Out: -  ?Intake/Output this shift: ?No intake/output data recorded. ? ?Constitutional: well appearing and sitting in bed, in no acute distress ?HENT: mucous membranes moist ?Cardiovascular: skin is well perfused ?Pulmonary/Chest: normal work of breathing  ?Abdominal: soft, mild diffuse tenderness to palpation, non-distended, there is a bandage overlying a 15 cm vertical incision over the mid abdomen that is not saturated with blood.  Otherwise clean and intact with no obvious signs of infection. There is a colostomy bag in the LLQ that is mostly empty. The bag is clean, dry, and intact with no obvious signs of infection.  ?Extremity: No lower extremity redness, swelling, or pain with palpation. ?Neurological: alert and answering questions appropriately ? ? ? ?Lab Results:  ?Recent Labs  ?  09/21/21 ?0522 09/21/21 ?1346 09/22/21 ?0543  ?WBC 12.1*  --  6.7  ?HGB 8.0* 7.2* 6.7*  ?HCT 24.8* 22.2* 20.0*  ?PLT 179  --  119*  ? ? ?BMET ?Recent Labs  ?  09/21/21 ?0522 09/22/21 ?0543  ?NA 136 139  ?K 4.5 3.7  ?CL 105 109  ?CO2 23 25  ?GLUCOSE 112* 106*  ?BUN 21  10  ?CREATININE 1.03* 0.58  ?CALCIUM 8.5* 8.3*  ? ? ?PT/INR ?No results for input(s): LABPROT, INR in the last 72 hours. ? ?Studies/Results: ?No results found. ? ?Anti-infectives: ?Anti-infectives (From admission, onward)  ? ? Start     Dose/Rate Route Frequency Ordered Stop  ? 09/20/21 0630  ertapenem (INVANZ) 1,000 mg in sodium chloride 0.9 % 100 mL IVPB       ? 1 g ?200 mL/hr over 30 Minutes Intravenous On call to O.R. 09/20/21 [id number] 09/20/21 0813  ? ?  ? ? ?Assessment/Plan: ?s/p Procedure(s): ?Small Bowel Resection ? ?Patient is on post-op day 2 from a partial small bowel resection and overall feels well with the exception of fatigue and mild abdominal pain. She had mild bleeding from her incision site yesterday that led to a drop in her hemoglobin from 14.5 four days ago, 8.0 yesterday morning, 6.7 this morning though the bleeding appears to be controlled now. ? ?Plan ?- d/c IV fluids if she is able to continue drinking  ?- Hold Lovenox given concern for bleeding causing anemia ?- Give 2 units of blood given hemoglobin 6.7  ?- Recheck hemoglobin after transfusion ?- Continue Tylenol, Hydrocodone, Zofran as needed for pain and nausea ?- Continue diet of full liquids and soft, bland foods with instructions to eat slowly and in moderation ? ? LOS: 2 days  ? ? ?Elane Fritz ?09/22/2021 ? ?

## 2021-09-22 NOTE — Care Management Important Message (Signed)
Important Message ? ?Patient Details  ?Name: Brandi Rowe ?MRN: [id number] ?Date of Birth: 03-Apr-1956 ? ? ?Medicare Important Message Given:  Yes ? ? ? ? ?Corey Harold ?09/22/2021, 4:16 PM ?

## 2021-09-22 NOTE — Progress Notes (Signed)
Pharmacy Electrolyte Replacement ? ?Recent Labs: ? ?Recent Labs  ?  09/22/21 ?0543  ?K 3.7  ?MG 1.7  ?PHOS 2.1*  ?CREATININE 0.58  ? ?Assessment: 66 year old female s/p small bowel resection POD2, advancing diet slowly. Will initiate IV Kphos replacement. ? ?Plan:  ?KPhos 15 mmol x1 ?Recheck labs in AM ? ?Thank you for allowing pharmacy to be a part of this patients medical care. ? ?Lorenso Courier, PharmD ?Clinical Pharmacist ?09/22/2021 7:53 AM ? ? ? ? ? ?

## 2021-09-22 NOTE — Progress Notes (Signed)
Patient hemoglobin 6.7 this am, MD notified, orders received.  ?

## 2021-09-23 LAB — BASIC METABOLIC PANEL
Anion gap: 8 (ref 5–15)
BUN: 6 mg/dL — ABNORMAL LOW (ref 8–23)
CO2: 25 mmol/L (ref 22–32)
Calcium: 8.4 mg/dL — ABNORMAL LOW (ref 8.9–10.3)
Chloride: 105 mmol/L (ref 98–111)
Creatinine, Ser: 0.57 mg/dL (ref 0.44–1.00)
GFR, Estimated: >60 mL/min (ref >60)
Glucose, Bld: 104 mg/dL — ABNORMAL HIGH (ref 70–99)
Potassium: 3.9 mmol/L (ref 3.5–5.1)
Sodium: 138 mmol/L (ref 135–145)

## 2021-09-23 LAB — CBC
HCT: 30 % — ABNORMAL LOW (ref 36.0–46.0)
Hemoglobin: 9.8 g/dL — ABNORMAL LOW (ref 12.0–15.0)
MCH: 30.2 pg (ref 26.0–34.0)
MCHC: 32.7 g/dL (ref 30.0–36.0)
MCV: 92.3 fL (ref 80.0–100.0)
Platelets: 126 10*3/uL — ABNORMAL LOW (ref 150–400)
RBC: 3.25 MIL/uL — ABNORMAL LOW (ref 3.87–5.11)
RDW: 14.9 % (ref 11.5–15.5)
WBC: 6.8 10*3/uL (ref 4.0–10.5)
nRBC: 0 % (ref 0.0–0.2)

## 2021-09-23 LAB — MAGNESIUM: Magnesium: 1.8 mg/dL (ref 1.7–2.4)

## 2021-09-23 LAB — PHOSPHORUS: Phosphorus: 3 mg/dL (ref 2.5–4.6)

## 2021-09-23 MED ORDER — OXYCODONE HCL 5 MG PO TABS: 5.0000 mg | ORAL_TABLET | Freq: Four times a day (QID) | ORAL | 0 refills | Status: DC | PRN

## 2021-09-23 NOTE — Progress Notes (Signed)
Nsg Discharge Note ? ?Admit Date:  09/20/2021 ?Discharge date: 09/23/2021 ?  ?Wandalee Ferdinand to be D/C'd Home per MD order.  AVS completed.   ?Patient/caregiver able to verbalize understanding. ? ?Discharge Medication: ?Allergies as of 09/23/2021   ? ?   Reactions  ? Grass Extracts [gramineae Pollens] Other (See Comments)  ? Dust, mold, mites ?Congestion  ? ?  ? ?  ?Medication List  ?  ? ?STOP taking these medications   ? ?HYDROcodone-acetaminophen 5-325 MG tablet ?Commonly known as: NORCO/VICODIN ?  ? ?  ? ?TAKE these medications   ? ?acetaminophen 500 MG tablet ?Commonly known as: TYLENOL ?Take 500 mg by mouth 2 (two) times daily as needed for moderate pain or headache. ?  ?aspirin EC 81 MG tablet ?Take 81 mg by mouth daily. ?  ?calcium carbonate 500 MG chewable tablet ?Commonly known as: TUMS - dosed in mg elemental calcium ?Chew 1 tablet by mouth daily as needed for indigestion or heartburn. ?  ?clonazePAM 1 MG tablet ?Commonly known as: KLONOPIN ?Take 1 tablet (1 mg total) by mouth daily as needed for anxiety. ?  ?diphenhydrAMINE 25 MG tablet ?Commonly known as: BENADRYL ?Take 25 mg by mouth at bedtime as needed for allergies. ?  ?ferrous sulfate 325 (65 FE) MG EC tablet ?Take 1 tablet (325 mg total) by mouth 2 (two) times daily. ?What changed: when to take this ?  ?fluticasone 50 MCG/ACT nasal spray ?Commonly known as: FLONASE ?Place 2 sprays into both nostrils daily as needed for allergies. ?  ?folic acid A999333 MCG tablet ?Commonly known as: FOLVITE ?Take 400 mcg by mouth every other day. ?  ?metoprolol tartrate 25 MG tablet ?Commonly known as: LOPRESSOR ?Take 1 tablet (25 mg total) by mouth 2 (two) times daily. ?  ?oxyCODONE 5 MG immediate release tablet ?Commonly known as: Roxicodone ?Take 1 tablet (5 mg total) by mouth every 6 (six) hours as needed. ?  ?REFRESH OP ?Place 1 drop into both eyes daily as needed (dry eyes). ?  ?vitamin B-12 1000 MCG tablet ?Commonly known as: CYANOCOBALAMIN ?Take 1 tablet (1,000 mcg  total) by mouth daily. ?What changed: when to take this ?  ? ?  ? ? ?Discharge Assessment: ?Vitals:  ? 09/23/21 0420 09/23/21 0929  ?BP: 134/88 (!) 154/90  ?Pulse: 72 74  ?Resp: 17   ?Temp: 98.7 ?F (37.1 ?C)   ?SpO2: 96%   ? Skin clean, dry and intact without evidence of skin break down, no evidence of skin tears noted. ?IV catheter discontinued intact. Site without signs and symptoms of complications - no redness or edema noted at insertion site, patient denies c/o pain - only slight tenderness at site.  Dressing with slight pressure applied. ? ?D/c Instructions-Education: ?Discharge instructions given to patient/family with verbalized understanding. ?D/c education completed with patient/family including follow up instructions, medication list, d/c activities limitations if indicated, with other d/c instructions as indicated by MD - patient able to verbalize understanding, all questions fully answered. ?Patient instructed to return to ED, call 911, or call MD for any changes in condition.  ?Patient escorted via [place], and D/C home via private auto. ? ?Kathie Rhodes, RN ?09/23/2021 12:02 PM  ?

## 2021-09-23 NOTE — Discharge Summary (Signed)
Physician Discharge Summary  ?Patient ID: ?Brandi Rowe ?MRN: [id number] ?DOB/AGE: 67-Nov-1957 67 y.o. ? ?Admit date: 09/20/2021 ?Discharge date: 09/23/2021 ? ?Admission Diagnoses: ? ?Discharge Diagnoses:  ?Principal Problem: ?  S/P small bowel resection ?Active Problems: ?  Lower abdominal adhesions ? ? ?Discharged Condition: stable ? ?Hospital Course: Patient is a 66 year old female who was admitted status post exploratory laparotomy with small bowel resection and unable to reverse colostomy.  Postoperatively, she had a drop in her hemoglobin which trended down to 6.7.  She subsequently received 2 units of PRBCs with her hemoglobin responding back up to 9.8.  She is tolerating her diet without nausea and vomiting.  She has return of bowel function of her colostomy.  Her pain is adequately controlled with oral medications.  She is stable for discharge home today.  She will follow-up with Dr. Arnoldo Morale in a week for evaluation after hospitalization and for staple removal. ? ?Consults: None ? ?Significant Diagnostic Studies: Patient's hemoglobin drawn drifted to 6.7 postoperatively, repeat hemoglobin after receiving 2 units of blood was 9.8 ? ?Treatments: surgery: Exploratory laparotomy with small bowel resection ? ?Discharge Exam: ?Blood pressure (!) 154/90, pulse 74, temperature 98.7 ?F (37.1 ?C), resp. rate 17, height 5\' 4"  (1.626 m), weight 49.9 kg, SpO2 96 %. ?General appearance: alert, cooperative, and no distress ?GI: Abdomen soft, nondistended, minimal tenderness, left sided ostomy with brown stool in bag, midline incision C/D/I with no blood on old dressing, mild ecchymosis without active bleeding. ? ?Disposition: Discharge disposition: 01-Home or Self Care ? ? ? ? ? ? ?Discharge Instructions   ? ? Call MD for:  difficulty breathing, headache or visual disturbances   Complete by: As directed ?  ? Call MD for:  extreme fatigue   Complete by: As directed ?  ? Call MD for:  hives   Complete by: As directed ?  ? Call  MD for:  persistant dizziness or light-headedness   Complete by: As directed ?  ? Call MD for:  persistant nausea and vomiting   Complete by: As directed ?  ? Call MD for:  redness, tenderness, or signs of infection (pain, swelling, redness, odor or green/yellow discharge around incision site)   Complete by: As directed ?  ? Call MD for:  severe uncontrolled pain   Complete by: As directed ?  ? Call MD for:  temperature >100.4   Complete by: As directed ?  ? Diet - low sodium heart healthy   Complete by: As directed ?  ? Increase activity slowly   Complete by: As directed ?  ? ?  ? ?Allergies as of 09/23/2021   ? ?   Reactions  ? Grass Extracts [gramineae Pollens] Other (See Comments)  ? Dust, mold, mites ?Congestion  ? ?  ? ?  ?Medication List  ?  ? ?STOP taking these medications   ? ?HYDROcodone-acetaminophen 5-325 MG tablet ?Commonly known as: NORCO/VICODIN ?  ? ?  ? ?TAKE these medications   ? ?acetaminophen 500 MG tablet ?Commonly known as: TYLENOL ?Take 500 mg by mouth 2 (two) times daily as needed for moderate pain or headache. ?  ?aspirin EC 81 MG tablet ?Take 81 mg by mouth daily. ?  ?calcium carbonate 500 MG chewable tablet ?Commonly known as: TUMS - dosed in mg elemental calcium ?Chew 1 tablet by mouth daily as needed for indigestion or heartburn. ?  ?clonazePAM 1 MG tablet ?Commonly known as: KLONOPIN ?Take 1 tablet (1 mg total) by mouth daily as  needed for anxiety. ?  ?diphenhydrAMINE 25 MG tablet ?Commonly known as: BENADRYL ?Take 25 mg by mouth at bedtime as needed for allergies. ?  ?ferrous sulfate 325 (65 FE) MG EC tablet ?Take 1 tablet (325 mg total) by mouth 2 (two) times daily. ?What changed: when to take this ?  ?fluticasone 50 MCG/ACT nasal spray ?Commonly known as: FLONASE ?Place 2 sprays into both nostrils daily as needed for allergies. ?  ?folic acid A999333 MCG tablet ?Commonly known as: FOLVITE ?Take 400 mcg by mouth every other day. ?  ?metoprolol tartrate 25 MG tablet ?Commonly known as:  LOPRESSOR ?Take 1 tablet (25 mg total) by mouth 2 (two) times daily. ?  ?oxyCODONE 5 MG immediate release tablet ?Commonly known as: Roxicodone ?Take 1 tablet (5 mg total) by mouth every 6 (six) hours as needed. ?  ?REFRESH OP ?Place 1 drop into both eyes daily as needed (dry eyes). ?  ?vitamin B-12 1000 MCG tablet ?Commonly known as: CYANOCOBALAMIN ?Take 1 tablet (1,000 mcg total) by mouth daily. ?What changed: when to take this ?  ? ?  ? ? Follow-up Information   ? ? Aviva Signs, MD. Schedule an appointment as soon as possible for a visit on 10/03/2021.   ?Specialty: General Surgery ?Contact information: ?[address] ?[place] [id number] ?[phone] ? ? ?  ?  ? ?  ?  ? ?  ? ? ?Signed: ?Megann Easterwood A Saarah Dewing ?09/23/2021, 11:27 AM ? ? ?

## 2021-09-23 NOTE — Progress Notes (Signed)
The patient is injury-free, afebrile, alert, and oriented X 4. Vital signs were within the baseline during this shift. She complained of abdominal pain, which controlled with the current pain regimen. Pt was transfused with 2 units of pRBC with s/s of transfusion reaction. Pt denies chest pain, SOB, nausea, vomiting, dizziness, signs or symptoms of bleeding or infection, or acute changes during this shift. We will continue to monitor and work toward achieving the care plan goals. ?

## 2021-09-23 NOTE — Discharge Instructions (Signed)
Surgery Discharge Instructions ? ?Activity ? You are advised to go directly home from the hospital.  Resume light activity. No heavy lifting over 10 lbs or strenuous exercise. ? ?Fluids and Diet ?Regular diet ? ?Medications ? If you have not had a bowel movement in 24 hours, take 2 tablespoons over the counter Milk of mag. ?            You May resume your blood thinners tomorrow (Aspirin, coumadin, or other).  ?You are being discharged with prescriptions for Opioid/Narcotic Medications: There are some specific considerations for these medications that you should know. ?Opioid Meds have risks & benefits. Addiction to these meds is always a concern with prolonged use ?Take medication only as directed ?Do not drive while taking narcotic pain medication ?Do not crush tablets or capsules ?Do not use a different container than medication was dispensed in ?Lock the container of medication in a cool, dry place out of reach of children and pets. ?Opioid medication can cause addiction ?Do not share with anyone else (this is a felony) ?Do not store medications for future use. Dispose of them properly. ?    Disposal:  ?Find a [employer] household drug take back site near you.  ?If you can't get to a drug take back site, use the recipe below as a last resort to dispose of expired, unused or unwanted drugs. ?Disposal  ?(Do not dispose chemotherapy drugs this way, talk to your prescribing doctor instead.) Step 1: Mix drugs (do not crush) with dirt, kitty litter, or used coffee grounds and add a small amount of water to dissolve any solid medications. Step 2: Seal drugs in plastic bag. Step 3: Place plastic bag in trash. Step 4: Take prescription container and scratch out personal information, then recycle or throw away. ? ?Operative Site ? You have skin staples on your incision.  These will be removed in office. Ok to shower. Keep wound clean and dry. No baths or swimming. No lifting more than 10 pounds.    ? ?Contact  Information: ?If you have questions or concerns, please call our office, [phone], Monday- Thursday 8AM-5PM and Friday 8AM-12Noon.  ?If it is after hours or on the weekend, please call [facility], [phone], and ask to speak to the surgeon on call for Dr. Lovell Sheehan at [facility].  ? ?SPECIFIC COMPLICATIONS TO WATCH FOR: ?Inability to urinate ?Fever over 101? F by mouth ?Nausea and vomiting lasting longer than 24 hours. ?Pain not relieved by medication ordered ?Swelling around the operative site ?Increased redness, warmth, hardness, around operative area ?Numbness, tingling, or cold fingers or toes ?Blood -soaked dressing, (small amounts of oozing may be normal) ?Increasing and progressive drainage from surgical area or exam site ? ?

## 2021-09-23 NOTE — Progress Notes (Addendum)
No H&H order found for pRBC post-transfusion. Hold H&H lab till morning labs at 500 by Dr. Lovell Sheehan ?

## 2021-09-23 NOTE — Plan of Care (Signed)

## 2021-09-24 LAB — BPAM RBC
Blood Product Expiration Date: 202304092359
Blood Product Expiration Date: 202304092359
ISSUE DATE / TIME: 202303031151
ISSUE DATE / TIME: 202303031523
Unit Type and Rh: 5100
Unit Type and Rh: 5100

## 2021-09-24 LAB — TYPE AND SCREEN
ABO/RH(D): B POS
Antibody Screen: NEGATIVE
Unit division: 0
Unit division: 0

## 2021-09-25 ENCOUNTER — Encounter (HOSPITAL_COMMUNITY): Payer: Self-pay | Admitting: General Surgery

## 2021-09-28 DIAGNOSIS — Z933 Colostomy status: Secondary | ICD-10-CM | POA: Diagnosis not present

## 2021-09-28 DIAGNOSIS — K5781 Diverticulitis of intestine, part unspecified, with perforation and abscess with bleeding: Secondary | ICD-10-CM | POA: Diagnosis not present

## 2021-10-02 ENCOUNTER — Telehealth: Payer: Self-pay | Admitting: Internal Medicine

## 2021-10-02 MED ORDER — METOPROLOL TARTRATE 50 MG PO TABS: 50.0000 mg | ORAL_TABLET | Freq: Two times a day (BID) | ORAL | 3 refills | Status: DC

## 2021-10-02 NOTE — Telephone Encounter (Signed)
?  Patient returning call regarding monitor results ?

## 2021-10-02 NOTE — Telephone Encounter (Signed)
-----   Message from Imogene Burn, PA-C sent at 10/02/2021  8:49 AM EDT ----- ?Patient's monitor shows a lot of SVT-fast heart beats from the top of her heart and one from the bottom chambers. Please increase metoprolol 50 mg bid and ask her to keep track of her HR at home. Avoid caffeine, decongestants. No Afib on monitor. thanks ?

## 2021-10-02 NOTE — Telephone Encounter (Signed)
Pt notified of results and verbalized understanding. Pt had no questions or concerns at this time.

## 2021-10-03 ENCOUNTER — Encounter: Payer: Self-pay | Admitting: General Surgery

## 2021-10-03 ENCOUNTER — Other Ambulatory Visit: Payer: Self-pay

## 2021-10-03 ENCOUNTER — Ambulatory Visit (INDEPENDENT_AMBULATORY_CARE_PROVIDER_SITE_OTHER): Payer: Medicare Other | Admitting: General Surgery

## 2021-10-03 VITALS — BP 173/98 | HR 64 | Temp 97.8°F | Resp 14 | Ht 64.0 in | Wt 113.0 lb

## 2021-10-03 DIAGNOSIS — Z09 Encounter for follow-up examination after completed treatment for conditions other than malignant neoplasm: Secondary | ICD-10-CM

## 2021-10-03 NOTE — Progress Notes (Signed)
Subjective:  ?  ? Brandi Rowe  ?Here for postoperative visit, status post exploratory laparotomy, partial small bowel resection, and attempted colostomy reversal.  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  Patient has been doing well.  She is depressed that she was not reversed. ?Objective:  ? ? BP (!) 173/98   Pulse 64   Temp 97.8 ?F (36.6 ?C) (Other (Comment))   Resp 14   Ht 5\' 4"  (1.626 m)   Wt 113 lb (51.3 kg)   SpO2 96%   BMI 19.40 kg/m?  ? ?General:  alert, cooperative, and no distress  ?Abdomen soft, incision well-healed.  Staples removed. ?Final pathology discussed with patient. ?   ? ?Assessment:  ? ? Doing well postoperatively.  ?  ?Plan:  ? ?She is going away for 2 months to [facility] for rest and relaxation.  She would like a second opinion from a [facility] into the possibility of being reversed.  I told her I would discuss that with general surgery in [place] upon her return.  She will call to schedule that appointment. ?

## 2021-10-18 ENCOUNTER — Ambulatory Visit: Payer: Medicare Other | Admitting: Urology

## 2021-10-25 ENCOUNTER — Ambulatory Visit: Payer: Medicare Other | Admitting: Urology

## 2021-11-02 ENCOUNTER — Encounter: Payer: Self-pay | Admitting: General Surgery

## 2021-11-20 ENCOUNTER — Other Ambulatory Visit: Payer: Self-pay

## 2021-11-20 ENCOUNTER — Other Ambulatory Visit: Payer: Self-pay | Admitting: Nurse Practitioner

## 2021-11-20 DIAGNOSIS — F410 Panic disorder [episodic paroxysmal anxiety] without agoraphobia: Secondary | ICD-10-CM

## 2021-11-20 NOTE — Progress Notes (Signed)
Refuse refill, not Dr. Caren Macadam pt.  ?

## 2021-12-15 ENCOUNTER — Ambulatory Visit: Payer: Medicare Other | Admitting: Internal Medicine

## 2021-12-28 DIAGNOSIS — R7301 Impaired fasting glucose: Secondary | ICD-10-CM | POA: Diagnosis not present

## 2021-12-28 DIAGNOSIS — E538 Deficiency of other specified B group vitamins: Secondary | ICD-10-CM | POA: Diagnosis not present

## 2021-12-28 DIAGNOSIS — E559 Vitamin D deficiency, unspecified: Secondary | ICD-10-CM | POA: Diagnosis not present

## 2021-12-28 DIAGNOSIS — I10 Essential (primary) hypertension: Secondary | ICD-10-CM | POA: Diagnosis not present

## 2021-12-28 DIAGNOSIS — D649 Anemia, unspecified: Secondary | ICD-10-CM | POA: Diagnosis not present

## 2021-12-28 DIAGNOSIS — I700 Atherosclerosis of aorta: Secondary | ICD-10-CM | POA: Diagnosis not present

## 2022-01-01 ENCOUNTER — Ambulatory Visit: Payer: Medicare Other | Admitting: Internal Medicine

## 2022-01-01 ENCOUNTER — Encounter: Payer: Self-pay | Admitting: Internal Medicine

## 2022-01-01 VITALS — BP 160/80 | HR 68 | Ht 63.0 in | Wt 104.0 lb

## 2022-01-01 DIAGNOSIS — I10 Essential (primary) hypertension: Secondary | ICD-10-CM | POA: Diagnosis not present

## 2022-01-01 DIAGNOSIS — I480 Paroxysmal atrial fibrillation: Secondary | ICD-10-CM | POA: Diagnosis not present

## 2022-01-01 DIAGNOSIS — I471 Supraventricular tachycardia: Secondary | ICD-10-CM | POA: Diagnosis not present

## 2022-01-01 NOTE — Progress Notes (Signed)
OFFICE NOTE  Chief Complaint:  Follow-up SVT  Primary Care Physician: Benita Stabile, MD  HPI:  Brandi Rowe is a 66 y.o. female with a past medial history significant for recent palpitations and PAF in the setting of SBO with electrolyte abnormalities.  I saw her in the hospital.  At that time anticoagulation was not recommended due to recent surgery and the fact that she has an ostomy which needed to be reversed.  Subsequently, she was seen in follow-up by Jacolyn Reedy, PA-C in February 2023. Instructed to wear a 2 week monitor which showed numerous short runs of SVT and a 4 beat run of NSVT, but no A-fib. She was instructed to increase her metoprolol to 50 mg BID. She is currently on aspirin, which she started on her own. Her CHADSVASC score is 3 (Female, age >50, hypertension). She has not yet had surgery for ostomy reversal.  PMHx:  Past Medical History:  Diagnosis Date   Acute febrile illness 01/04/2021   Anxiety    Colovaginal fistula    Colovesical fistula    a. 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   Dehydration 01/04/2021   Dysrhythmia    Elevated MCV 01/04/2021   Gout    Hypertension    Nausea 01/04/2021   PAF (paroxysmal atrial fibrillation) (HCC)    a. 06/2021 in setting of SBO, HypoK/Mg; b. 06/2021 Echo: EF 50-55%, no rwma, GrI DD, Nl RV size/fxn, RVSP 36.70mmHg. Mod dil RA. Triv MR.   Sepsis (HCC) 01/05/2021   Tobacco abuse     Past Surgical History:  Procedure Laterality Date   BLADDER REPAIR N/A 05/17/2021   Procedure: Closure of Cystotomy;  Surgeon: Malen Gauze, MD;  Location: [facility];  Service: Urology;  Laterality: N/A;   CARPAL TUNNEL RELEASE Right 2002   COLONOSCOPY WITH PROPOFOL N/A 09/19/2021   Procedure: COLONOSCOPY WITH PROPOFOL;  Surgeon: Franky Macho, MD;  Location: [facility];  Service: Gastroenterology;  Laterality: N/A;   COLOSTOMY REVERSAL N/A 09/20/2021   Procedure: Small Bowel Resection;  Surgeon: Franky Macho, MD;   Location: [facility];  Service: General;  Laterality: N/A;   MANDIBLE SURGERY  1985   PARTIAL COLECTOMY N/A 05/17/2021   Procedure: PARTIAL COLECTOMY WITH COLOSTOMY;  Surgeon: Franky Macho, MD;  Location: [facility];  Service: General;  Laterality: N/A;   PELVIC LYMPH NODE DISSECTION N/A 05/17/2021   Procedure: PELVIC DISSECTION;  Surgeon: Lazaro Arms, MD;  Location: [facility];  Service: Gynecology;  Laterality: N/A;    FAMHx:  Family History  Problem Relation Age of Onset   Congestive Heart Failure Mother    Uterine cancer Mother    Pancreatic cancer Father    Atrial fibrillation Brother    Stroke Maternal Grandmother    Heart disease Maternal Grandmother    Glaucoma Maternal Grandfather     SOCHx:   reports that she has been smoking cigarettes. She has a 23.00 pack-year smoking history. She has never used smokeless tobacco. She reports current alcohol use of about 7.0 standard drinks of alcohol per week. She reports that she does not use drugs.  ALLERGIES:  Allergies  Allergen Reactions   Grass Extracts [Gramineae Pollens] Other (See Comments)    Dust, mold, mites Congestion    ROS: Pertinent items noted in HPI and remainder of comprehensive ROS otherwise negative.  HOME MEDS: Current Outpatient Medications on File Prior to Visit  Medication Sig Dispense Refill   acetaminophen (TYLENOL) 500 MG tablet Take 500  mg by mouth 2 (two) times daily as needed for moderate pain or headache.     aspirin EC 81 MG tablet Take 81 mg by mouth daily.     calcium carbonate (TUMS - DOSED IN MG ELEMENTAL CALCIUM) 500 MG chewable tablet Chew 1 tablet by mouth daily as needed for indigestion or heartburn.     clonazePAM (KLONOPIN) 1 MG tablet Take 1 tablet (1 mg total) by mouth daily as needed for anxiety. 60 tablet 0   diphenhydrAMINE (BENADRYL) 25 MG tablet Take 25 mg by mouth at bedtime as needed for allergies.     ferrous sulfate 325 (65 FE) MG EC tablet Take 1 tablet (325 mg total) by mouth 2  (two) times daily. (Patient taking differently: Take 325 mg by mouth every other day.) 60 tablet 3   fluticasone (FLONASE) 50 MCG/ACT nasal spray Place 2 sprays into both nostrils daily as needed for allergies.     folic acid (FOLVITE) A999333 MCG tablet Take 400 mcg by mouth every other day.     metoprolol tartrate (LOPRESSOR) 50 MG tablet Take 1 tablet (50 mg total) by mouth 2 (two) times daily. 180 tablet 3   Polyvinyl Alcohol-Povidone (REFRESH OP) Place 1 drop into both eyes daily as needed (dry eyes).     vitamin B-12 (CYANOCOBALAMIN) 1000 MCG tablet Take 1 tablet (1,000 mcg total) by mouth daily. (Patient taking differently: Take 1,000 mcg by mouth every other day.) 30 tablet 0   No current facility-administered medications on file prior to visit.    LABS/IMAGING: No results found for this or any previous visit (from the past 48 hour(s)). No results found.  LIPID PANEL:    Component Value Date/Time   CHOL 201 (H) 07/18/2021 0215   TRIG 206 (H) 07/18/2021 0215   HDL 78 07/18/2021 0215   CHOLHDL 2.6 07/18/2021 0215   VLDL 41 (H) 07/18/2021 0215   LDLCALC 82 07/18/2021 0215   LDLCALC 101 (H) 09/24/2018 0924     WEIGHTS: Wt Readings from Last 3 Encounters:  01/01/22 104 lb (47.2 kg)  10/03/21 113 lb (51.3 kg)  09/20/21 110 lb 0.2 oz (49.9 kg)    VITALS: BP (!) 160/80   Pulse 68   Ht 5\' 3"  (1.6 m)   Wt 104 lb (47.2 kg)   SpO2 98%   BMI 18.42 kg/m   EXAM: General appearance: alert, no distress, and thin Lungs: clear to auscultation bilaterally Heart: regular rate and rhythm Extremities: extremities normal, atraumatic, no cyanosis or edema Neurologic: Grossly normal  EKG: N/A  ASSESSMENT: History of PAF, postoperative-CHA2DS2-VASc score 3 Recent paroxysmal SVT and short run of NSVT, no A-fib recurrent Not currently anticoagulated due to need for upcoming ostomy reversal Hypertension  PLAN: 1.   Ms. Erbacher has a history of PAF which was noted postoperatively but  has not had any recurrence that were aware of.  She wore a 2-week monitor that demonstrated multiple episodes of SVT and a short run of NSVT, but no recurrent A-fib.  She is on aspirin although there is no real benefit for stroke risk reduction and therefore I would advise stopping it.  She is not currently anticoagulated as her episode of A-fib was less than 24 hours and does not appear to have recurred.  This was also postoperative.  I advised her to purchase a cardia mobile device to see if she could monitor her episodes at home and if she has some recurrent A-fib, we will need to consider starting  anticoagulation.  Also, she should monitor blood pressures at home and reach out to [place] if her blood pressure remains persistently elevated greater than 140/85.  Follow-up with me in 6 months.  Pixie Casino, MD, [facility], [place] Director of the Advanced Lipid Disorders &  Cardiovascular Risk Reduction Clinic Diplomate of the American Board of Clinical Lipidology Attending Cardiologist  Direct Dial: [phone]  Fax: [phone]  Website:  [email or web address] Maciej Schweitzer 01/01/2022, 1:02 PM

## 2022-01-01 NOTE — Patient Instructions (Addendum)
Medication Instructions:    STOP Aspirin  Labwork: None   Testing/Procedures: none  Follow-Up: 6 months  Any Other Special Instructions Will Be Listed Below (If Applicable).   [email or web address]  -  portable heart monitor  If you need a refill on your cardiac medications before your next appointment, please call your pharmacy.

## 2022-01-02 DIAGNOSIS — E559 Vitamin D deficiency, unspecified: Secondary | ICD-10-CM | POA: Diagnosis not present

## 2022-01-02 DIAGNOSIS — I700 Atherosclerosis of aorta: Secondary | ICD-10-CM | POA: Diagnosis not present

## 2022-01-02 DIAGNOSIS — I4891 Unspecified atrial fibrillation: Secondary | ICD-10-CM | POA: Diagnosis not present

## 2022-01-02 DIAGNOSIS — Z0001 Encounter for general adult medical examination with abnormal findings: Secondary | ICD-10-CM | POA: Diagnosis not present

## 2022-01-02 DIAGNOSIS — Z9049 Acquired absence of other specified parts of digestive tract: Secondary | ICD-10-CM | POA: Diagnosis not present

## 2022-01-02 DIAGNOSIS — N321 Vesicointestinal fistula: Secondary | ICD-10-CM | POA: Diagnosis not present

## 2022-01-02 DIAGNOSIS — D649 Anemia, unspecified: Secondary | ICD-10-CM | POA: Diagnosis not present

## 2022-01-02 DIAGNOSIS — E538 Deficiency of other specified B group vitamins: Secondary | ICD-10-CM | POA: Diagnosis not present

## 2022-01-02 DIAGNOSIS — I10 Essential (primary) hypertension: Secondary | ICD-10-CM | POA: Diagnosis not present

## 2022-01-04 ENCOUNTER — Telehealth: Payer: Self-pay | Admitting: *Deleted

## 2022-01-04 DIAGNOSIS — Z9049 Acquired absence of other specified parts of digestive tract: Secondary | ICD-10-CM

## 2022-01-04 DIAGNOSIS — K5732 Diverticulitis of large intestine without perforation or abscess without bleeding: Secondary | ICD-10-CM

## 2022-01-04 DIAGNOSIS — K5730 Diverticulosis of large intestine without perforation or abscess without bleeding: Secondary | ICD-10-CM

## 2022-01-04 DIAGNOSIS — Z933 Colostomy status: Secondary | ICD-10-CM

## 2022-01-04 DIAGNOSIS — K660 Peritoneal adhesions (postprocedural) (postinfection): Secondary | ICD-10-CM

## 2022-01-04 DIAGNOSIS — N321 Vesicointestinal fistula: Secondary | ICD-10-CM

## 2022-01-04 MED ORDER — ONDANSETRON HCL 4 MG PO TABS: 4.0000 mg | ORAL_TABLET | Freq: Three times a day (TID) | ORAL | 0 refills | Status: DC | PRN

## 2022-01-04 NOTE — Telephone Encounter (Signed)
Received call from patient.   Reports increased nausea and requested medication. Prescription sent to pharmacy for Zofran.   Also states that she would like to begin referral for colostomy reversal. Dr. Lovell Sheehan recommends referral be sent to Mirian Mo, MD. Referral orders placed.  [facility]~ Graylin Shiver. Byrd Hesselbach, MD, [facility] Surgical Oncology, Colon and Rectal Surgery, General Surgery [phone] [address] [place], [place] [address] [phone] telephone/ [phone] fax

## 2022-01-08 DIAGNOSIS — H43813 Vitreous degeneration, bilateral: Secondary | ICD-10-CM | POA: Diagnosis not present

## 2022-01-20 DIAGNOSIS — Z933 Colostomy status: Secondary | ICD-10-CM | POA: Diagnosis not present

## 2022-02-06 DIAGNOSIS — K5792 Diverticulitis of intestine, part unspecified, without perforation or abscess without bleeding: Secondary | ICD-10-CM | POA: Diagnosis not present

## 2022-02-07 DIAGNOSIS — H43393 Other vitreous opacities, bilateral: Secondary | ICD-10-CM | POA: Diagnosis not present

## 2022-02-07 DIAGNOSIS — H2513 Age-related nuclear cataract, bilateral: Secondary | ICD-10-CM | POA: Diagnosis not present

## 2022-02-07 DIAGNOSIS — G43109 Migraine with aura, not intractable, without status migrainosus: Secondary | ICD-10-CM | POA: Diagnosis not present

## 2022-02-20 ENCOUNTER — Ambulatory Visit: Payer: Medicare Other | Admitting: General Surgery

## 2022-03-21 DIAGNOSIS — Z933 Colostomy status: Secondary | ICD-10-CM | POA: Diagnosis not present

## 2022-03-23 DIAGNOSIS — Z23 Encounter for immunization: Secondary | ICD-10-CM | POA: Diagnosis not present

## 2022-05-03 DIAGNOSIS — Z01818 Encounter for other preprocedural examination: Secondary | ICD-10-CM | POA: Diagnosis not present

## 2022-05-03 DIAGNOSIS — K219 Gastro-esophageal reflux disease without esophagitis: Secondary | ICD-10-CM | POA: Diagnosis not present

## 2022-05-03 DIAGNOSIS — I10 Essential (primary) hypertension: Secondary | ICD-10-CM | POA: Diagnosis not present

## 2022-05-03 DIAGNOSIS — J309 Allergic rhinitis, unspecified: Secondary | ICD-10-CM | POA: Diagnosis not present

## 2022-05-03 DIAGNOSIS — Z79899 Other long term (current) drug therapy: Secondary | ICD-10-CM | POA: Diagnosis not present

## 2022-05-03 DIAGNOSIS — Z433 Encounter for attention to colostomy: Secondary | ICD-10-CM | POA: Diagnosis not present

## 2022-05-03 DIAGNOSIS — D649 Anemia, unspecified: Secondary | ICD-10-CM | POA: Diagnosis not present

## 2022-05-03 DIAGNOSIS — F17210 Nicotine dependence, cigarettes, uncomplicated: Secondary | ICD-10-CM | POA: Diagnosis not present

## 2022-05-03 DIAGNOSIS — K5792 Diverticulitis of intestine, part unspecified, without perforation or abscess without bleeding: Secondary | ICD-10-CM | POA: Diagnosis not present

## 2022-05-07 DIAGNOSIS — G8918 Other acute postprocedural pain: Secondary | ICD-10-CM | POA: Diagnosis not present

## 2022-05-07 DIAGNOSIS — J309 Allergic rhinitis, unspecified: Secondary | ICD-10-CM | POA: Diagnosis not present

## 2022-05-07 DIAGNOSIS — D649 Anemia, unspecified: Secondary | ICD-10-CM | POA: Diagnosis not present

## 2022-05-07 DIAGNOSIS — K219 Gastro-esophageal reflux disease without esophagitis: Secondary | ICD-10-CM | POA: Diagnosis not present

## 2022-05-07 DIAGNOSIS — Z433 Encounter for attention to colostomy: Secondary | ICD-10-CM | POA: Diagnosis not present

## 2022-05-07 DIAGNOSIS — I10 Essential (primary) hypertension: Secondary | ICD-10-CM | POA: Diagnosis not present

## 2022-05-07 DIAGNOSIS — Z79899 Other long term (current) drug therapy: Secondary | ICD-10-CM | POA: Diagnosis not present

## 2022-05-07 DIAGNOSIS — F17210 Nicotine dependence, cigarettes, uncomplicated: Secondary | ICD-10-CM | POA: Diagnosis not present

## 2022-05-07 DIAGNOSIS — I4891 Unspecified atrial fibrillation: Secondary | ICD-10-CM | POA: Diagnosis not present

## 2022-05-07 DIAGNOSIS — K660 Peritoneal adhesions (postprocedural) (postinfection): Secondary | ICD-10-CM | POA: Diagnosis not present

## 2022-05-07 DIAGNOSIS — K5711 Diverticulosis of small intestine without perforation or abscess with bleeding: Secondary | ICD-10-CM | POA: Diagnosis not present

## 2022-05-07 DIAGNOSIS — K5792 Diverticulitis of intestine, part unspecified, without perforation or abscess without bleeding: Secondary | ICD-10-CM | POA: Diagnosis not present

## 2022-05-22 ENCOUNTER — Ambulatory Visit: Payer: Medicare Other | Admitting: Physician Assistant

## 2022-05-25 ENCOUNTER — Inpatient Hospital Stay (HOSPITAL_COMMUNITY)
Admission: EM | Admit: 2022-05-25 | Discharge: 2022-05-27 | DRG: 683 | Disposition: A | Discharge status: Home / Self Care | Payer: Medicare Other | Attending: Internal Medicine | Admitting: Internal Medicine

## 2022-05-25 ENCOUNTER — Other Ambulatory Visit: Payer: Self-pay

## 2022-05-25 ENCOUNTER — Encounter (HOSPITAL_COMMUNITY): Payer: Self-pay | Admitting: *Deleted

## 2022-05-25 DIAGNOSIS — Z9049 Acquired absence of other specified parts of digestive tract: Secondary | ICD-10-CM

## 2022-05-25 DIAGNOSIS — K219 Gastro-esophageal reflux disease without esophagitis: Secondary | ICD-10-CM | POA: Diagnosis present

## 2022-05-25 DIAGNOSIS — M109 Gout, unspecified: Secondary | ICD-10-CM | POA: Diagnosis not present

## 2022-05-25 DIAGNOSIS — E876 Hypokalemia: Secondary | ICD-10-CM | POA: Diagnosis present

## 2022-05-25 DIAGNOSIS — E869 Volume depletion, unspecified: Secondary | ICD-10-CM | POA: Diagnosis not present

## 2022-05-25 DIAGNOSIS — Z79899 Other long term (current) drug therapy: Secondary | ICD-10-CM | POA: Diagnosis not present

## 2022-05-25 DIAGNOSIS — E860 Dehydration: Secondary | ICD-10-CM | POA: Diagnosis present

## 2022-05-25 DIAGNOSIS — I10 Essential (primary) hypertension: Secondary | ICD-10-CM | POA: Diagnosis not present

## 2022-05-25 DIAGNOSIS — Z8249 Family history of ischemic heart disease and other diseases of the circulatory system: Secondary | ICD-10-CM | POA: Diagnosis not present

## 2022-05-25 DIAGNOSIS — N179 Acute kidney failure, unspecified: Principal | ICD-10-CM | POA: Diagnosis present

## 2022-05-25 DIAGNOSIS — R198 Other specified symptoms and signs involving the digestive system and abdomen: Secondary | ICD-10-CM

## 2022-05-25 DIAGNOSIS — F419 Anxiety disorder, unspecified: Secondary | ICD-10-CM | POA: Diagnosis present

## 2022-05-25 DIAGNOSIS — E871 Hypo-osmolality and hyponatremia: Secondary | ICD-10-CM | POA: Diagnosis not present

## 2022-05-25 DIAGNOSIS — F17210 Nicotine dependence, cigarettes, uncomplicated: Secondary | ICD-10-CM | POA: Diagnosis not present

## 2022-05-25 DIAGNOSIS — I480 Paroxysmal atrial fibrillation: Secondary | ICD-10-CM | POA: Diagnosis not present

## 2022-05-25 DIAGNOSIS — Z91048 Other nonmedicinal substance allergy status: Secondary | ICD-10-CM | POA: Diagnosis not present

## 2022-05-25 DIAGNOSIS — R531 Weakness: Secondary | ICD-10-CM | POA: Diagnosis not present

## 2022-05-25 DIAGNOSIS — Z932 Ileostomy status: Secondary | ICD-10-CM

## 2022-05-25 DIAGNOSIS — R110 Nausea: Secondary | ICD-10-CM

## 2022-05-25 DIAGNOSIS — F17200 Nicotine dependence, unspecified, uncomplicated: Secondary | ICD-10-CM | POA: Diagnosis present

## 2022-05-25 LAB — URINALYSIS, ROUTINE W REFLEX MICROSCOPIC
Bilirubin Urine: NEGATIVE
Glucose, UA: NEGATIVE mg/dL
Ketones, ur: NEGATIVE mg/dL
Leukocytes,Ua: NEGATIVE
Nitrite: NEGATIVE
Protein, ur: NEGATIVE mg/dL
Specific Gravity, Urine: 1.01 (ref 1.005–1.030)
pH: 5 (ref 5.0–8.0)

## 2022-05-25 LAB — COMPREHENSIVE METABOLIC PANEL
ALT: 32 U/L (ref 0–44)
AST: 25 U/L (ref 15–41)
Albumin: 5.2 g/dL — ABNORMAL HIGH (ref 3.5–5.0)
Alkaline Phosphatase: 151 U/L — ABNORMAL HIGH (ref 38–126)
Anion gap: 21 — ABNORMAL HIGH (ref 5–15)
BUN: 90 mg/dL — ABNORMAL HIGH (ref 8–23)
CO2: 20 mmol/L — ABNORMAL LOW (ref 22–32)
Calcium: 10.3 mg/dL (ref 8.9–10.3)
Chloride: 79 mmol/L — ABNORMAL LOW (ref 98–111)
Creatinine, Ser: 4.42 mg/dL — ABNORMAL HIGH (ref 0.44–1.00)
GFR, Estimated: 10 mL/min — ABNORMAL LOW (ref >60)
Glucose, Bld: 137 mg/dL — ABNORMAL HIGH (ref 70–99)
Potassium: 4.2 mmol/L (ref 3.5–5.1)
Sodium: 120 mmol/L — ABNORMAL LOW (ref 135–145)
Total Bilirubin: 0.9 mg/dL (ref 0.3–1.2)
Total Protein: 9.2 g/dL — ABNORMAL HIGH (ref 6.5–8.1)

## 2022-05-25 LAB — CBC
HCT: 39.1 % (ref 36.0–46.0)
Hemoglobin: 14.3 g/dL (ref 12.0–15.0)
MCH: 32.2 pg (ref 26.0–34.0)
MCHC: 36.6 g/dL — ABNORMAL HIGH (ref 30.0–36.0)
MCV: 88.1 fL (ref 80.0–100.0)
Platelets: 598 10*3/uL — ABNORMAL HIGH (ref 150–400)
RBC: 4.44 MIL/uL (ref 3.87–5.11)
RDW: 12.1 % (ref 11.5–15.5)
WBC: 10.9 10*3/uL — ABNORMAL HIGH (ref 4.0–10.5)
nRBC: 0 % (ref 0.0–0.2)

## 2022-05-25 LAB — MAGNESIUM: Magnesium: 2 mg/dL (ref 1.7–2.4)

## 2022-05-25 LAB — TYPE AND SCREEN
ABO/RH(D): B POS
Antibody Screen: NEGATIVE

## 2022-05-25 LAB — HIV ANTIBODY (ROUTINE TESTING W REFLEX): HIV Screen 4th Generation wRfx: NONREACTIVE

## 2022-05-25 MED ORDER — LACTATED RINGERS IV BOLUS
1000.0000 mL | Freq: Once | INTRAVENOUS | Status: AC
  Administered 2022-05-25: 1000 mL via INTRAVENOUS

## 2022-05-25 MED ORDER — FERROUS SULFATE 325 (65 FE) MG PO TABS
325.0000 mg | ORAL_TABLET | ORAL | Status: DC
  Administered 2022-05-26: 325 mg via ORAL
  Filled 2022-05-25: qty 1

## 2022-05-25 MED ORDER — FOLIC ACID 1 MG PO TABS
500.0000 ug | ORAL_TABLET | ORAL | Status: DC
  Administered 2022-05-26: 0.5 mg via ORAL
  Filled 2022-05-25: qty 1

## 2022-05-25 MED ORDER — DIPHENHYDRAMINE HCL 25 MG PO CAPS
25.0000 mg | ORAL_CAPSULE | Freq: Once | ORAL | Status: AC
  Administered 2022-05-25: 25 mg via ORAL
  Filled 2022-05-25: qty 1

## 2022-05-25 MED ORDER — ONDANSETRON HCL 4 MG/2ML IJ SOLN
4.0000 mg | Freq: Four times a day (QID) | INTRAMUSCULAR | Status: DC
  Administered 2022-05-25: 4 mg via INTRAVENOUS
  Filled 2022-05-25 (×3): qty 2

## 2022-05-25 MED ORDER — HEPARIN SODIUM (PORCINE) 5000 UNIT/ML IJ SOLN
5000.0000 [IU] | Freq: Three times a day (TID) | INTRAMUSCULAR | Status: DC
  Administered 2022-05-25 – 2022-05-26 (×5): 5000 [IU] via SUBCUTANEOUS
  Filled 2022-05-25 (×5): qty 1

## 2022-05-25 MED ORDER — ACETAMINOPHEN 650 MG RE SUPP: 650.0000 mg | Freq: Four times a day (QID) | RECTAL | Status: DC | PRN

## 2022-05-25 MED ORDER — ACETAMINOPHEN 325 MG PO TABS: 650.0000 mg | ORAL_TABLET | Freq: Four times a day (QID) | ORAL | Status: DC | PRN

## 2022-05-25 MED ORDER — PROSOURCE PLUS PO LIQD
30.0000 mL | Freq: Three times a day (TID) | ORAL | Status: DC
  Administered 2022-05-25 – 2022-05-26 (×3): 30 mL via ORAL
  Filled 2022-05-25 (×5): qty 30

## 2022-05-25 MED ORDER — SODIUM CHLORIDE 0.9 % IV SOLN: 12.5000 mg | Freq: Four times a day (QID) | INTRAVENOUS | Status: DC | PRN

## 2022-05-25 MED ORDER — CLONAZEPAM 0.5 MG PO TABS: 1.0000 mg | ORAL_TABLET | Freq: Two times a day (BID) | ORAL | Status: DC | PRN

## 2022-05-25 MED ORDER — SODIUM CHLORIDE 0.9 % IV SOLN: INTRAVENOUS | Status: DC

## 2022-05-25 MED ORDER — METOPROLOL TARTRATE 25 MG PO TABS
12.5000 mg | ORAL_TABLET | Freq: Two times a day (BID) | ORAL | Status: DC
  Administered 2022-05-25: 12.5 mg via ORAL
  Filled 2022-05-25 (×3): qty 1

## 2022-05-25 MED ORDER — BOOST / RESOURCE BREEZE PO LIQD CUSTOM
1.0000 | Freq: Three times a day (TID) | ORAL | Status: DC
  Administered 2022-05-25 – 2022-05-27 (×5): 1 via ORAL

## 2022-05-25 NOTE — Assessment & Plan Note (Signed)
Tobacco cessation discussed 

## 2022-05-25 NOTE — ED Triage Notes (Signed)
Pt had surgery on 10/16 and have felt weak ever since and progressively gotten worse; blood in still x1 day, pt states had low bp this morning

## 2022-05-25 NOTE — Assessment & Plan Note (Addendum)
Presented with sodium 120 Secondary to volume depletion and AKI Continue IV fluids>>improving Serial BMP Na 137 on day of dc

## 2022-05-25 NOTE — Hospital Course (Signed)
66 year old female with a history of paroxysmal atrial fibrillation, hypertension, tobacco abuse, GERD and diverticulitis presenting with generalized weakness and dizziness.  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  Subsequently, she underwent laparotomy for planned reversal on 09/20/2021.  At that time, it was not to be prudent to close stoma due to inflammatory changes, scarring, loss of normal tissue planes.  Subsequently, the patient was referred to [facility].  She followed up with Dr. Morton Stall whom subsequently to the patient the surgeon on 05/07/2022.  The patient underwent colostomy closure with creation of a loop ileostomy.  She was discharged home in stable condition with instructions to use loperamide for high ostomy output. Approximately 3 to 4 days after discharge, the patient had some nausea and subsequently developed decreased oral intake.  She stated that she only took loperamide a few times.  However, the patient states that she had been emptying her ileostomy about 7-8 times per day.  However, the patient states that the ileostomy is not always full when she empties it.  Nevertheless, the patient has had decreased oral intake.  She denies any NSAIDs or any other new prescriptions or over-the-counter medications.   She denies any fevers, chills, chest pain, shortness breath, cough, hemoptysis, abdominal pain. Because of her worsening generalized weakness and dizziness, she presented for further evaluation. In the ED, the patient was afebrile and hemodynamically stable albeit with soft blood pressure.  Oxygen saturation was 100% room air.  WBC 10.9, hemoglobin 10.3, platelets 5 90,000.  Sodium 120, potassium 4.2, bicarbonate 20, serum creatinine 4.42.  The patient was started on IV fluids and admitted for further evaluation and treatment of her AKI.

## 2022-05-25 NOTE — Assessment & Plan Note (Signed)
Continue home dose clonazepam PDMP reviewed--she receives monthly clonazepam No. 60

## 2022-05-25 NOTE — Assessment & Plan Note (Addendum)
Baseline creatinine 0.5-0.7 Presented with serum creatinine 4.42 Secondary to volume depletion Continue IV fluids>>improving Encourage oral intake -serum creatinine 0.93 on day of d/c

## 2022-05-25 NOTE — ED Provider Notes (Signed)
[facility] EMERGENCY DEPARTMENT Provider Note   CSN: [id number] Arrival date & time: 05/25/22  1006     History  Chief Complaint  Patient presents with   Weakness    Brandi Rowe is a 66 y.o. female.  Pt with generalized weakness. Indicates ~ 2 weeks ago at [facility] had reversal of colostomy, re-anastomosis and ileostomy procedure - since then reports emptying ileostomy bag 7-8 times per day, generally very loose/watery, not bloody, output.  Denies abd pain or distension. +nausea. No vomiting. Has scant rectal output. Is urinating, but less than normal. Is trying to drink fluids but some nausea and generally poor po intake. Denies fever or chills. No chest pain or discomfort. No sob.   The history is provided by the patient, medical records and the spouse.  Weakness Associated symptoms: nausea   Associated symptoms: no abdominal pain, no chest pain, no dysuria, no fever, no headaches, no shortness of breath and no vomiting        Home Medications Prior to Admission medications   Medication Sig Start Date End Date Taking? Authorizing Provider  acetaminophen (TYLENOL) 500 MG tablet Take 500 mg by mouth 2 (two) times daily as needed for moderate pain or headache.    [provider]  calcium carbonate (TUMS - DOSED IN MG ELEMENTAL CALCIUM) 500 MG chewable tablet Chew 1 tablet by mouth daily as needed for indigestion or heartburn.    [provider]  clonazePAM (KLONOPIN) 1 MG tablet Take 1 tablet (1 mg total) by mouth daily as needed for anxiety. 07/27/21   Eulogio Bear, NP  diphenhydrAMINE (BENADRYL) 25 MG tablet Take 25 mg by mouth at bedtime as needed for allergies.    [provider]  ferrous sulfate 325 (65 FE) MG EC tablet Take 1 tablet (325 mg total) by mouth 2 (two) times daily. Patient taking differently: Take 325 mg by mouth every other day. 05/20/21 05/20/22  Aviva Signs, MD  fluticasone (FLONASE) 50 MCG/ACT nasal spray Place 2 sprays into  both nostrils daily as needed for allergies.    [provider]  folic acid (FOLVITE) 462 MCG tablet Take 400 mcg by mouth every other day.    [provider]  metoprolol tartrate (LOPRESSOR) 50 MG tablet Take 1 tablet (50 mg total) by mouth 2 (two) times daily. 10/02/21 01/01/22  Imogene Burn, PA-C  ondansetron (ZOFRAN) 4 MG tablet Take 1 tablet (4 mg total) by mouth every 8 (eight) hours as needed for nausea or vomiting. 01/04/22   Aviva Signs, MD  Polyvinyl Alcohol-Povidone (REFRESH OP) Place 1 drop into both eyes daily as needed (dry eyes).    [provider]      Allergies    Grass extracts [gramineae pollens]    Review of Systems   Review of Systems  Constitutional:  Negative for chills and fever.  HENT:  Negative for sore throat.   Eyes:  Negative for redness.  Respiratory:  Negative for shortness of breath.   Cardiovascular:  Negative for chest pain.  Gastrointestinal:  Positive for nausea. Negative for abdominal pain, constipation and vomiting.  Genitourinary:  Positive for decreased urine volume. Negative for dysuria and flank pain.  Musculoskeletal:  Negative for back pain and neck pain.  Skin:  Negative for rash.  Neurological:  Positive for weakness. Negative for headaches.  Hematological:  Does not bruise/bleed easily.  Psychiatric/Behavioral:  Negative for confusion.     Physical Exam Updated Vital Signs BP (!) 144/101 (BP  Location: Right Arm)   Pulse 85   Temp 97.8 F (36.6 C) (Oral)   Resp (!) 22   Ht 1.6 m (5\' 3" )   Wt 51.7 kg   SpO2 (!) 10%   BMI 20.19 kg/m  Physical Exam Vitals and nursing note reviewed.  Constitutional:      Appearance: Normal appearance. She is well-developed.  HENT:     Head: Atraumatic.     Nose: Nose normal.     Mouth/Throat:     Mouth: Mucous membranes are moist.  Eyes:     General: No scleral icterus.    Conjunctiva/sclera: Conjunctivae normal.  Neck:     Trachea: No tracheal deviation.   Cardiovascular:     Rate and Rhythm: Normal rate and regular rhythm.     Pulses: Normal pulses.     Heart sounds: Normal heart sounds. No murmur heard.    No friction rub. No gallop.  Pulmonary:     Effort: Pulmonary effort is normal. No respiratory distress.     Breath sounds: Normal breath sounds.  Abdominal:     General: Bowel sounds are normal. There is no distension.     Palpations: Abdomen is soft.     Tenderness: There is no abdominal tenderness. There is no guarding.     Comments: Ostomy patent, liquid output in bag.   Genitourinary:    Comments: No cva tenderness.  Musculoskeletal:        General: No swelling or tenderness.     Cervical back: Normal range of motion and neck supple. No rigidity. No muscular tenderness.     Right lower leg: No edema.     Left lower leg: No edema.  Skin:    General: Skin is warm and dry.     Findings: No rash.  Neurological:     Mental Status: She is alert.     Comments: Alert, speech normal.   Psychiatric:        Mood and Affect: Mood normal.     ED Results / Procedures / Treatments   Labs (all labs ordered are listed, but only abnormal results are displayed) Results for orders placed or performed during the hospital encounter of 05/25/22  CBC  Result Value Ref [place]   WBC 10.9 (H) 4.0 - 10.5 K/uL   RBC 4.44 3.87 - 5.11 MIL/uL   Hemoglobin 14.3 12.0 - 15.0 g/dL   HCT 13/03/23 69.6 - 29.5 %   MCV 88.1 80.0 - 100.0 fL   MCH 32.2 26.0 - 34.0 pg   MCHC 36.6 (H) 30.0 - 36.0 g/dL   RDW 28.4 13.2 - 44.0 %   Platelets 598 (H) 150 - 400 K/uL   nRBC 0.0 0.0 - 0.2 %  Comprehensive metabolic panel  Result Value Ref [place]   Sodium 120 (L) 135 - 145 mmol/L   Potassium 4.2 3.5 - 5.1 mmol/L   Chloride 79 (L) 98 - 111 mmol/L   CO2 20 (L) 22 - 32 mmol/L   Glucose, Bld 137 (H) 70 - 99 mg/dL   BUN 90 (H) 8 - 23 mg/dL   Creatinine, Ser 10.2 (H) 0.44 - 1.00 mg/dL   Calcium 7.25 8.9 - 36.6 mg/dL   Total Protein 9.2 (H) 6.5 - 8.1 g/dL   Albumin  5.2 (H) 3.5 - 5.0 g/dL   AST 25 15 - 41 U/L   ALT 32 0 - 44 U/L   Alkaline Phosphatase 151 (H) 38 - 126 U/L   Total Bilirubin  0.9 0.3 - 1.2 mg/dL   GFR, Estimated 10 (L) >60 mL/min   Anion gap 21 (H) 5 - 15  Magnesium  Result Value Ref [place]   Magnesium 2.0 1.7 - 2.4 mg/dL  Type and screen [facility]  Result Value Ref [place]   ABO/RH(D) B POS    Antibody Screen NEG    Sample Expiration      05/28/2022,2359 Performed at [facility], [address] [place], [place] [address]       EKG None  Radiology No results found.  Procedures Procedures    Medications Ordered in ED Medications  lactated ringers bolus 1,000 mL (has no administration in time range)  lactated ringers bolus 1,000 mL (1,000 mLs Intravenous New Bag/Given 05/25/22 1057)    ED Course/ Medical Decision Making/ A&P                           Medical Decision Making Problems Addressed: AKI (acute kidney injury) [facility]): acute illness or injury with systemic symptoms that poses a threat to life or bodily functions Dehydration: acute illness or injury with systemic symptoms that poses a threat to life or bodily functions Generalized weakness: acute illness or injury High output ileostomy (HCC): acute illness or injury Hyponatremia: acute illness or injury with systemic symptoms that poses a threat to life or bodily functions Nausea: acute illness or injury  Amount and/or Complexity of Data Reviewed Independent Historian:     Details: Family, hx External Data Reviewed: notes. Labs: ordered. Decision-making details documented in ED Course. ECG/medicine tests: ordered and independent interpretation performed. Decision-making details documented in ED Course. Discussion of management or test interpretation with external provider(s): Hospitalists, discussed pt.   Risk Prescription drug management. Decision regarding hospitalization.   Iv ns. Continuous pulse ox and cardiac monitoring. Labs  ordered/sent. Imaging ordered.   Reviewed nursing notes and prior charts for additional history. External reports reviewed. Additional history from: family.   Cardiac monitor: sinus rhythm, rate 88.  Labs reviewed/interpreted by me -  NA low. AKI. Additional ivf boluses.   LR bolus. Fluids. Zofran iv.   Hospitalists consulted for admission. Discussed pt - will admit.   Recheck,  no abd pain or tenderness. No emesis.             Final Clinical Impression(s) / ED Diagnoses Final diagnoses:  None    Rx / DC Orders ED Discharge Orders     None         Cathren Laine, MD 05/25/22 1217

## 2022-05-25 NOTE — H&P (Signed)
History and Physical    Patient: Brandi Rowe KYH:[id number] DOB: 07-01-1956 DOA: 05/25/2022 DOS: the patient was seen and examined on 05/25/2022 PCP: Celene Squibb, MD  Patient coming from: Home  Chief Complaint:  Chief Complaint  Patient presents with   Weakness   HPI: Brandi Rowe is a 66 year old female with a history of paroxysmal atrial fibrillation, hypertension, tobacco abuse, GERD and diverticulitis presenting with generalized weakness and dizziness.  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  Subsequently, she underwent laparotomy for planned reversal on 09/20/2021.  At that time, it was not to be prudent to close stoma due to inflammatory changes, scarring, loss of normal tissue planes.  Subsequently, the patient was referred to [facility].  She followed up with Dr. Morton Stall whom subsequently to the patient the surgeon on 05/07/2022.  The patient underwent colostomy closure with creation of a loop ileostomy.  She was discharged home in stable condition with instructions to use loperamide for high ostomy output. Approximately 3 to 4 days after discharge, the patient had some nausea and subsequently developed decreased oral intake.  She stated that she only took loperamide a few times.  However, the patient states that she had been emptying her ileostomy about 7-8 times per day.  However, the patient states that the ileostomy is not always full when she empties it.  Nevertheless, the patient has had decreased oral intake.  She denies any NSAIDs or any other new prescriptions or over-the-counter medications.   She denies any fevers, chills, chest pain, shortness breath, cough, hemoptysis, abdominal pain. Because of her worsening generalized weakness and dizziness, she presented for further evaluation. In the ED, the patient was afebrile and hemodynamically stable albeit with soft blood  pressure.  Oxygen saturation was 100% room air.  WBC 10.9, hemoglobin 10.3, platelets 5 90,000.  Sodium 120, potassium 4.2, bicarbonate 20, serum creatinine 4.42.  The patient was started on IV fluids and admitted for further evaluation and treatment of her AKI.    Review of Systems: As mentioned in the history of present illness. All other systems reviewed and are negative. Past Medical History:  Diagnosis Date   Acute febrile illness 01/04/2021   Anxiety    Colovaginal fistula    Colovesical fistula    a. 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   Dehydration 01/04/2021   Dysrhythmia    Elevated MCV 01/04/2021   Gout    Hypertension    Nausea 01/04/2021   PAF (paroxysmal atrial fibrillation) ([place])    a. 06/2021 in setting of SBO, HypoK/Mg; b. 06/2021 Echo: EF 50-55%, no rwma, GrI DD, Nl RV size/fxn, RVSP 36.41mmHg. Mod dil RA. Triv MR.   Sepsis ([place]) 01/05/2021   Tobacco abuse    Past Surgical History:  Procedure Laterality Date   BLADDER REPAIR N/A 05/17/2021   Procedure: Closure of Cystotomy;  Surgeon: Cleon Gustin, MD;  Location: [facility];  Service: Urology;  Laterality: N/A;   CARPAL TUNNEL RELEASE Right 2002   COLONOSCOPY WITH PROPOFOL N/A 09/19/2021   Procedure: COLONOSCOPY WITH PROPOFOL;  Surgeon: Aviva Signs, MD;  Location: [facility];  Service: Gastroenterology;  Laterality: N/A;   COLOSTOMY REVERSAL N/A 09/20/2021   Procedure: Small Bowel Resection;  Surgeon: Aviva Signs, MD;  Location: [facility];  Service: General;  Laterality: N/A;   [place] N/A 05/17/2021   Procedure: PARTIAL COLECTOMY WITH COLOSTOMY;  Surgeon: Franky Macho, MD;  Location: [facility];  Service: General;  Laterality: N/A;   PELVIC LYMPH NODE DISSECTION N/A 05/17/2021   Procedure: PELVIC DISSECTION;  Surgeon: Lazaro Arms, MD;  Location: [facility];  Service: Gynecology;  Laterality: N/A;   Social History:  reports that she has been smoking cigarettes. She has a  23.00 pack-year smoking history. She has never used smokeless tobacco. She reports current alcohol use of about 7.0 standard drinks of alcohol per week. She reports that she does not use drugs.  Allergies  Allergen Reactions   Grass Extracts [Gramineae Pollens] Other (See Comments)    Dust, mold, mites Congestion    Family History  Problem Relation Age of Onset   Congestive Heart Failure Mother    Uterine cancer Mother    Pancreatic cancer Father    Atrial fibrillation Brother    Stroke Maternal Grandmother    Heart disease Maternal Grandmother    Glaucoma Maternal Grandfather     Prior to Admission medications   Medication Sig Start Date End Date Taking? Authorizing Provider  acetaminophen (TYLENOL) 500 MG tablet Take 500 mg by mouth 2 (two) times daily as needed for moderate pain or headache.   Yes [provider]  calcium carbonate (TUMS - DOSED IN MG ELEMENTAL CALCIUM) 500 MG chewable tablet Chew 1 tablet by mouth daily as needed for indigestion or heartburn.   Yes [provider]  clonazePAM (KLONOPIN) 1 MG tablet Take 1 tablet (1 mg total) by mouth daily as needed for anxiety. 07/27/21  Yes Valentino Nose, NP  diphenhydrAMINE (BENADRYL) 25 MG tablet Take 25 mg by mouth at bedtime as needed for allergies.   Yes [provider]  ferrous sulfate 325 (65 FE) MG EC tablet Take 1 tablet (325 mg total) by mouth 2 (two) times daily. Patient taking differently: Take 325 mg by mouth every other day. 05/20/21 05/25/22 Yes Franky Macho, MD  fluticasone [facility] [place]) 50 MCG/ACT nasal spray Place 2 sprays into both nostrils daily as needed for allergies.   Yes [provider]  folic acid (FOLVITE) 400 MCG tablet Take 400 mcg by mouth every other day.   Yes [provider]  metoprolol tartrate (LOPRESSOR) 50 MG tablet Take 1 tablet (50 mg total) by mouth 2 (two) times daily. 10/02/21 05/25/22 Yes Dyann Kief, PA-C  ondansetron (ZOFRAN) 4 MG tablet  Take 1 tablet (4 mg total) by mouth every 8 (eight) hours as needed for nausea or vomiting. 01/04/22  Yes Franky Macho, MD  Polyvinyl Alcohol-Povidone (REFRESH OP) Place 1 drop into both eyes daily as needed (dry eyes).   Yes [provider]    Physical Exam: Vitals:   05/25/22 1027 05/25/22 1101 05/25/22 1130 05/25/22 1300  BP: (!) 144/101 105/79 113/80 104/83  Pulse: 85 87 73   Resp: (!) 22 17 17 17   Temp: 97.8 F (36.6 C)     TempSrc: Oral     SpO2: 100% 100% 97% 97%  Weight: 51.7 kg     Height: 5\' 3"  (1.6 m)      GENERAL:  A&O x 3, NAD, well developed, cooperative, follows commands HEENT: [place]/AT, No thrush, No icterus, No oral ulcers Neck:  No neck mass, No meningismus, soft, supple CV: RRR, no S3, no S4, no rub, no JVD Lungs: Diminished breath sounds.  Bibasilar Rales.  No wheezing Abd: soft/NT +BS, nondistended Ext: No edema, no lymphangitis, no cyanosis, no rashes Neuro:  CN II-XII intact, strength 4/5  in RUE, RLE, strength 4/5 LUE, LLE; sensation intact bilateral; no dysmetria; babinski equivocal  Data Reviewed: Data reviewed above in history Assessment and Plan: * AKI (acute kidney injury) (HCC) Baseline creatinine 0.5-0.7 Presented with serum creatinine 4.42 Secondary to volume depletion Continue IV fluids Encourage oral intake  Hyponatremia Presented with sodium 120 Secondary to volume depletion and AKI Continue IV fluids Serial BMP  Paroxysmal atrial fibrillation (HCC) Patient has had postoperative atrial fibrillation Given short duration and non recurrence, she has not been on any anticoagulation Continue metoprolol lower dose given soft blood pressure  Tobacco use disorder Tobacco cessation discussed  Anxiety disorder, unspecified Continue home dose clonazepam PDMP reviewed--she receives monthly clonazepam No. 60      Advance Care Planning: FULL  Consults: none  Family Communication: spouse updated 11/3  Severity of Illness: The  appropriate patient status for this patient is INPATIENT. Inpatient status is judged to be reasonable and necessary in order to provide the required intensity of service to ensure the patient's safety. The patient's presenting symptoms, physical exam findings, and initial radiographic and laboratory data in the context of their chronic comorbidities is felt to place them at high risk for further clinical deterioration. Furthermore, it is not anticipated that the patient will be medically stable for discharge from the hospital within 2 midnights of admission.   * I certify that at the point of admission it is my clinical judgment that the patient will require inpatient hospital care spanning beyond 2 midnights from the point of admission due to high intensity of service, high risk for further deterioration and high frequency of surveillance required.*  Author: Catarina Hartshorn, MD 05/25/2022 1:22 PM  For on call review [email or web address].

## 2022-05-25 NOTE — Progress Notes (Signed)
Initial Nutrition Assessment  DOCUMENTATION CODES:   Underweight  INTERVENTION:  Boost Breeze po TID   ProSource Plus 30 ml TID  NUTRITION DIAGNOSIS:   Inadequate oral intake related to post-op healing (loop ileostomy-high output) as evidenced by per patient/family report (patient dehydrated with AKI on admission, 7-8 liquid stools daily).   GOAL:  Patient will meet greater than or equal to 90% of their needs   MONITOR:  Diet advancement, Labs, Weight trends, Supplement acceptance  REASON FOR ASSESSMENT:   Malnutrition Screening Tool     ASSESSMENT: patient is a 66 yo female with generalized weakness who underwent colostomy reversal, re-anastomosis and loop ileostomy on 05/07/22. Output reported 7-8 loose stools daily. Acute kidney injury, dehydration and nausea.  Patient husband is bedside. Patient receiving clear liquids. Patient reports minimal intake since surgery. She has taste changes and loss of appetite. Provided a Boost Breeze (peach flavor) and ginger ale. Suspect acute malnutrition and risk for micronutrient deficiency.   Medications: folic acid, ferrous sulfate, zofran   Weights have ranged between 47-51 kg this year. Currently 46.8 kg.   IVF-NS@ 75 ml/hr     Latest Ref Rng & Units 05/25/2022   10:53 AM 09/23/2021    5:24 AM 09/22/2021    5:43 AM  BMP  Glucose 70 - 99 mg/dL 137  104  106   BUN 8 - 23 mg/dL 90  6  10   Creatinine 0.44 - 1.00 mg/dL 4.42  0.57  0.58   Sodium 135 - 145 mmol/L 120  138  139   Potassium 3.5 - 5.1 mmol/L 4.2  3.9  3.7   Chloride 98 - 111 mmol/L 79  105  109   CO2 22 - 32 mmol/L 20  25  25    Calcium 8.9 - 10.3 mg/dL 10.3  8.4  8.3       NUTRITION - FOCUSED PHYSICAL EXAM: Deferred to follow up   Diet Order:   Diet Order             Diet clear liquid Room service appropriate? Yes; Fluid consistency: Thin  Diet effective now                   EDUCATION NEEDS:  Education needs have been addressed  Skin:  Skin  Assessment: Reviewed RN Assessment  Last BM:  11/3- pt has an ostomy  Height:   Ht Readings from Last 1 Encounters:  05/25/22 5\' 3"  (1.6 m)    Weight:   Wt Readings from Last 1 Encounters:  05/25/22 46.8 kg    Ideal Body Weight:   52 kg  BMI:  Body mass index is 18.28 kg/m.  Estimated Nutritional Needs:   Kcal:  1600-1800  Protein:  65-70 gr  Fluid:  >1500 ml daily  Colman Cater MS,RD,CSG,LDN Contact: Shea Evans

## 2022-05-25 NOTE — Assessment & Plan Note (Signed)
Patient has had postoperative atrial fibrillation Given short duration and non recurrence, she has not been on any anticoagulation Continue metoprolol lower dose given soft blood pressure Remains in sinus this hospitalization

## 2022-05-26 DIAGNOSIS — N179 Acute kidney failure, unspecified: Secondary | ICD-10-CM | POA: Diagnosis not present

## 2022-05-26 DIAGNOSIS — E860 Dehydration: Secondary | ICD-10-CM

## 2022-05-26 DIAGNOSIS — E871 Hypo-osmolality and hyponatremia: Secondary | ICD-10-CM | POA: Diagnosis not present

## 2022-05-26 DIAGNOSIS — R198 Other specified symptoms and signs involving the digestive system and abdomen: Secondary | ICD-10-CM | POA: Diagnosis not present

## 2022-05-26 LAB — BASIC METABOLIC PANEL
Anion gap: 8 (ref 5–15)
BUN: 60 mg/dL — ABNORMAL HIGH (ref 8–23)
CO2: 25 mmol/L (ref 22–32)
Calcium: 8.8 mg/dL — ABNORMAL LOW (ref 8.9–10.3)
Chloride: 97 mmol/L — ABNORMAL LOW (ref 98–111)
Creatinine, Ser: 1.82 mg/dL — ABNORMAL HIGH (ref 0.44–1.00)
GFR, Estimated: 30 mL/min — ABNORMAL LOW (ref >60)
Glucose, Bld: 91 mg/dL (ref 70–99)
Potassium: 3.5 mmol/L (ref 3.5–5.1)
Sodium: 130 mmol/L — ABNORMAL LOW (ref 135–145)

## 2022-05-26 NOTE — Progress Notes (Signed)
PROGRESS NOTE  Brandi Rowe [id number] DOB: 18-Mar-1956 DOA: 05/25/2022 PCP: Celene Squibb, MD  Brief History:  66 year old female with a history of paroxysmal atrial fibrillation, hypertension, tobacco abuse, GERD and diverticulitis presenting with generalized weakness and dizziness.  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  Subsequently, she underwent laparotomy for planned reversal on 09/20/2021.  At that time, it was not to be prudent to close stoma due to inflammatory changes, scarring, loss of normal tissue planes.  Subsequently, the patient was referred to [facility].  She followed up with Dr. Morton Stall whom subsequently to the patient the surgeon on 05/07/2022.  The patient underwent colostomy closure with creation of a loop ileostomy.  She was discharged home in stable condition with instructions to use loperamide for high ostomy output. Approximately 3 to 4 days after discharge, the patient had some nausea and subsequently developed decreased oral intake.  She stated that she only took loperamide a few times.  However, the patient states that she had been emptying her ileostomy about 7-8 times per day.  However, the patient states that the ileostomy is not always full when she empties it.  Nevertheless, the patient has had decreased oral intake.  She denies any NSAIDs or any other new prescriptions or over-the-counter medications.   She denies any fevers, chills, chest pain, shortness breath, cough, hemoptysis, abdominal pain. Because of her worsening generalized weakness and dizziness, she presented for further evaluation. In the ED, the patient was afebrile and hemodynamically stable albeit with soft blood pressure.  Oxygen saturation was 100% room air.  WBC 10.9, hemoglobin 10.3, platelets 5 90,000.  Sodium 120, potassium 4.2, bicarbonate 20, serum creatinine 4.42.  The patient was  started on IV fluids and admitted for further evaluation and treatment of her AKI.       Assessment and Plan: * AKI (acute kidney injury) ([place]) Baseline creatinine 0.5-0.7 Presented with serum creatinine 4.42 Secondary to volume depletion Continue IV fluids>>improving Encourage oral intake  Hyponatremia Presented with sodium 120 Secondary to volume depletion and AKI Continue IV fluids>>improving Serial BMP  Paroxysmal atrial fibrillation ([place]) Patient has had postoperative atrial fibrillation Given short duration and non recurrence, she has not been on any anticoagulation Continue metoprolol lower dose given soft blood pressure Remains in sinus this hospitalization  Tobacco use disorder Tobacco cessation discussed  Anxiety disorder, unspecified Continue home dose clonazepam PDMP reviewed--she receives monthly clonazepam No. 60      Family Communication:  spouse updated 11/4  Consultants:  none  Code Status:  FULL  DVT Prophylaxis:  [place] Heparin    Procedures: As Listed in Progress Note Above  Antibiotics: None     Subjective: Patient denies fevers, chills, headache, chest pain, dyspnea, nausea, vomiting, diarrhea, abdominal pain, dysuria, hematuria, hematochezia, and melena.  Feeling better.  Wants to eat "real food"   Objective: Vitals:   05/25/22 2045 05/26/22 0555 05/26/22 1004 05/26/22 1335  BP: 113/73 103/72 (!) 90/54 97/71  Pulse: 77 74 72 79  Resp: 20 19  17   Temp: 98.2 F (36.8 C) 97.7 F (36.5 C)  97.8 F (36.6 C)  TempSrc:  Oral  Oral  SpO2: 100% 100%  99%  Weight:      Height:       No intake or output data in the 24 hours ending 05/26/22 1639 Weight change:  Exam:  General:  Pt is alert, follows commands appropriately, not in acute distress HEENT: No icterus, No thrush, No neck mass, [place]/AT Cardiovascular: RRR, S1/S2, no rubs, no gallops Respiratory: CTA bilaterally, no wheezing, no crackles, no rhonchi Abdomen: Soft/+BS, non  tender, non distended, no guarding Extremities: No edema, No lymphangitis, No petechiae, No rashes, no synovitis   Data Reviewed: I have personally reviewed following labs and imaging studies Basic Metabolic Panel: Recent Labs  Lab 05/25/22 1053 05/26/22 0453  NA 120* 130*  K 4.2 3.5  CL 79* 97*  CO2 20* 25  GLUCOSE 137* 91  BUN 90* 60*  CREATININE 4.42* 1.82*  CALCIUM 10.3 8.8*  MG 2.0  --    Liver Function Tests: Recent Labs  Lab 05/25/22 1053  AST 25  ALT 32  ALKPHOS 151*  BILITOT 0.9  PROT 9.2*  ALBUMIN 5.2*   No results for input(s): "LIPASE", "AMYLASE" in the last 168 hours. No results for input(s): "AMMONIA" in the last 168 hours. Coagulation Profile: No results for input(s): "INR", "PROTIME" in the last 168 hours. CBC: Recent Labs  Lab 05/25/22 1053 05/26/22 0453  WBC 10.9* 7.4  HGB 14.3 10.0*  HCT 39.1 27.8*  MCV 88.1 90.8  PLT 598* 334   Cardiac Enzymes: No results for input(s): "CKTOTAL", "CKMB", "CKMBINDEX", "TROPONINI" in the last 168 hours. BNP: Invalid input(s): "POCBNP" CBG: No results for input(s): "GLUCAP" in the last 168 hours. HbA1C: No results for input(s): "HGBA1C" in the last 72 hours. Urine analysis:    Component Value Date/Time   COLORURINE YELLOW 05/25/2022 1425   APPEARANCEUR CLEAR 05/25/2022 1425   APPEARANCEUR Clear 07/26/2021 1003   LABSPEC 1.010 05/25/2022 1425   PHURINE 5.0 05/25/2022 1425   GLUCOSEU NEGATIVE 05/25/2022 1425   HGBUR MODERATE (A) 05/25/2022 1425   BILIRUBINUR NEGATIVE 05/25/2022 1425   BILIRUBINUR Negative 07/26/2021 1003   KETONESUR NEGATIVE 05/25/2022 1425   PROTEINUR NEGATIVE 05/25/2022 1425   NITRITE NEGATIVE 05/25/2022 1425   LEUKOCYTESUR NEGATIVE 05/25/2022 1425   Sepsis Labs: @LABRCNTIP (procalcitonin:4,lacticidven:4) )No results found for this or any previous visit (from the past 240 hour(s)).   Scheduled Meds:  (feeding supplement) PROSource Plus  30 mL Oral TID BM   feeding  supplement  1 Container Oral TID BM   ferrous sulfate  325 mg Oral QODAY   folic acid  993 mcg Oral QODAY   heparin  5,000 Units Subcutaneous Q8H   metoprolol tartrate  12.5 mg Oral BID   Continuous Infusions:  sodium chloride 75 mL/hr at 05/26/22 0543   promethazine (PHENERGAN) injection (IM or IVPB)      Procedures/Studies: No results found.  Orson Eva, DO  [facility]  If 7PM-7AM, please contact night-coverage [email or web address] Password [email or web address] 05/26/2022, 4:39 PM   LOS: 1 day

## 2022-05-26 NOTE — TOC Progression Note (Signed)
  Transition of Care [facility]) Screening Note   Patient Details  Name: CHENAY NESMITH Date of Birth: 1956-05-19   Transition of Care [facility]) CM/SW Contact:    Shade Flood, LCSW Phone Number: 05/26/2022, 11:40 AM    Transition of Care Department [facility]) has reviewed patient and no TOC needs have been identified at this time. We will continue to monitor patient advancement through interdisciplinary progression rounds. If new patient transition needs arise, please place a TOC consult.

## 2022-05-27 DIAGNOSIS — N179 Acute kidney failure, unspecified: Secondary | ICD-10-CM | POA: Diagnosis not present

## 2022-05-27 DIAGNOSIS — E871 Hypo-osmolality and hyponatremia: Secondary | ICD-10-CM | POA: Diagnosis not present

## 2022-05-27 DIAGNOSIS — I480 Paroxysmal atrial fibrillation: Secondary | ICD-10-CM

## 2022-05-27 DIAGNOSIS — R531 Weakness: Secondary | ICD-10-CM | POA: Diagnosis not present

## 2022-05-27 DIAGNOSIS — R198 Other specified symptoms and signs involving the digestive system and abdomen: Secondary | ICD-10-CM | POA: Diagnosis not present

## 2022-05-27 LAB — BASIC METABOLIC PANEL
Anion gap: 6 (ref 5–15)
BUN: 26 mg/dL — ABNORMAL HIGH (ref 8–23)
CO2: 24 mmol/L (ref 22–32)
Calcium: 8.7 mg/dL — ABNORMAL LOW (ref 8.9–10.3)
Chloride: 107 mmol/L (ref 98–111)
Creatinine, Ser: 0.93 mg/dL (ref 0.44–1.00)
GFR, Estimated: >60 mL/min (ref >60)
Glucose, Bld: 103 mg/dL — ABNORMAL HIGH (ref 70–99)
Potassium: 3.2 mmol/L — ABNORMAL LOW (ref 3.5–5.1)
Sodium: 137 mmol/L (ref 135–145)

## 2022-05-27 LAB — MAGNESIUM: Magnesium: 1.5 mg/dL — ABNORMAL LOW (ref 1.7–2.4)

## 2022-05-27 MED ORDER — POTASSIUM CHLORIDE CRYS ER 20 MEQ PO TBCR
40.0000 meq | EXTENDED_RELEASE_TABLET | Freq: Once | ORAL | Status: AC
  Administered 2022-05-27: 40 meq via ORAL
  Filled 2022-05-27: qty 2

## 2022-05-27 MED ORDER — METOPROLOL TARTRATE 25 MG PO TABS: 12.5000 mg | ORAL_TABLET | Freq: Two times a day (BID) | ORAL | 1 refills | Status: DC

## 2022-05-27 MED ORDER — MAGNESIUM SULFATE 2 GM/50ML IV SOLN
2.0000 g | Freq: Once | INTRAVENOUS | Status: AC
  Administered 2022-05-27: 2 g via INTRAVENOUS
  Filled 2022-05-27: qty 50

## 2022-05-27 NOTE — Discharge Summary (Signed)
Physician Discharge Summary   Patient: Brandi Rowe MRN: [id number] DOB: [birth year]  Admit date:     05/25/2022  Discharge date: 05/27/22  Discharge Physician: Onalee Hua Lorette Peterkin   PCP: Benita Stabile, MD   Recommendations at discharge:   Please follow up with primary care provider within 1-2 weeks  Please repeat BMP and CBC in one week     Hospital Course: 66 year old female with a history of paroxysmal atrial fibrillation, hypertension, tobacco abuse, GERD and diverticulitis presenting with generalized weakness and dizziness.  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  Subsequently, she underwent laparotomy for planned reversal on 09/20/2021.  At that time, it was not to be prudent to close stoma due to inflammatory changes, scarring, loss of normal tissue planes.  Subsequently, the patient was referred to [facility].  She followed up with Dr. Byrd Hesselbach whom subsequently to the patient the surgeon on 05/07/2022.  The patient underwent colostomy closure with creation of a loop ileostomy.  She was discharged home in stable condition with instructions to use loperamide for high ostomy output. Approximately 3 to 4 days after discharge, the patient had some nausea and subsequently developed decreased oral intake.  She stated that she only took loperamide a few times.  However, the patient states that she had been emptying her ileostomy about 7-8 times per day.  However, the patient states that the ileostomy is not always full when she empties it.  Nevertheless, the patient has had decreased oral intake.  She denies any NSAIDs or any other new prescriptions or over-the-counter medications.   She denies any fevers, chills, chest pain, shortness breath, cough, hemoptysis, abdominal pain. Because of her worsening generalized weakness and dizziness, she presented for further evaluation. In the ED, the patient was  afebrile and hemodynamically stable albeit with soft blood pressure.  Oxygen saturation was 100% room air.  WBC 10.9, hemoglobin 10.3, platelets 5 90,000.  Sodium 120, potassium 4.2, bicarbonate 20, serum creatinine 4.42.  The patient was started on IV fluids and admitted for further evaluation and treatment of her AKI.    Assessment and Plan: * AKI (acute kidney injury) (HCC) Baseline creatinine 0.5-0.7 Presented with serum creatinine 4.42 Secondary to volume depletion Continue IV fluids>>improving Encourage oral intake -serum creatinine 0.93 on day of d/c  Hypomagnesemia replete  Hyponatremia Presented with sodium 120 Secondary to volume depletion and AKI Continue IV fluids>>improving Serial BMP Na 137 on day of dc  Hypokalemia replete  Paroxysmal atrial fibrillation (HCC) Patient has had postoperative atrial fibrillation Given short duration and non recurrence, she has not been on any anticoagulation Continue metoprolol lower dose given soft blood pressure Remains in sinus this hospitalization  Tobacco use disorder Tobacco cessation discussed  Anxiety disorder, unspecified Continue home dose clonazepam PDMP reviewed--she receives monthly clonazepam No. 60         Consultants: none Procedures performed: none  Disposition: Home Diet recommendation:  Soft diet DISCHARGE MEDICATION: Allergies as of 05/27/2022       Reactions   Grass Extracts [gramineae Pollens] Other (See Comments)   Dust, mold, mites Congestion        Medication List     TAKE these medications    acetaminophen 500 MG tablet Commonly known as: TYLENOL Take 500 mg by mouth 2 (two) times daily as needed for moderate pain or headache.   calcium carbonate 500 MG chewable tablet Commonly known as: TUMS - dosed in  mg elemental calcium Chew 1 tablet by mouth daily as needed for indigestion or heartburn.   clonazePAM 1 MG tablet Commonly known as: KLONOPIN Take 1 tablet (1 mg total)  by mouth daily as needed for anxiety.   diphenhydrAMINE 25 MG tablet Commonly known as: BENADRYL Take 25 mg by mouth at bedtime as needed for allergies.   ferrous sulfate 325 (65 FE) MG EC tablet Take 1 tablet (325 mg total) by mouth 2 (two) times daily. What changed: when to take this   fluticasone 50 MCG/ACT nasal spray Commonly known as: FLONASE Place 2 sprays into both nostrils daily as needed for allergies.   folic acid 485 MCG tablet Commonly known as: FOLVITE Take 400 mcg by mouth every other day.   metoprolol tartrate 25 MG tablet Commonly known as: LOPRESSOR Take 0.5 tablets (12.5 mg total) by mouth 2 (two) times daily. What changed:  medication strength how much to take   ondansetron 4 MG tablet Commonly known as: Zofran Take 1 tablet (4 mg total) by mouth every 8 (eight) hours as needed for nausea or vomiting.   REFRESH OP Place 1 drop into both eyes daily as needed (dry eyes).        Discharge Exam: Filed Weights   05/25/22 1027 05/25/22 1327  Weight: 51.7 kg 46.8 kg   HEENT:  [place]/AT, No thrush, no icterus CV:  RRR, no rub, no S3, no S4 Lung:  CTA, no wheeze, no rhonchi Abd:  soft/+BS, NT Ext:  No edema, no lymphangitis, no synovitis, no rash   Condition at discharge: stable  The results of significant diagnostics from this hospitalization (including imaging, microbiology, ancillary and laboratory) are listed below for reference.   Imaging Studies: No results found.  Microbiology: Results for orders placed or performed during the hospital encounter of 09/18/21  SARS CORONAVIRUS 2 (Nickayla Mcinnis 6-24 HRS) Nasopharyngeal Nasopharyngeal Swab     Status: None   Collection Time: 09/18/21 10:52 AM   Specimen: Nasopharyngeal Swab  Result Value Ref Range Status   SARS Coronavirus 2 NEGATIVE NEGATIVE Final    Comment: (NOTE) SARS-CoV-2 target nucleic acids are NOT DETECTED.  The SARS-CoV-2 RNA is generally detectable in upper and lower respiratory specimens  during the acute phase of infection. Negative results do not preclude SARS-CoV-2 infection, do not rule out co-infections with other pathogens, and should not be used as the sole basis for treatment or other patient management decisions. Negative results must be combined with clinical observations, patient history, and epidemiological information. The expected result is Negative.  Fact Sheet for Patients: SugarRoll.be  Fact Sheet for Healthcare Providers: https://www.woods-mathews.com/  This test is not yet approved or cleared by the [place] FDA and  has been authorized for detection and/or diagnosis of SARS-CoV-2 by FDA under an Emergency Use Authorization (EUA). This EUA will remain  in effect (meaning this test can be used) for the duration of the COVID-19 declaration under Se ction 564(b)(1) of the Act, 21 U.S.C. section 360bbb-3(b)(1), unless the authorization is terminated or revoked sooner.  Performed at [facility], [place] [address], [place], [place] [address]     Labs: CBC: Recent Labs  Lab 05/25/22 1053 05/26/22 0453  WBC 10.9* 7.4  HGB 14.3 10.0*  HCT 39.1 27.8*  MCV 88.1 90.8  PLT 598* 350   Basic Metabolic Panel: Recent Labs  Lab 05/25/22 1053 05/26/22 0453 05/27/22 0453  NA 120* 130* 137  K 4.2 3.5 3.2*  CL 79* 97* 107  CO2 20* 25 24  GLUCOSE 137* 91 103*  BUN 90* 60* 26*  CREATININE 4.42* 1.82* 0.93  CALCIUM 10.3 8.8* 8.7*  MG 2.0  --  1.5*   Liver Function Tests: Recent Labs  Lab 05/25/22 1053  AST 25  ALT 32  ALKPHOS 151*  BILITOT 0.9  PROT 9.2*  ALBUMIN 5.2*   CBG: No results for input(s): "GLUCAP" in the last 168 hours.  Discharge time spent: greater than 30 minutes.  Signed: Catarina Hartshorn, MD [facility] 05/27/2022

## 2022-05-27 NOTE — Progress Notes (Signed)
Nsg Discharge Note  Admit Date:  05/25/2022 Discharge date: 05/27/2022   Brandi Rowe to be D/C'd Home per MD order.  AVS completed.   Patient/caregiver able to verbalize understanding.  Discharge Medication: Allergies as of 05/27/2022       Reactions   Grass Extracts [gramineae Pollens] Other (See Comments)   Dust, mold, mites Congestion        Medication List     TAKE these medications    acetaminophen 500 MG tablet Commonly known as: TYLENOL Take 500 mg by mouth 2 (two) times daily as needed for moderate pain or headache.   calcium carbonate 500 MG chewable tablet Commonly known as: TUMS - dosed in mg elemental calcium Chew 1 tablet by mouth daily as needed for indigestion or heartburn.   clonazePAM 1 MG tablet Commonly known as: KLONOPIN Take 1 tablet (1 mg total) by mouth daily as needed for anxiety.   diphenhydrAMINE 25 MG tablet Commonly known as: BENADRYL Take 25 mg by mouth at bedtime as needed for allergies.   ferrous sulfate 325 (65 FE) MG EC tablet Take 1 tablet (325 mg total) by mouth 2 (two) times daily. What changed: when to take this   fluticasone 50 MCG/ACT nasal spray Commonly known as: FLONASE Place 2 sprays into both nostrils daily as needed for allergies.   folic acid 329 MCG tablet Commonly known as: FOLVITE Take 400 mcg by mouth every other day.   metoprolol tartrate 25 MG tablet Commonly known as: LOPRESSOR Take 0.5 tablets (12.5 mg total) by mouth 2 (two) times daily. What changed:  medication strength how much to take   ondansetron 4 MG tablet Commonly known as: Zofran Take 1 tablet (4 mg total) by mouth every 8 (eight) hours as needed for nausea or vomiting.   REFRESH OP Place 1 drop into both eyes daily as needed (dry eyes).        Discharge Assessment: Vitals:   05/26/22 2035 05/27/22 0346  BP: (!) 96/54 108/62  Pulse: 76 71  Resp: 14 14  Temp: 98.1 F (36.7 C) 98.3 F (36.8 C)  SpO2: 100% 99%   Skin clean,  dry and intact without evidence of skin break down, no evidence of skin tears noted. IV catheter discontinued intact. Site without signs and symptoms of complications - no redness or edema noted at insertion site, patient denies c/o pain - only slight tenderness at site.  Dressing with slight pressure applied.  D/c Instructions-Education: Discharge instructions given to patient/family with verbalized understanding. D/c education completed with patient/family including follow up instructions, medication list, d/c activities limitations if indicated, with other d/c instructions as indicated by MD - patient able to verbalize understanding, all questions fully answered. Patient instructed to return to ED, call 911, or call MD for any changes in condition.  Patient escorted via [place], and D/C home via private auto.  Kathie Rhodes, RN 05/27/2022 9:48 AM

## 2022-05-27 NOTE — Assessment & Plan Note (Signed)
-  replete °

## 2022-05-28 LAB — CBC
HCT: 27.8 % — ABNORMAL LOW (ref 36.0–46.0)
Hemoglobin: 10 g/dL — ABNORMAL LOW (ref 12.0–15.0)
MCH: 32.7 pg (ref 26.0–34.0)
MCHC: 36 g/dL (ref 30.0–36.0)
MCV: 90.8 fL (ref 80.0–100.0)
Platelets: 334 10*3/uL (ref 150–400)
RBC: 3.06 MIL/uL — ABNORMAL LOW (ref 3.87–5.11)
RDW: 12.2 % (ref 11.5–15.5)
WBC: 7.4 10*3/uL (ref 4.0–10.5)
nRBC: 0 % (ref 0.0–0.2)

## 2022-05-30 DIAGNOSIS — Z933 Colostomy status: Secondary | ICD-10-CM | POA: Diagnosis not present

## 2022-05-31 DIAGNOSIS — K5792 Diverticulitis of intestine, part unspecified, without perforation or abscess without bleeding: Secondary | ICD-10-CM | POA: Diagnosis not present

## 2022-05-31 DIAGNOSIS — Z932 Ileostomy status: Secondary | ICD-10-CM | POA: Diagnosis not present

## 2022-06-18 DIAGNOSIS — K6389 Other specified diseases of intestine: Secondary | ICD-10-CM | POA: Diagnosis not present

## 2022-06-18 DIAGNOSIS — K5792 Diverticulitis of intestine, part unspecified, without perforation or abscess without bleeding: Secondary | ICD-10-CM | POA: Diagnosis not present

## 2022-08-01 DIAGNOSIS — K219 Gastro-esophageal reflux disease without esophagitis: Secondary | ICD-10-CM | POA: Diagnosis not present

## 2022-08-01 DIAGNOSIS — F17210 Nicotine dependence, cigarettes, uncomplicated: Secondary | ICD-10-CM | POA: Diagnosis not present

## 2022-08-01 DIAGNOSIS — Z432 Encounter for attention to ileostomy: Secondary | ICD-10-CM | POA: Diagnosis not present

## 2022-08-01 DIAGNOSIS — Z9049 Acquired absence of other specified parts of digestive tract: Secondary | ICD-10-CM | POA: Diagnosis not present

## 2022-08-01 DIAGNOSIS — I10 Essential (primary) hypertension: Secondary | ICD-10-CM | POA: Diagnosis not present

## 2022-08-01 DIAGNOSIS — I480 Paroxysmal atrial fibrillation: Secondary | ICD-10-CM | POA: Diagnosis not present

## 2022-09-21 ENCOUNTER — Ambulatory Visit: Payer: Medicare Other | Attending: Internal Medicine | Admitting: Internal Medicine

## 2022-09-21 ENCOUNTER — Encounter: Payer: Self-pay | Admitting: Internal Medicine

## 2022-09-21 VITALS — BP 166/98 | HR 66 | Ht 63.5 in | Wt <= 1120 oz

## 2022-09-21 DIAGNOSIS — I480 Paroxysmal atrial fibrillation: Secondary | ICD-10-CM | POA: Diagnosis not present

## 2022-09-21 MED ORDER — APIXABAN 5 MG PO TABS: 5.0000 mg | ORAL_TABLET | Freq: Two times a day (BID) | ORAL | 11 refills | Status: DC

## 2022-09-21 MED ORDER — DILTIAZEM HCL ER COATED BEADS 120 MG PO CP24: 120.0000 mg | ORAL_CAPSULE | Freq: Every day | ORAL | 3 refills | Status: DC

## 2022-09-21 MED ORDER — METOPROLOL TARTRATE 25 MG PO TABS: 12.5000 mg | ORAL_TABLET | Freq: Two times a day (BID) | ORAL | 3 refills | Status: DC

## 2022-09-21 NOTE — Patient Instructions (Addendum)
Medication Instructions:  Your physician has recommended you make the following change in your medication:  - Start Eliquis 5 mg tablets twice daily.   *If you need a refill on your cardiac medications before your next appointment, please call your pharmacy*   Lab Work: None If you have labs (blood work) drawn today and your tests are completely normal, you will receive your results only by: [place] (if you have MyChart) OR A paper copy in the mail If you have any lab test that is abnormal or we need to change your treatment, we will call you to review the results.   Testing/Procedures: None   Follow-Up: At [facility], you and your health needs are our priority.  As part of our continuing mission to provide you with exceptional heart care, we have created designated Provider Care Teams.  These Care Teams include your primary Cardiologist (physician) and Advanced Practice Providers (APPs -  Physician Assistants and Nurse Practitioners) who all work together to provide you with the care you need, when you need it.  We recommend signing up for the patient portal called "MyChart".  Sign up information is provided on this After Visit Summary.  MyChart is used to connect with patients for Virtual Visits (Telemedicine).  Patients are able to view lab/test results, encounter notes, upcoming appointments, etc.  Non-urgent messages can be sent to your provider as well.   To learn more about what you can do with MyChart, go to [email or web address].    Your next appointment:   6 month(s)  Provider:   Claudina Lick, MD    Other Instructions

## 2022-09-21 NOTE — Progress Notes (Signed)
Cardiology Office Note  Date: 09/21/2022   ID: Brandi Rowe, DOB 06/18/1956, MRN [id number]  PCP:  Celene Squibb, MD  Cardiologist:  Chalmers Guest, MD Electrophysiologist:  None   Reason for Office Visit: Follow-up of paroxysmal A-fib   History of Present Illness: Brandi Rowe is a 67 y.o. 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  Patient initially was diagnosed with new onset atrial fibrillation in 2022 in the setting of SBO with electrolyte abnormalities. Anticoagulation was not recommended at that time due to recent surgery and the fact that she has an ostomy which needed to be reversed. She later underwent 2-week event monitor in 08/2021 which showed numerous short runs of SVT and a 4 beat run of NSVT, but no A-fib. Metoprolol was increased to 50 mg twice daily.  However, she was admitted to [facility] in 05/2022 with dehydration and AKI when the metoprolol dose was decreased from 50 to 12.5 mg twice daily.  She presents today for follow-up visit. Denies any palpitations, dizziness, syncope, leg swelling, chest pain or any DOE.  She says she walks in her sleep frequently. Never tested for OSA in the past.  Has a strong family history of A-fib, her brother was diagnosed with A-fib, her mother had pacemaker and multiple strokes but patient does not know if she ever had A-fib.  Patient says she was not diagnosed with HTN.  Past Medical History:  Diagnosis Date   Acute febrile illness 01/04/2021   Anxiety    Colovaginal fistula    Colovesical fistula    a. 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   Dehydration 01/04/2021   Dysrhythmia    Elevated MCV 01/04/2021   Gout    Hypertension    Nausea 01/04/2021   PAF (paroxysmal atrial fibrillation) ([place])    a. 06/2021 in setting of SBO, HypoK/Mg; b. 06/2021 Echo: EF  50-55%, no rwma, GrI DD, Nl RV size/fxn, RVSP 36.37mHg. Mod dil RA. Triv MR.   Sepsis ([place] 01/05/2021   Tobacco abuse     Past Surgical History:  Procedure Laterality Date   BLADDER REPAIR N/A 05/17/2021   Procedure: Closure of Cystotomy;  Surgeon: MCleon Gustin MD;  Location: [facility] ORS;  Service: Urology;  Laterality: N/A;   CARPAL TUNNEL RELEASE Right 2002   COLONOSCOPY WITH PROPOFOL N/A 09/19/2021   Procedure: COLONOSCOPY WITH PROPOFOL;  Surgeon: JAviva Signs MD;  Location: [facility] ENDO SUITE;  Service: Gastroenterology;  Laterality: N/A;   COLOSTOMY REVERSAL N/A 09/20/2021   Procedure: Small Bowel Resection;  Surgeon: JAviva Signs MD;  Location: [facility] ORS;  Service: General;  Laterality: N/A;   MOsageN/A 05/17/2021   Procedure: PARTIAL COLECTOMY WITH COLOSTOMY;  Surgeon: JAviva Signs MD;  Location: [facility] ORS;  Service: General;  Laterality: N/A;   PELVIC LYMPH NODE DISSECTION N/A 05/17/2021   Procedure: PELVIC DISSECTION;  Surgeon: EFlorian Buff MD;  Location: [facility] ORS;  Service: Gynecology;  Laterality: N/A;    Current Outpatient Medications  Medication Sig Dispense Refill   acetaminophen (TYLENOL) 500 MG tablet Take 500 mg by mouth 2 (two) times daily as needed for moderate pain or headache.     calcium carbonate (TUMS - DOSED IN MG ELEMENTAL CALCIUM) 500 MG chewable tablet Chew 1 tablet by mouth daily as needed for indigestion or heartburn.  clonazePAM (KLONOPIN) 1 MG tablet Take 1 tablet (1 mg total) by mouth daily as needed for anxiety. 60 tablet 0   cyanocobalamin (VITAMIN B12) 1000 MCG tablet Take by mouth every other day.     diphenhydrAMINE (BENADRYL) 25 MG tablet Take 25 mg by mouth at bedtime as needed for allergies.     ferrous sulfate 325 (65 FE) MG EC tablet Take 1 tablet (325 mg total) by mouth 2 (two) times daily. (Patient taking differently: Take 325 mg by mouth every other day.) 60 tablet 3   fluticasone (FLONASE) 50 MCG/ACT  nasal spray Place 2 sprays into both nostrils daily as needed for allergies.     folic acid (FOLVITE) A999333 MCG tablet Take 400 mcg by mouth every other day.     loperamide (IMODIUM) 2 MG capsule as needed for diarrhea or loose stools.     metoprolol tartrate (LOPRESSOR) 25 MG tablet Take 0.5 tablets (12.5 mg total) by mouth 2 (two) times daily. 60 tablet 1   ondansetron (ZOFRAN) 4 MG tablet Take 1 tablet (4 mg total) by mouth every 8 (eight) hours as needed for nausea or vomiting. 20 tablet 0   Polyvinyl Alcohol-Povidone (REFRESH OP) Place 1 drop into both eyes daily as needed (dry eyes).     No current facility-administered medications for this visit.   Allergies:  Grass extracts [gramineae pollens]   Social History: The patient  reports that she has been smoking cigarettes. She has a 23.00 pack-year smoking history. She has never used smokeless tobacco. She reports current alcohol use of about 7.0 standard drinks of alcohol per week. She reports that she does not use drugs.   Family History: The patient's family history includes Atrial fibrillation in her brother; Congestive Heart Failure in her mother; Glaucoma in her maternal grandfather; Heart disease in her maternal grandmother; Pancreatic cancer in her father; Stroke in her maternal grandmother; Uterine cancer in her mother.   ROS:  Please see the history of present illness. Otherwise, complete review of systems is positive for none.  All other systems are reviewed and negative.   Physical Exam: VS:  BP (!) 166/98   Pulse 66   Ht 5' 3.5" (1.613 m)   Wt 18 lb (8.165 kg)   SpO2 95%   BMI 3.14 kg/m , BMI Body mass index is 3.14 kg/m.  Wt Readings from Last 3 Encounters:  09/21/22 18 lb (8.165 kg)  05/25/22 103 lb 3.2 oz (46.8 kg)  01/01/22 104 lb (47.2 kg)    General: Patient appears comfortable at rest. HEENT: Conjunctiva and lids normal, oropharynx clear with moist mucosa. Neck: Supple, no elevated JVP or carotid bruits, no  thyromegaly. Lungs: Clear to auscultation, nonlabored breathing at rest. Cardiac: Regular rate and rhythm, no S3 or significant systolic murmur, no pericardial rub. Abdomen: Soft, nontender, no hepatomegaly, bowel sounds present, no guarding or rebound. Extremities: No pitting edema, distal pulses 2+. Skin: Warm and dry. Musculoskeletal: No kyphosis. Neuropsychiatric: Alert and oriented x3, affect grossly appropriate.  ECG:  An ECG dated 09/21/2022 was personally reviewed today and demonstrated:  Normal sinus rhythm with PACs  Recent Labwork: 05/25/2022: ALT 32; AST 25 05/26/2022: Hemoglobin 10.0; Platelets 334 05/27/2022: BUN 26; Creatinine, Ser 0.93; Magnesium 1.5; Potassium 3.2; Sodium 137     Component Value Date/Time   CHOL 201 (H) 07/18/2021 0215   TRIG 206 (H) 07/18/2021 0215   HDL 78 07/18/2021 0215   CHOLHDL 2.6 07/18/2021 0215   VLDL 41 (H) 07/18/2021 0215  [place] 82 07/18/2021 0215   LDLCALC 101 (H) 09/24/2018 0924    Other Studies Reviewed Today: Echocardiogram in 2022 LVEF 50 to 55% No valve abnormalities  Assessment and Plan: Patient is a 67 year old F known to have paroxysmal A-fib newly diagnosed in 2022 presented to cardiology clinic for follow-up visit.  # Paroxysmal A-fib, CHA2DS2-VASc 2 score is 2 -Continue metoprolol tartrate 12.5 mg twice daily -CHA2DS2-VASc score is 2 (age more than 69, female age, no diagnosis of HTN per patient).  Due to strong family history of atrial fibrillation in her brother and possibly in her mother (her mother had pacemaker and multiple strokes), start Eliquis 5 mg twice daily. I discussed risks and benefits of being on systemic anticoagulation.  # [place] HTN, r/o masked HTN -Patient says she was not diagnosed with HTN. Office blood pressures have been high for the last few months. Does not check her pressures at home.  Instructed her to check pressures at home daily.   I have spent a total of 33 minutes with patient reviewing  chart, EKGs, labs and examining patient as well as establishing an assessment and plan that was discussed with the patient.  > 50% of time was spent in direct patient care.     Medication Adjustments/Labs and Tests Ordered: Current medicines are reviewed at length with the patient today.  Concerns regarding medicines are outlined above.   Tests Ordered: Orders Placed This Encounter  Procedures   EKG 12-Lead    Medication Changes: No orders of the defined types were placed in this encounter.   Disposition:  Follow up  6 months  Signed, Brandi Dabbs Fidel Levy, MD, 09/21/2022 1:54 PM    [facility] [address] [place], [place] [address]

## 2022-12-21 IMAGING — DX DG CHEST 1V PORT
1 series · 1 of 1 positions shown · non-contrast
Comparison: None.

CLINICAL DATA: Fever

EXAM:
PORTABLE CHEST 1 VIEW

[chest ap]
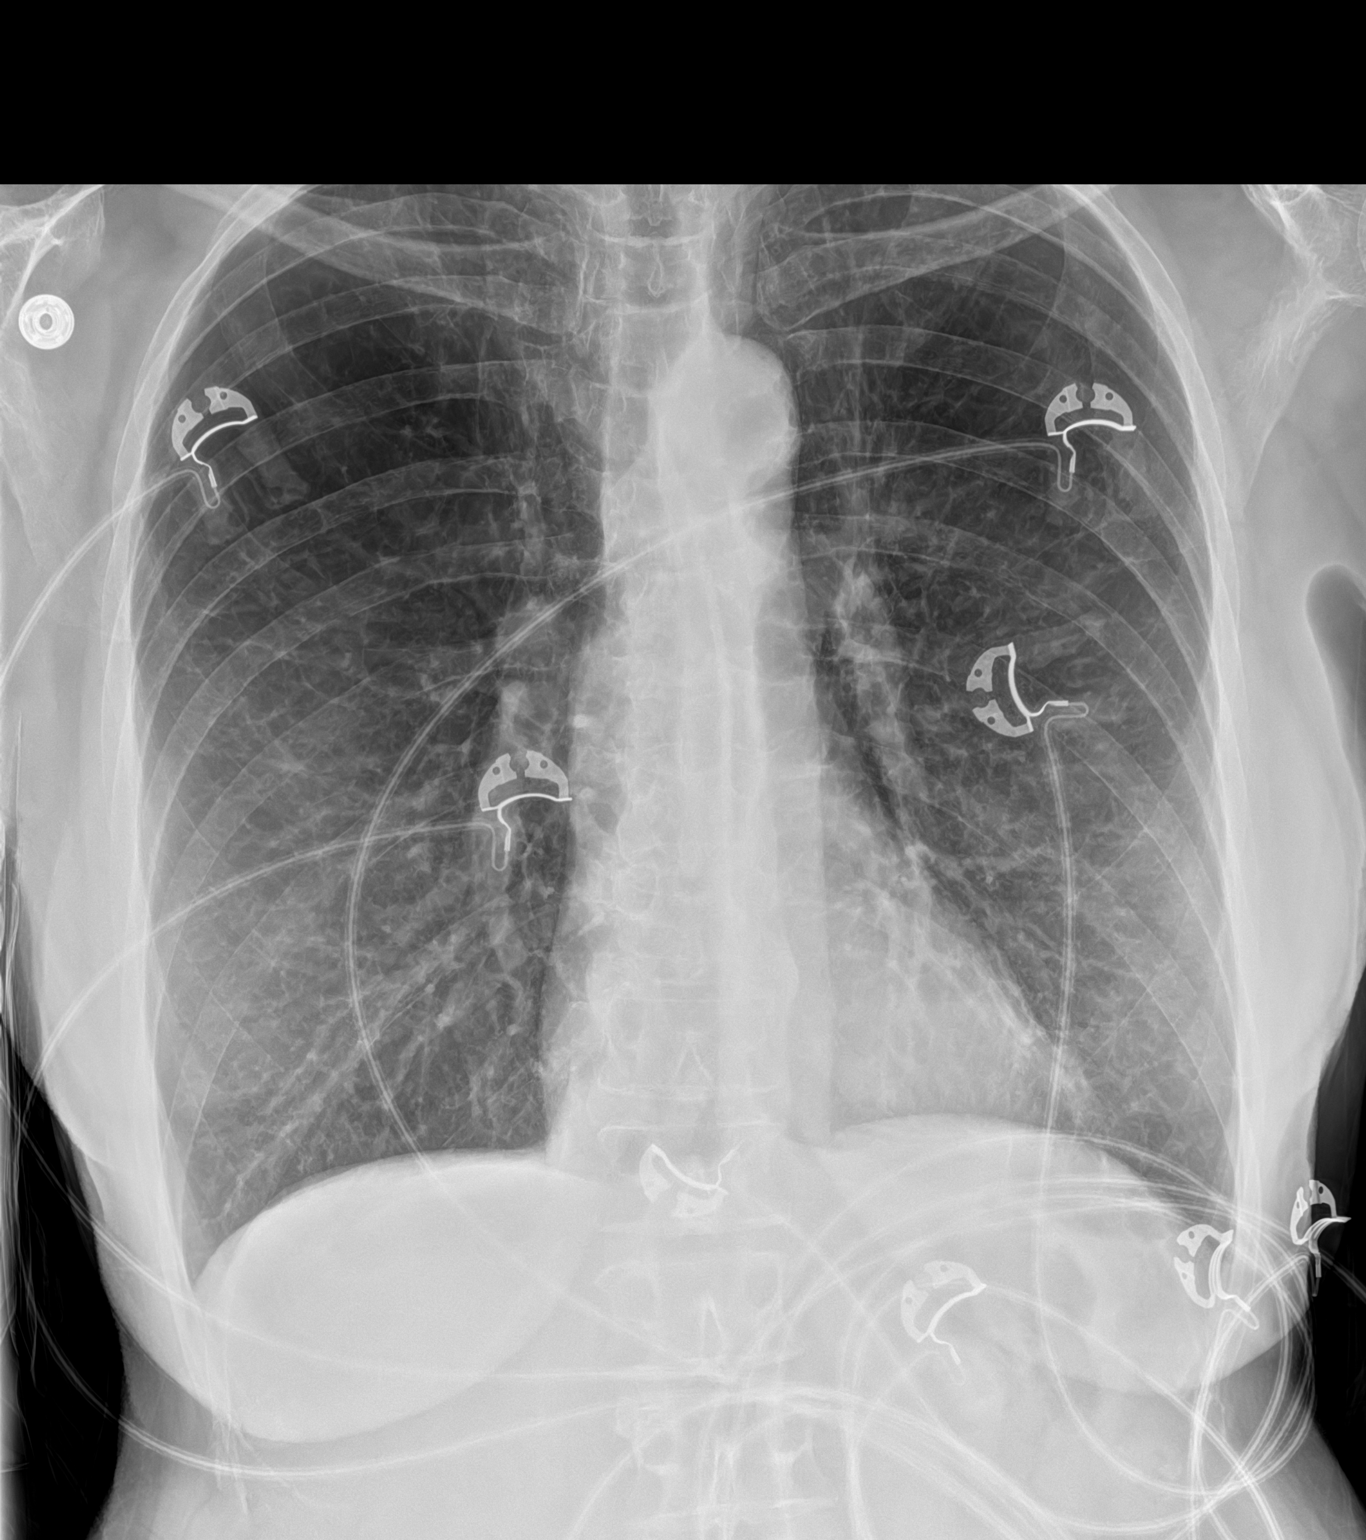

[1 of 1 positions shown; findings below may reference images not displayed]

FINDINGS: Lungs are hyperinflated and hyperlucent compatible with COPD. No
infiltrate or effusion. Mild apical scarring bilaterally. Heart size
and vascularity normal.
IMPRESSION: COPD without acute abnormality.

## 2022-12-21 IMAGING — CT CT ABD-PELV W/ CM
2 of 5 series · 15 of 46 positions shown, 17 images · IV contrast (Omnipaque or Isovue)
Comparison: Noncontrast CT abdomen and pelvis 01/04/2021

CLINICAL DATA: Abdominal pain, fever, chills, nausea, and diarrhea.

EXAM:
CT ABDOMEN AND PELVIS WITH CONTRAST
TECHNIQUE: Multidetector CT imaging of the abdomen and pelvis was performed
using the standard protocol following bolus administration of
intravenous contrast.
CONTRAST:  100mL OMNIPAQUE IOHEXOL 300 MG/ML  SOLN

[Series 2: axial st · axial · 0.77mm/px · z∈[+773,+1148]mm · 12 of 85 slices shown, 14 images]
[im 5/85  soft-tissue]
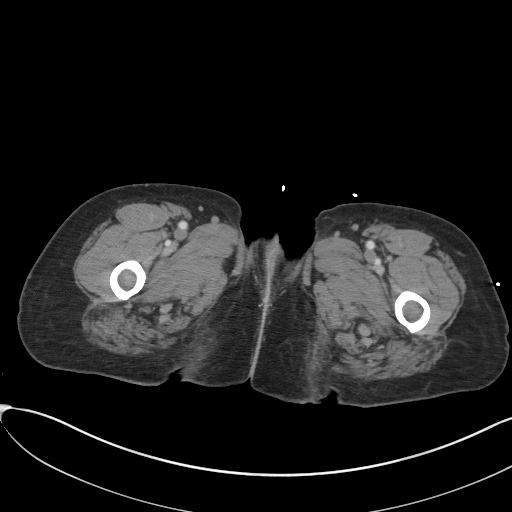
[im 5/85  bone]
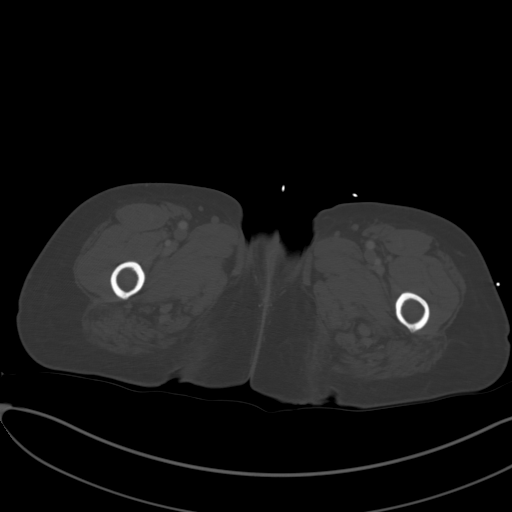
[im 14/85  soft-tissue]
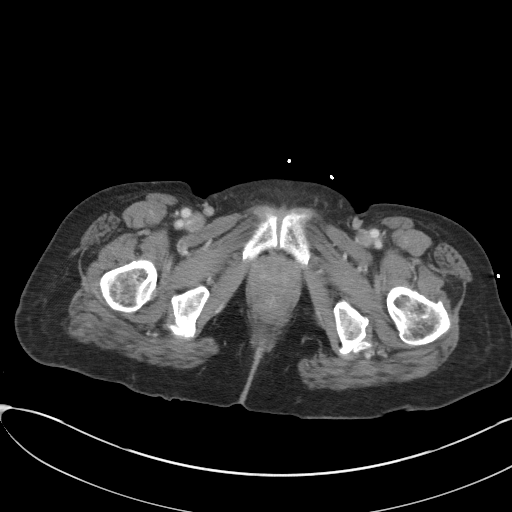
[im 18/85  soft-tissue]
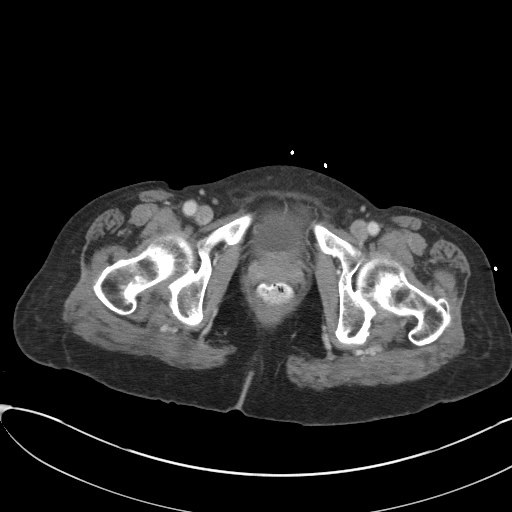
[im 27/85  soft-tissue]
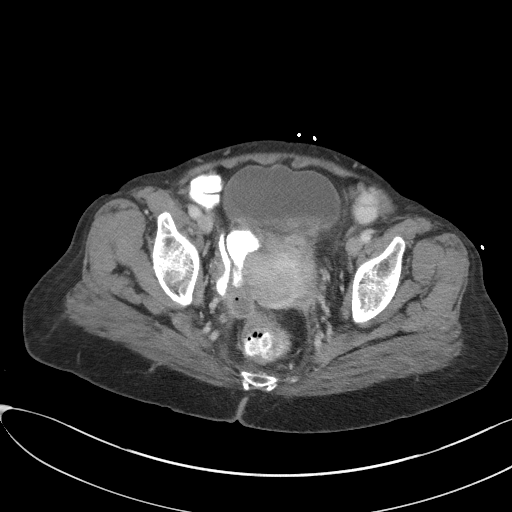
[im 31/85  soft-tissue]
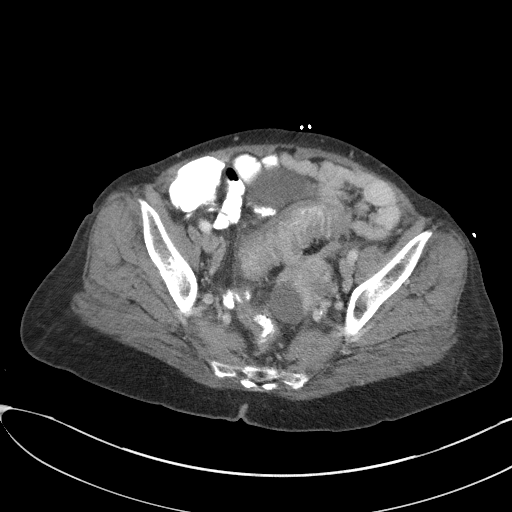
[im 40/85  soft-tissue]
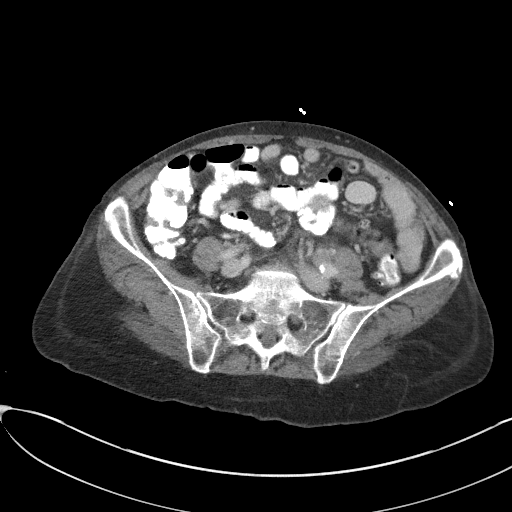
[im 45/85  soft-tissue]
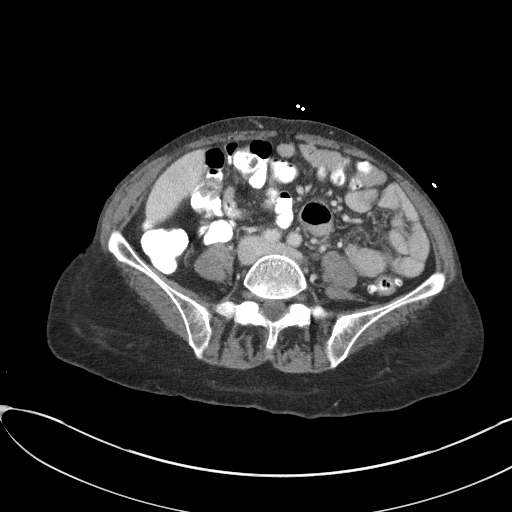
[im 54/85  soft-tissue]
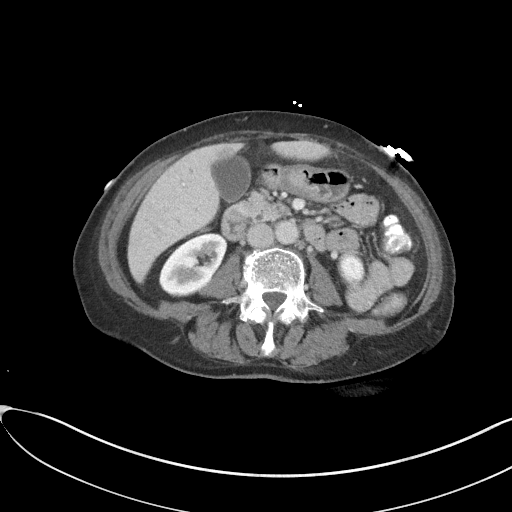
[im 58/85  soft-tissue]
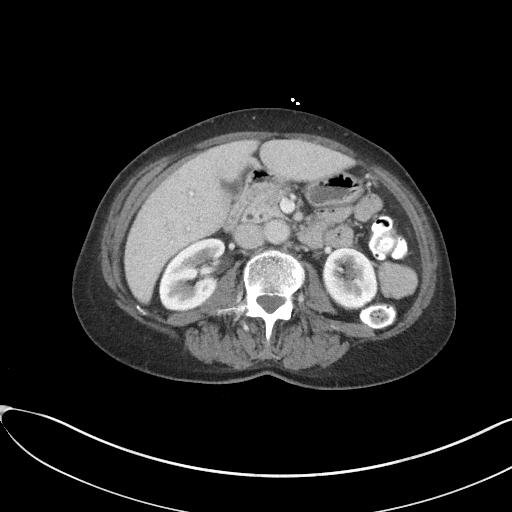
[im 58/85  bone]
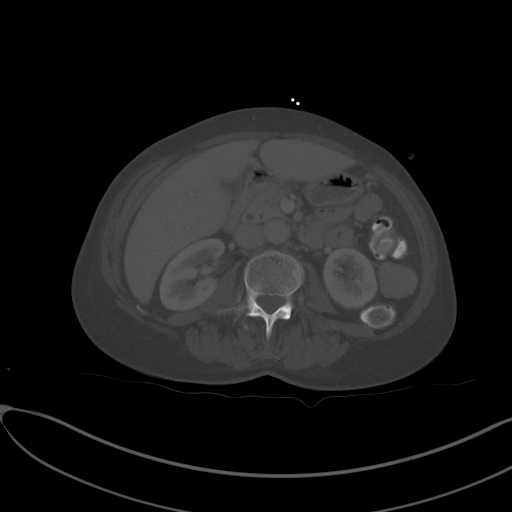
[im 67/85  soft-tissue]
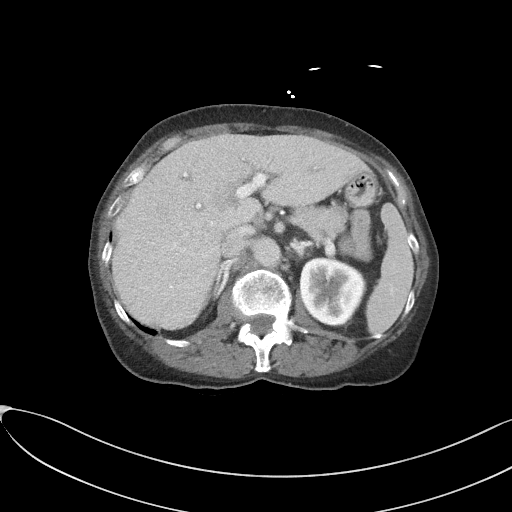
[im 71/85  soft-tissue]
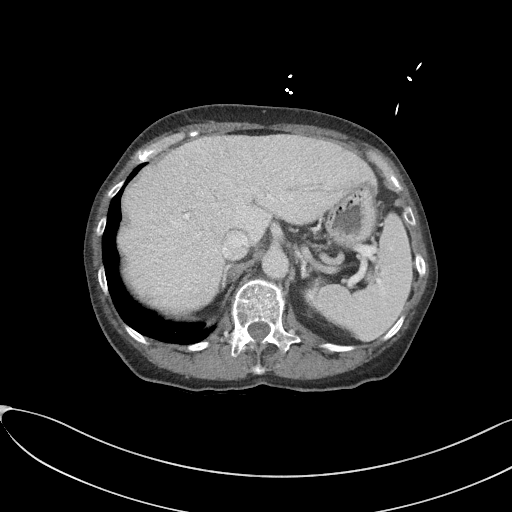
[im 80/85  soft-tissue]
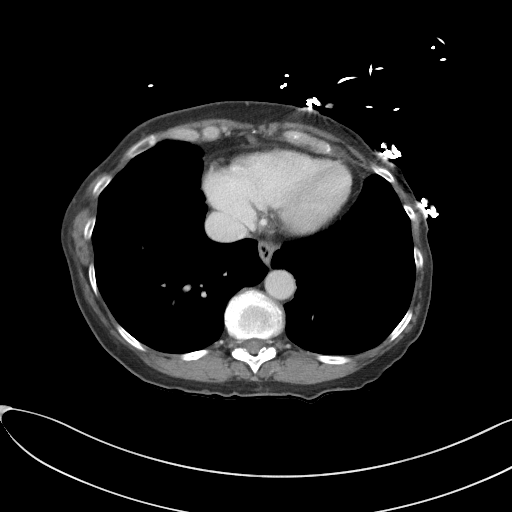

[Series 6: coronal st · coronal · 0.73mm/px · 3 of 91 slices shown]
[im 31/91  soft-tissue]
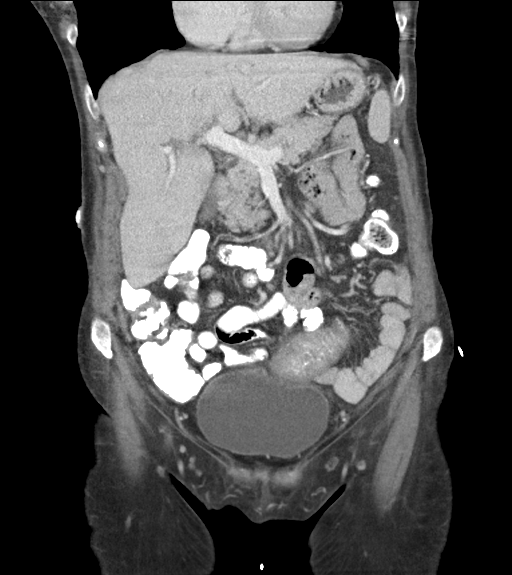
[im 41/91  soft-tissue]
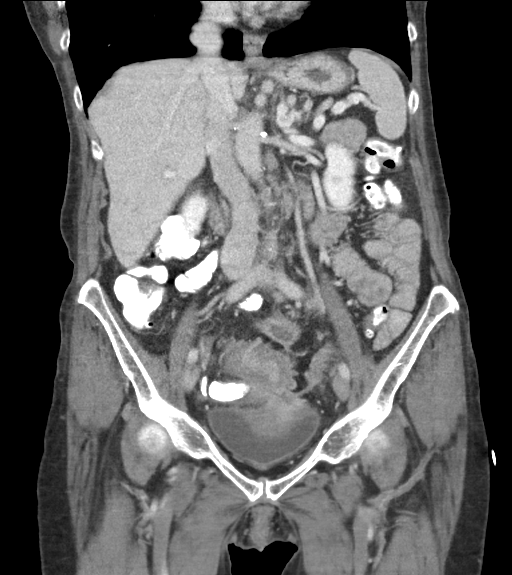
[im 51/91  soft-tissue]
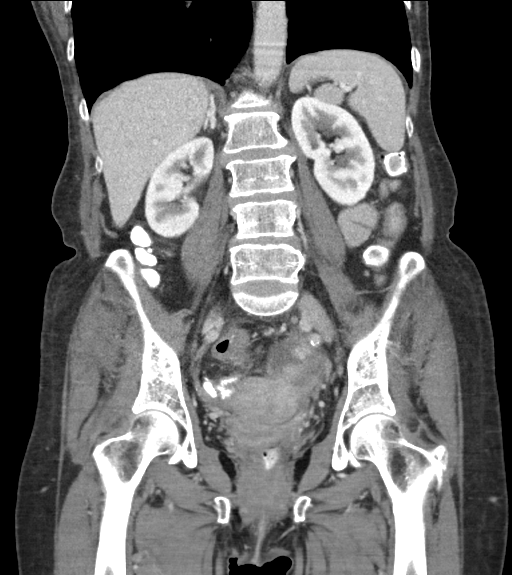

[15 of 46 positions shown; findings below may reference images not displayed]

FINDINGS: Lower chest: Motion artifact limits examination. Mild emphysematous
changes suggested in the lung bases.

Hepatobiliary: Mild periportal edema may be inflammatory or
associated with chronic liver disease or infection. No focal liver
lesions. Portal veins are patent. Gallbladder and bile ducts are
normal.

Pancreas: Unremarkable. No pancreatic ductal dilatation or
surrounding inflammatory changes.

Spleen: Normal in size without focal abnormality.

Adrenals/Urinary Tract: Adrenal glands are unremarkable. Kidneys are
normal, without renal calculi, focal lesion, or hydronephrosis.
Bladder contains gas without wall thickening. Gas in the bladder
could arise from infection, instrumentation, or fistula.

Stomach/Bowel: Stomach, small bowel, and colon are not abnormally
distended. Contrast material flows to the rectum without evidence of
obstruction. Segmental wall thickening and inflammatory changes
involving the sigmoid colon. Scattered colonic diverticular present.
Infiltration in the pericolonic fat with a small loculated
thick-walled collection inside the sigmoid loop measuring about
cm diameter. Radiating strands extend towards the colon, adnexum,
and bladder. Changes are most likely to represent acute
diverticulitis with a pericolonic abscess and local fistulization.
Follow-up after resolution of acute process is recommended to
exclude underlying colonic neoplasm or possibly mesenteric neoplasm.
Appendix is normal.

Vascular/Lymphatic: Aortic atherosclerosis. No enlarged abdominal or
pelvic lymph nodes.

Reproductive: Uterus and ovaries are not enlarged. Cyst on the left
ovary measuring 3.3 cm diameter.

Other: No free air or free fluid in the abdomen. Abdominal wall
musculature appears intact.

Musculoskeletal: No acute or significant osseous findings.
IMPRESSION: 1. Diffuse wall thickening in the sigmoid colon with surrounding
inflammation consistent with diverticulitis. 2.5 cm diameter
pericolonic abscess with radiating stranding. Likely fistula to
bladder and possible fistula to left adnexal region. Follow-up after
resolution of acute process recommended to exclude underlying
neoplasm.
2. 3.3 cm diameter cyst on the left ovary. Recommend follow-up US in
6-12 months. Note: This recommendation does not apply to
premenarchal patients and to those with increased risk (genetic,
family history, elevated tumor markers or other high-risk factors)
of ovarian cancer. Reference: JACR [DATE]):[id number]
3. Aortic atherosclerosis.

## 2023-01-02 DIAGNOSIS — E538 Deficiency of other specified B group vitamins: Secondary | ICD-10-CM | POA: Diagnosis not present

## 2023-01-02 DIAGNOSIS — I10 Essential (primary) hypertension: Secondary | ICD-10-CM | POA: Diagnosis not present

## 2023-01-02 DIAGNOSIS — D649 Anemia, unspecified: Secondary | ICD-10-CM | POA: Diagnosis not present

## 2023-01-02 DIAGNOSIS — E559 Vitamin D deficiency, unspecified: Secondary | ICD-10-CM | POA: Diagnosis not present

## 2023-01-02 DIAGNOSIS — I700 Atherosclerosis of aorta: Secondary | ICD-10-CM | POA: Diagnosis not present

## 2023-01-15 DIAGNOSIS — I10 Essential (primary) hypertension: Secondary | ICD-10-CM | POA: Diagnosis not present

## 2023-01-15 DIAGNOSIS — I4891 Unspecified atrial fibrillation: Secondary | ICD-10-CM | POA: Diagnosis not present

## 2023-01-15 DIAGNOSIS — Z9049 Acquired absence of other specified parts of digestive tract: Secondary | ICD-10-CM | POA: Diagnosis not present

## 2023-01-15 DIAGNOSIS — E538 Deficiency of other specified B group vitamins: Secondary | ICD-10-CM | POA: Diagnosis not present

## 2023-01-15 DIAGNOSIS — E559 Vitamin D deficiency, unspecified: Secondary | ICD-10-CM | POA: Diagnosis not present

## 2023-01-15 DIAGNOSIS — D649 Anemia, unspecified: Secondary | ICD-10-CM | POA: Diagnosis not present

## 2023-01-15 DIAGNOSIS — I700 Atherosclerosis of aorta: Secondary | ICD-10-CM | POA: Diagnosis not present

## 2023-01-15 DIAGNOSIS — Z0000 Encounter for general adult medical examination without abnormal findings: Secondary | ICD-10-CM | POA: Diagnosis not present

## 2023-01-15 DIAGNOSIS — N321 Vesicointestinal fistula: Secondary | ICD-10-CM | POA: Diagnosis not present

## 2023-03-27 ENCOUNTER — Ambulatory Visit: Payer: Medicare Other | Admitting: Internal Medicine

## 2023-04-03 ENCOUNTER — Other Ambulatory Visit: Payer: Self-pay

## 2023-04-03 ENCOUNTER — Encounter (HOSPITAL_COMMUNITY): Payer: Self-pay | Admitting: Internal Medicine

## 2023-04-03 ENCOUNTER — Emergency Department (HOSPITAL_COMMUNITY): Payer: Medicare Other

## 2023-04-03 ENCOUNTER — Inpatient Hospital Stay (HOSPITAL_COMMUNITY)
Admission: EM | Admit: 2023-04-03 | Discharge: 2023-04-09 | DRG: 193 | Disposition: A | Discharge status: Home / Self Care | Payer: Medicare Other | Attending: Family Medicine | Admitting: Family Medicine

## 2023-04-03 DIAGNOSIS — I43 Cardiomyopathy in diseases classified elsewhere: Secondary | ICD-10-CM

## 2023-04-03 DIAGNOSIS — Z8049 Family history of malignant neoplasm of other genital organs: Secondary | ICD-10-CM

## 2023-04-03 DIAGNOSIS — Z823 Family history of stroke: Secondary | ICD-10-CM

## 2023-04-03 DIAGNOSIS — Z9049 Acquired absence of other specified parts of digestive tract: Secondary | ICD-10-CM

## 2023-04-03 DIAGNOSIS — I4819 Other persistent atrial fibrillation: Secondary | ICD-10-CM | POA: Diagnosis present

## 2023-04-03 DIAGNOSIS — I3139 Other pericardial effusion (noninflammatory): Secondary | ICD-10-CM | POA: Diagnosis not present

## 2023-04-03 DIAGNOSIS — Z91048 Other nonmedicinal substance allergy status: Secondary | ICD-10-CM | POA: Diagnosis not present

## 2023-04-03 DIAGNOSIS — F419 Anxiety disorder, unspecified: Secondary | ICD-10-CM | POA: Diagnosis present

## 2023-04-03 DIAGNOSIS — I480 Paroxysmal atrial fibrillation: Secondary | ICD-10-CM | POA: Diagnosis not present

## 2023-04-03 DIAGNOSIS — I081 Rheumatic disorders of both mitral and tricuspid valves: Secondary | ICD-10-CM | POA: Diagnosis not present

## 2023-04-03 DIAGNOSIS — R000 Tachycardia, unspecified: Secondary | ICD-10-CM | POA: Diagnosis not present

## 2023-04-03 DIAGNOSIS — Z79899 Other long term (current) drug therapy: Secondary | ICD-10-CM

## 2023-04-03 DIAGNOSIS — R918 Other nonspecific abnormal finding of lung field: Secondary | ICD-10-CM | POA: Diagnosis not present

## 2023-04-03 DIAGNOSIS — I361 Nonrheumatic tricuspid (valve) insufficiency: Secondary | ICD-10-CM | POA: Diagnosis not present

## 2023-04-03 DIAGNOSIS — Z1152 Encounter for screening for COVID-19: Secondary | ICD-10-CM | POA: Diagnosis not present

## 2023-04-03 DIAGNOSIS — I5021 Acute systolic (congestive) heart failure: Secondary | ICD-10-CM | POA: Diagnosis present

## 2023-04-03 DIAGNOSIS — Z8249 Family history of ischemic heart disease and other diseases of the circulatory system: Secondary | ICD-10-CM

## 2023-04-03 DIAGNOSIS — F17210 Nicotine dependence, cigarettes, uncomplicated: Secondary | ICD-10-CM | POA: Diagnosis not present

## 2023-04-03 DIAGNOSIS — Z7901 Long term (current) use of anticoagulants: Secondary | ICD-10-CM | POA: Diagnosis not present

## 2023-04-03 DIAGNOSIS — I10 Essential (primary) hypertension: Secondary | ICD-10-CM | POA: Diagnosis not present

## 2023-04-03 DIAGNOSIS — I4891 Unspecified atrial fibrillation: Principal | ICD-10-CM

## 2023-04-03 DIAGNOSIS — I110 Hypertensive heart disease with heart failure: Secondary | ICD-10-CM | POA: Diagnosis present

## 2023-04-03 DIAGNOSIS — I517 Cardiomegaly: Secondary | ICD-10-CM | POA: Diagnosis not present

## 2023-04-03 DIAGNOSIS — Z800 Family history of malignant neoplasm of digestive organs: Secondary | ICD-10-CM | POA: Diagnosis not present

## 2023-04-03 DIAGNOSIS — K5900 Constipation, unspecified: Secondary | ICD-10-CM | POA: Diagnosis not present

## 2023-04-03 DIAGNOSIS — Z888 Allergy status to other drugs, medicaments and biological substances status: Secondary | ICD-10-CM

## 2023-04-03 DIAGNOSIS — F410 Panic disorder [episodic paroxysmal anxiety] without agoraphobia: Secondary | ICD-10-CM | POA: Diagnosis not present

## 2023-04-03 DIAGNOSIS — J440 Chronic obstructive pulmonary disease with acute lower respiratory infection: Secondary | ICD-10-CM | POA: Diagnosis present

## 2023-04-03 DIAGNOSIS — R0602 Shortness of breath: Secondary | ICD-10-CM | POA: Diagnosis not present

## 2023-04-03 DIAGNOSIS — I428 Other cardiomyopathies: Secondary | ICD-10-CM

## 2023-04-03 DIAGNOSIS — D649 Anemia, unspecified: Secondary | ICD-10-CM | POA: Diagnosis not present

## 2023-04-03 DIAGNOSIS — I340 Nonrheumatic mitral (valve) insufficiency: Secondary | ICD-10-CM | POA: Diagnosis not present

## 2023-04-03 DIAGNOSIS — J189 Pneumonia, unspecified organism: Secondary | ICD-10-CM | POA: Diagnosis present

## 2023-04-03 DIAGNOSIS — I5031 Acute diastolic (congestive) heart failure: Secondary | ICD-10-CM | POA: Diagnosis not present

## 2023-04-03 LAB — CBC WITH DIFFERENTIAL/PLATELET
Abs Immature Granulocytes: 0.01 10*3/uL (ref 0.00–0.07)
Basophils Absolute: 0.1 10*3/uL (ref 0.0–0.1)
Basophils Relative: 1 %
Eosinophils Absolute: 0.1 10*3/uL (ref 0.0–0.5)
Eosinophils Relative: 2 %
HCT: 41 % (ref 36.0–46.0)
Hemoglobin: 13.4 g/dL (ref 12.0–15.0)
Immature Granulocytes: 0 %
Lymphocytes Relative: 21 %
Lymphs Abs: 1.2 10*3/uL (ref 0.7–4.0)
MCH: 33.1 pg (ref 26.0–34.0)
MCHC: 32.7 g/dL (ref 30.0–36.0)
MCV: 101.2 fL — ABNORMAL HIGH (ref 80.0–100.0)
Monocytes Absolute: 0.5 10*3/uL (ref 0.1–1.0)
Monocytes Relative: 9 %
Neutro Abs: 4 10*3/uL (ref 1.7–7.7)
Neutrophils Relative %: 67 %
Platelets: 227 10*3/uL (ref 150–400)
RBC: 4.05 MIL/uL (ref 3.87–5.11)
RDW: 14.6 % (ref 11.5–15.5)
WBC: 6 10*3/uL (ref 4.0–10.5)
nRBC: 0 % (ref 0.0–0.2)

## 2023-04-03 LAB — URINALYSIS, ROUTINE W REFLEX MICROSCOPIC
Bilirubin Urine: NEGATIVE
Glucose, UA: NEGATIVE mg/dL
Hgb urine dipstick: NEGATIVE
Ketones, ur: 5 mg/dL — AB
Nitrite: NEGATIVE
Protein, ur: 30 mg/dL — AB
Specific Gravity, Urine: 1.02 (ref 1.005–1.030)
pH: 5 (ref 5.0–8.0)

## 2023-04-03 LAB — BASIC METABOLIC PANEL
Anion gap: 11 (ref 5–15)
BUN: 21 mg/dL (ref 8–23)
CO2: 25 mmol/L (ref 22–32)
Calcium: 8.9 mg/dL (ref 8.9–10.3)
Chloride: 99 mmol/L (ref 98–111)
Creatinine, Ser: 0.87 mg/dL (ref 0.44–1.00)
GFR, Estimated: >60 mL/min (ref >60)
Glucose, Bld: 103 mg/dL — ABNORMAL HIGH (ref 70–99)
Potassium: 3.7 mmol/L (ref 3.5–5.1)
Sodium: 135 mmol/L (ref 135–145)

## 2023-04-03 LAB — TROPONIN I (HIGH SENSITIVITY)
Troponin I (High Sensitivity): 6 ng/L (ref <18)
Troponin I (High Sensitivity): 7 ng/L (ref <18)

## 2023-04-03 LAB — SARS CORONAVIRUS 2 BY RT PCR: SARS Coronavirus 2 by RT PCR: NEGATIVE

## 2023-04-03 LAB — BRAIN NATRIURETIC PEPTIDE: B Natriuretic Peptide: 777 pg/mL — ABNORMAL HIGH (ref 0.0–100.0)

## 2023-04-03 LAB — STREP PNEUMONIAE URINARY ANTIGEN: Strep Pneumo Urinary Antigen: NEGATIVE

## 2023-04-03 MED ORDER — CALCIUM CARBONATE ANTACID 500 MG PO CHEW: 1.0000 | CHEWABLE_TABLET | Freq: Every day | ORAL | Status: DC | PRN

## 2023-04-03 MED ORDER — VITAMIN B-12 100 MCG PO TABS
500.0000 ug | ORAL_TABLET | Freq: Every day | ORAL | Status: DC
  Administered 2023-04-04 – 2023-04-09 (×6): 500 ug via ORAL
  Filled 2023-04-03 (×6): qty 5

## 2023-04-03 MED ORDER — SODIUM CHLORIDE 0.9 % IV SOLN
2.0000 g | INTRAVENOUS | Status: AC
  Administered 2023-04-04 – 2023-04-08 (×5): 2 g via INTRAVENOUS
  Filled 2023-04-03 (×5): qty 20

## 2023-04-03 MED ORDER — CHLORHEXIDINE GLUCONATE CLOTH 2 % EX PADS
6.0000 | MEDICATED_PAD | Freq: Every day | CUTANEOUS | Status: DC
  Administered 2023-04-03 – 2023-04-09 (×7): 6 via TOPICAL

## 2023-04-03 MED ORDER — CLONAZEPAM 0.5 MG PO TABS
1.0000 mg | ORAL_TABLET | Freq: Every day | ORAL | Status: DC | PRN
  Administered 2023-04-05: 1 mg via ORAL
  Filled 2023-04-03 (×2): qty 2

## 2023-04-03 MED ORDER — ACETAMINOPHEN 325 MG PO TABS
650.0000 mg | ORAL_TABLET | Freq: Four times a day (QID) | ORAL | Status: DC | PRN
  Administered 2023-04-04 – 2023-04-08 (×4): 650 mg via ORAL
  Filled 2023-04-03 (×4): qty 2

## 2023-04-03 MED ORDER — SODIUM CHLORIDE 0.9 % IV SOLN
1.0000 g | Freq: Once | INTRAVENOUS | Status: AC
  Administered 2023-04-03: 1 g via INTRAVENOUS
  Filled 2023-04-03: qty 10

## 2023-04-03 MED ORDER — DILTIAZEM HCL-DEXTROSE 125-5 MG/125ML-% IV SOLN (PREMIX)
5.0000 mg/h | INTRAVENOUS | Status: DC
  Administered 2023-04-03: 5 mg/h via INTRAVENOUS
  Filled 2023-04-03: qty 125

## 2023-04-03 MED ORDER — FLUTICASONE PROPIONATE 50 MCG/ACT NA SUSP: 2.0000 | Freq: Every day | NASAL | Status: DC | PRN

## 2023-04-03 MED ORDER — ALBUTEROL SULFATE (2.5 MG/3ML) 0.083% IN NEBU: 2.5000 mg | INHALATION_SOLUTION | RESPIRATORY_TRACT | Status: DC | PRN

## 2023-04-03 MED ORDER — METOPROLOL TARTRATE 25 MG PO TABS: 12.5000 mg | ORAL_TABLET | Freq: Two times a day (BID) | ORAL | Status: DC

## 2023-04-03 MED ORDER — DOXYCYCLINE HYCLATE 100 MG PO TABS
100.0000 mg | ORAL_TABLET | Freq: Once | ORAL | Status: AC
  Administered 2023-04-03: 100 mg via ORAL
  Filled 2023-04-03: qty 1

## 2023-04-03 MED ORDER — SODIUM CHLORIDE 0.9 % IV SOLN
500.0000 mg | INTRAVENOUS | Status: DC
  Administered 2023-04-04 – 2023-04-07 (×4): 500 mg via INTRAVENOUS
  Filled 2023-04-03 (×4): qty 5

## 2023-04-03 MED ORDER — ACETAMINOPHEN 650 MG RE SUPP: 650.0000 mg | Freq: Four times a day (QID) | RECTAL | Status: DC | PRN

## 2023-04-03 MED ORDER — METOPROLOL TARTRATE 25 MG PO TABS
25.0000 mg | ORAL_TABLET | Freq: Two times a day (BID) | ORAL | Status: DC
  Administered 2023-04-03 – 2023-04-04 (×3): 25 mg via ORAL
  Filled 2023-04-03 (×3): qty 1

## 2023-04-03 MED ORDER — APIXABAN 5 MG PO TABS
5.0000 mg | ORAL_TABLET | Freq: Two times a day (BID) | ORAL | Status: DC
  Administered 2023-04-03 – 2023-04-09 (×12): 5 mg via ORAL
  Filled 2023-04-03 (×12): qty 1

## 2023-04-03 MED ORDER — DILTIAZEM LOAD VIA INFUSION
10.0000 mg | Freq: Once | INTRAVENOUS | Status: AC
  Administered 2023-04-03: 10 mg via INTRAVENOUS
  Filled 2023-04-03: qty 10

## 2023-04-03 NOTE — ED Notes (Signed)
Unable to give report.  RN will return call

## 2023-04-03 NOTE — ED Notes (Signed)
ED TO INPATIENT HANDOFF REPORT  ED Nurse Name and Phone #: Madalyn Rob [phone]  S Name/Age/Gender Ronnie Doss 67 y.o. female Room/Bed: APA01/APA01  Code Status   Code Status: Full Code  Home/SNF/Other Home Patient oriented to: self,place, time and situation Is this baseline? No   Triage Complete: Triage complete  Chief Complaint Pneumonia [J18.9]  Triage Note Pt complains of bilateral leg swelling, SOB, fatigue and palpitations x 1 week. Swelling and SOB have been getting worse the past couple of days.    Allergies Allergies  Allergen Reactions   Dust Mite Extract Other (See Comments)   Grass Extracts [Gramineae Pollens] Other (See Comments)    Dust, mold, mites Congestion   Grass Pollen(K-O-R-T-Swt Vern) Other (See Comments)   Other Other (See Comments)    3-Hydroxy-3-Methylglutaryl-Coenzyme A Reductase Inhibitor   Statins Other (See Comments)    Stiffness in body    Level of Care/Admitting Diagnosis ED Disposition     ED Disposition  Admit   Condition  --   Comment  Hospital Area: 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   Dehydration 01/04/2021   Dysrhythmia    Elevated MCV 01/04/2021   Gout    Hypertension    Nausea 01/04/2021   PAF (paroxysmal atrial fibrillation) (HCC)    a. 06/2021 in setting of SBO, HypoK/Mg; b. 06/2021  Echo: EF 50-55%, no rwma, GrI DD, Nl RV size/fxn, RVSP 36.73mmHg. Mod dil RA. Triv MR.   Sepsis (HCC) 01/05/2021   Tobacco abuse    Past Surgical History:  Procedure Laterality Date   BLADDER REPAIR N/A 05/17/2021   Procedure: Closure of Cystotomy;  Surgeon: Malen Gauze, MD;  Location: [facility];  Service: Urology;  Laterality: N/A;   CARPAL TUNNEL RELEASE Right 2002   COLONOSCOPY WITH PROPOFOL N/A 09/19/2021   Procedure: COLONOSCOPY WITH PROPOFOL;  Surgeon: Franky Macho, MD;  Location: [facility];  Service: Gastroenterology;  Laterality: N/A;   COLOSTOMY REVERSAL N/A 09/20/2021   Procedure: Small Bowel Resection;  Surgeon: Franky Macho, MD;  Location: [facility];  Service: General;  Laterality: N/A;   MANDIBLE SURGERY  1985   PARTIAL COLECTOMY N/A 05/17/2021   Procedure: PARTIAL COLECTOMY WITH COLOSTOMY;  Surgeon: Franky Macho, MD;  Location: [facility];  Service: General;  Laterality: N/A;   PELVIC LYMPH NODE DISSECTION N/A 05/17/2021   Procedure: PELVIC DISSECTION;  Surgeon: Lazaro Arms, MD;  Location: [facility];  Service: Gynecology;  Laterality: N/A;     A IV Location/Drains/Wounds Patient Lines/Drains/Airways Status     Active Line/Drains/Airways     Name Placement date Placement time Site Days   Peripheral IV 05/27/22 22 G Posterior;Right Forearm 05/27/22  0235  Forearm  311   Peripheral IV 04/03/23 20 G 1" Right Antecubital 04/03/23  1414  Antecubital  less than 1   Colostomy LLQ 05/17/21  1300  LLQ  686  Incision (Closed) 05/17/21 Abdomen Other (Comment) 05/17/21  1327  -- 686   Incision (Closed) 09/20/21 Abdomen Other (Comment) 09/20/21  0902  -- 560            Intake/Output Last 24 hours No intake or output data in the 24 hours ending 04/03/23 1922  Labs/Imaging Results for orders placed or performed during the hospital encounter of 04/03/23 (from the past 48 hour(s))  Basic metabolic panel     Status: Abnormal   Collection Time: 04/03/23 12:32 PM  Result  Value Ref Range   Sodium 135 135 - 145 mmol/L   Potassium 3.7 3.5 - 5.1 mmol/L   Chloride 99 98 - 111 mmol/L   CO2 25 22 - 32 mmol/L   Glucose, Bld 103 (H) 70 - 99 mg/dL    Comment: Glucose reference range applies only to samples taken after fasting for at least 8 hours.   BUN 21 8 - 23 mg/dL   Creatinine, Ser 8.11 0.44 - 1.00 mg/dL   Calcium 8.9 8.9 - 91.4 mg/dL   GFR, Estimated >78 >29 mL/min    Comment: (NOTE) Calculated using the CKD-EPI Creatinine Equation (2021)    Anion gap 11 5 - 15    Comment: Performed at [facility], [address], [place], [place] [address]  CBC with Differential     Status: Abnormal   Collection Time: 04/03/23 12:32 PM  Result Value Ref Range   WBC 6.0 4.0 - 10.5 K/uL   RBC 4.05 3.87 - 5.11 MIL/uL   Hemoglobin 13.4 12.0 - 15.0 g/dL   HCT 08.6 57.8 - 46.9 %   MCV 101.2 (H) 80.0 - 100.0 fL   MCH 33.1 26.0 - 34.0 pg   MCHC 32.7 30.0 - 36.0 g/dL   RDW 62.9 52.8 - 41.3 %   Platelets 227 150 - 400 K/uL   nRBC 0.0 0.0 - 0.2 %   Neutrophils Relative % 67 %   Neutro Abs 4.0 1.7 - 7.7 K/uL   Lymphocytes Relative 21 %   Lymphs Abs 1.2 0.7 - 4.0 K/uL   Monocytes Relative 9 %   Monocytes Absolute 0.5 0.1 - 1.0 K/uL   Eosinophils Relative 2 %   Eosinophils Absolute 0.1 0.0 - 0.5 K/uL   Basophils Relative 1 %   Basophils Absolute 0.1 0.0 - 0.1 K/uL   Immature Granulocytes 0 %   Abs Immature Granulocytes 0.01 0.00 - 0.07 K/uL    Comment: Performed at [facility], [address] [place], [place] [address]  Troponin I (High Sensitivity)     Status: None   Collection Time: 04/03/23 12:32 PM  Result Value Ref Range   Troponin I (High Sensitivity) 6 <18 ng/L    Comment: (NOTE) Elevated high sensitivity troponin I (hsTnI) values and significant  changes across serial measurements may suggest ACS but many other  chronic and acute conditions are known to elevate hsTnI results.  Refer to the "Links" section for chest pain algorithms and additional   guidance. Performed at [facility], [address], [place], [place] [address]   Brain natriuretic peptide     Status: Abnormal   Collection Time: 04/03/23 12:32 PM  Result Value Ref Range   B Natriuretic Peptide 777.0 (H) 0.0 - 100.0 pg/mL    Comment: Performed at [facility], [address] [place], [place] [address]  Troponin I (High Sensitivity)     Status: None   Collection Time: 04/03/23  2:13 PM  Result Value Ref  Range   Troponin I (High Sensitivity) 7 <18 ng/L    Comment: (NOTE) Elevated high sensitivity troponin I (hsTnI) values and significant  changes across serial measurements may suggest ACS but many other  chronic and acute conditions are known to elevate hsTnI results.  Refer to the "Links" section for chest pain algorithms and additional  guidance. Performed at [facility], [address] [place], [place] [address]   SARS Coronavirus 2 by RT PCR (hospital order, performed in [facility] hospital lab) *cepheid single result test* Anterior Nasal Swab     Status: None   Collection Time: 04/03/23  4:50 PM   Specimen: Anterior Nasal Swab  Result Value Ref Range   SARS Coronavirus 2 by RT PCR NEGATIVE NEGATIVE    Comment: (NOTE) SARS-CoV-2 target nucleic acids are NOT DETECTED.  The SARS-CoV-2 RNA is generally detectable in upper and lower respiratory specimens during the acute phase of infection. The lowest concentration of SARS-CoV-2 viral copies this assay can detect is 250 copies / mL. A negative result does not preclude SARS-CoV-2 infection and should not be used as the sole basis for treatment or other patient management decisions.  A negative result may occur with improper specimen collection / handling, submission of specimen other than nasopharyngeal swab, presence of viral mutation(s) within the areas targeted by this assay, and inadequate number of viral copies (<250 copies / mL). A negative result must be combined with clinical observations, patient  history, and epidemiological information.  Fact Sheet for Patients:   RoadLapTop.co.za  Fact Sheet for Healthcare Providers: http://kim-miller.com/  This test is not yet approved or  cleared by the [place] FDA and has been authorized for detection and/or diagnosis of SARS-CoV-2 by FDA under an Emergency Use Authorization (EUA).  This EUA will remain in effect (meaning this test can be used) for the duration of the COVID-19 declaration under Section 564(b)(1) of the Act, 21 U.S.C. section 360bbb-3(b)(1), unless the authorization is terminated or revoked sooner.  Performed at [facility], [address], [place], [place] [address]    DG Chest 2 View  Result Date: 04/03/2023 CLINICAL DATA:  Shortness of breath EXAM: CHEST - 2 VIEW COMPARISON:  Chest radiograph 07/18/2021 FINDINGS: The heart is borderline enlarged. The upper mediastinal contours are normal The lungs hyperinflated with flattening of the diaphragms consistent with underlying COPD. There are patchy opacities in the right middle lobe superimposed on chronically coarsened interstitial markings. There may be a small right pleural effusion. There is no pneumothorax The bones are demineralized. There is age-indeterminate compression deformity a midthoracic vertebral body. IMPRESSION: 1. Suspect right middle lobe pneumonia and possible small right pleural effusion superimposed on underlying COPD. Recommend follow-up radiographs in 6-8 weeks to ensure resolution. 2. Age-indeterminate compression deformity a midthoracic vertebral body. Electronically Signed   By: Lesia Hausen M.D.   On: 04/03/2023 12:15    Pending Labs Unresulted Labs (From admission, onward)     Start     Ordered   04/04/23 0500  Basic metabolic panel  Tomorrow morning,   R        04/03/23 1820   04/04/23 0500  CBC  Tomorrow morning,   R        04/03/23 1820   04/03/23 1857  Urinalysis, Routine w reflex microscopic  -Urine, Clean Catch  Once,   R       Question:  Specimen Source  Answer:  Urine, Clean Catch   04/03/23 1857   04/03/23 1821  Legionella Pneumophila Serogp 1 Ur Ag  (COPD / Pneumonia / Cellulitis / Lower Extremity Wound)  Once,   R        04/03/23 1820   04/03/23 1821  Strep pneumoniae urinary antigen  (COPD / Pneumonia / Cellulitis / Lower Extremity Wound)  Once,   R        04/03/23 1820   04/03/23 1821  Expectorated Sputum Assessment w Gram Stain, Rflx to Resp Cult  (COPD / Pneumonia / Cellulitis / Lower Extremity Wound)  Once,   R        04/03/23 1820            Vitals/Pain Today's Vitals   04/03/23 1700 04/03/23 1730 04/03/23 1800 04/03/23 1900  BP: (!) 123/94 (!) 138/95 (!) 136/109 (!) 121/91  Pulse: 99 98 83 (!) 107  Resp: (!) 24 16 (!) 21 20  Temp:      TempSrc:      SpO2: 95% (!) 89% 99% 93%  Weight:      Height:      PainSc:        Isolation Precautions Airborne and Contact precautions  Medications Medications  diltiazem (CARDIZEM) 1 mg/mL load via infusion 10 mg (10 mg Intravenous Bolus from Bag 04/03/23 1533)    And  diltiazem (CARDIZEM) 125 mg in dextrose 5% 125 mL (1 mg/mL) infusion (5 mg/hr Intravenous New Bag/Given 04/03/23 1533)  metoprolol tartrate (LOPRESSOR) tablet 12.5 mg (has no administration in time range)  calcium carbonate (TUMS - dosed in mg elemental calcium) chewable tablet 200 mg of elemental calcium (has no administration in time range)  apixaban (ELIQUIS) tablet 5 mg (has no administration in time range)  cyanocobalamin (VITAMIN B12) tablet 500 mcg (has no administration in time range)  clonazePAM (KLONOPIN) tablet 1 mg (has no administration in time range)  fluticasone (FLONASE) 50 MCG/ACT nasal spray 2 spray (has no administration in time range)  cefTRIAXone (ROCEPHIN) 2 g in sodium chloride 0.9 % 100 mL IVPB (has no administration in time range)  azithromycin (ZITHROMAX) 500 mg in sodium chloride 0.9 % 250 mL IVPB (has no administration in  time range)  acetaminophen (TYLENOL) tablet 650 mg (has no administration in time range)    Or  acetaminophen (TYLENOL) suppository 650 mg (has no administration in time range)  albuterol (PROVENTIL) (2.5 MG/3ML) 0.083% nebulizer solution 2.5 mg (has no administration in time range)  cefTRIAXone (ROCEPHIN) 1 g in sodium chloride 0.9 % 100 mL IVPB (0 g Intravenous Stopped 04/03/23 1359)  doxycycline (VIBRA-TABS) tablet 100 mg (100 mg Oral Given 04/03/23 1333)    Mobility walks     Focused Assessments Cardiac Assessment Handoff:    No results found for: "CKTOTAL", "CKMB", "CKMBINDEX", "TROPONINI" No results found for: "DDIMER" Does the Patient currently have chest pain? No    R Recommendations: See Admitting Provider Note  Report given to:   Additional Notes: Pt is alert and oriented and very cooperative. Pt continues to be in afib w/ an average hr of 90.

## 2023-04-03 NOTE — H&P (Signed)
[facility] H&P   Patient Demographics:    Brandi Rowe, is a 67 y.o. female  MRN: [id number]   DOB - 1956-06-10  Admit Date - 04/03/2023  Outpatient Primary MD for the patient is Benita Stabile, MD  Referring MD/NP/PA: Dr Rhae Hammock  Outpatient Specialists: Cardiology Dr. Millipeddi/Hilty  Patient coming from: Home  Chief Complaint  Patient presents with   Shortness of Breath   Leg Swelling   Palpitations   Chest Pain      HPI:    Brandi Rowe  is a 67 y.o. 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 -Patient presents today secondary to dyspnea, lower extremity edema, palpitation, presents to ED secondary to palpitation, lightheadedness, dyspnea with exertion, as well orthopnea, progressive over the last few weeks, reports she is compliant with her metoprolol and Eliquis, no abdominal pain, no nausea, no vomiting, does endorse some intermittent chest pain, in the past but nothing currently. -In ED she was noted to be in A-fib with RVR heart rate in the 130s for which she was started on Cardizem drip, her chest x-ray significant for mid right lung opacity concerning for pneumonia, small pleural effusion, she had +3 edema, with elevated BNP 777, [facility] hospitalist consulted to admit for pneumonia and CHF.   Review of systems:     A full 10 point Review of Systems was done, except as stated above, all other Review of Systems were negative.   With Past History of the following :    Past Medical History:  Diagnosis Date   Acute febrile illness 01/04/2021   Anxiety    Colovaginal fistula    Colovesical fistula    a.  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   Dehydration 01/04/2021   Dysrhythmia    Elevated MCV 01/04/2021   Gout    Hypertension    Nausea 01/04/2021   PAF (paroxysmal atrial fibrillation) (HCC)    a. 06/2021 in setting of SBO, HypoK/Mg; b. 06/2021 Echo: EF 50-55%, no rwma, GrI DD, Nl RV size/fxn, RVSP 36.58mmHg. Mod dil RA. Triv MR.   Sepsis (HCC) 01/05/2021   Tobacco abuse       Past Surgical History:  Procedure Laterality Date   BLADDER REPAIR N/A 05/17/2021   Procedure: Closure of Cystotomy;  Surgeon: Malen Gauze, MD;  Location: [facility];  Service: Urology;  Laterality: N/A;   CARPAL TUNNEL RELEASE Right 2002   COLONOSCOPY WITH PROPOFOL N/A 09/19/2021   Procedure: COLONOSCOPY WITH PROPOFOL;  Surgeon: Franky Macho, MD;  Location: [facility];  Service: Gastroenterology;  Laterality: N/A;   COLOSTOMY REVERSAL N/A 09/20/2021   Procedure: Small Bowel Resection;  Surgeon: Franky Macho, MD;  Location:  [facility];  Service: General;  Laterality: N/A;   MANDIBLE SURGERY  1985   PARTIAL COLECTOMY N/A 05/17/2021   Procedure: PARTIAL COLECTOMY WITH COLOSTOMY;  Surgeon: Franky Macho, MD;  Location: [facility];  Service: General;  Laterality: N/A;   PELVIC LYMPH NODE DISSECTION N/A 05/17/2021   Procedure: PELVIC DISSECTION;  Surgeon: Lazaro Arms, MD;  Location: [facility];  Service: Gynecology;  Laterality: N/A;      Social History:     Social History   Tobacco Use   Smoking status: Every Day    Current packs/day: 0.50    Average packs/day: 0.5 packs/day for 46.0 years (23.0 ttl pk-yrs)    Types: Cigarettes   Smokeless tobacco: Never  Substance Use Topics   Alcohol use: Yes    Alcohol/week: 7.0 standard drinks of alcohol    Types: 7 Glasses of wine per week       Family History :     Family History  Problem Relation Age of Onset   Congestive Heart Failure Mother    Uterine cancer Mother    Pancreatic cancer Father    Atrial fibrillation Brother    Stroke Maternal  Grandmother    Heart disease Maternal Grandmother    Glaucoma Maternal Grandfather       Home Medications:   Prior to Admission medications   Medication Sig Start Date End Date Taking? Authorizing Provider  acetaminophen (TYLENOL) 500 MG tablet Take 500 mg by mouth 2 (two) times daily as needed for moderate pain or headache.   Yes [provider]  apixaban (ELIQUIS) 5 MG TABS tablet Take 1 tablet (5 mg total) by mouth 2 (two) times daily. 09/21/22  Yes Mallipeddi, Vishnu P, MD  calcium carbonate (TUMS - DOSED IN MG ELEMENTAL CALCIUM) 500 MG chewable tablet Chew 1 tablet by mouth daily as needed for indigestion or heartburn.   Yes [provider]  clonazePAM (KLONOPIN) 1 MG tablet Take 1 tablet (1 mg total) by mouth daily as needed for anxiety. 07/27/21  Yes Cathlean Marseilles A, NP  cyanocobalamin (VITAMIN B12) 1000 MCG tablet Take by mouth every other day. 04/30/22  Yes [provider]  diphenhydrAMINE (BENADRYL) 25 MG tablet Take 25 mg by mouth at bedtime as needed for allergies.   Yes [provider]  fluticasone (FLONASE) 50 MCG/ACT nasal spray Place 2 sprays into both nostrils daily as needed for allergies.   Yes [provider]  loperamide (IMODIUM) 2 MG capsule as needed for diarrhea or loose stools.   Yes [provider]  metoprolol tartrate (LOPRESSOR) 25 MG tablet Take 0.5 tablets (12.5 mg total) by mouth 2 (two) times daily. 09/21/22 09/16/23 Yes Mallipeddi, Vishnu P, MD  Polyvinyl Alcohol-Povidone (REFRESH OP) Place 1 drop into both eyes daily as needed (dry eyes).   Yes [provider]     Allergies:     Allergies  Allergen Reactions   Dust Mite Extract Other (See Comments)   Grass Extracts [Gramineae Pollens] Other (See Comments)    Dust, mold, mites Congestion   Grass Pollen(K-O-R-T-Swt Vern) Other (See Comments)   Other Other (See Comments)    3-Hydroxy-3-Methylglutaryl-Coenzyme A Reductase Inhibitor   Statins  Other (See Comments)    Stiffness in body     Physical Exam:   Vitals  Blood pressure (!) 140/114, pulse (!) 127, temperature (!) 97.3 F (36.3 C), temperature source Axillary, resp. rate (!) 26, height 5\' 4"  (1.626 m), weight 52.2 kg, SpO2 96%.   1.  General Well-developed Female, laying in bed, no apparent distress  2. Normal affect and insight, Not Suicidal or Homicidal, Awake Alert, Oriented X 3.  3. No F.N deficits, ALL C.Nerves Intact, Strength 5/5 all 4 extremities, Sensation intact all 4 extremities, Plantars down going.  4. Ears and Eyes appear Normal, Conjunctivae clear, PERRLA. Moist Oral Mucosa.  5. Supple Neck, No JVD, No cervical lymphadenopathy appriciated, No Carotid Bruits.  6. Symmetrical Chest wall movement, , currently at the bases with scattered Rales  7.  Cardiac, irregular, No Gallops, Rubs or Murmurs, No Parasternal Heave.  +3 edema  8. Positive Bowel Sounds, Abdomen Soft, No tenderness, No organomegaly appriciated,No rebound -guarding or rigidity.  9.  No Cyanosis, Normal Skin Turgor, No Skin Rash or Bruise.  10. Good muscle tone,  joints appear normal , no effusions, Normal ROM.     Data Review:    CBC Recent Labs  Lab 04/03/23 1232  WBC 6.0  HGB 13.4  HCT 41.0  PLT 227  MCV 101.2*  MCH 33.1  MCHC 32.7  RDW 14.6  LYMPHSABS 1.2  MONOABS 0.5  EOSABS 0.1  BASOSABS 0.1   ------------------------------------------------------------------------------------------------------------------  Chemistries  Recent Labs  Lab 04/03/23 1232  NA 135  K 3.7  CL 99  CO2 25  GLUCOSE 103*  BUN 21  CREATININE 0.87  CALCIUM 8.9   ------------------------------------------------------------------------------------------------------------------ estimated creatinine clearance is 51 mL/min (by C-G formula based on SCr of 0.87 mg/dL). ------------------------------------------------------------------------------------------------------------------ No  results for input(s): "TSH", "T4TOTAL", "T3FREE", "THYROIDAB" in the last 72 hours.  Invalid input(s): "FREET3"  Coagulation profile No results for input(s): "INR", "PROTIME" in the last 168 hours. ------------------------------------------------------------------------------------------------------------------- No results for input(s): "DDIMER" in the last 72 hours. -------------------------------------------------------------------------------------------------------------------  Cardiac Enzymes No results for input(s): "CKMB", "TROPONINI", "MYOGLOBIN" in the last 168 hours.  Invalid input(s): "CK" ------------------------------------------------------------------------------------------------------------------    Component Value Date/Time   BNP 777.0 (H) 04/03/2023 1232     ---------------------------------------------------------------------------------------------------------------  Urinalysis    Component Value Date/Time   COLORURINE YELLOW 05/25/2022 1425   APPEARANCEUR CLEAR 05/25/2022 1425   APPEARANCEUR Clear 07/26/2021 1003   LABSPEC 1.010 05/25/2022 1425   PHURINE 5.0 05/25/2022 1425   GLUCOSEU NEGATIVE 05/25/2022 1425   HGBUR MODERATE (A) 05/25/2022 1425   BILIRUBINUR NEGATIVE 05/25/2022 1425   BILIRUBINUR Negative 07/26/2021 1003   KETONESUR NEGATIVE 05/25/2022 1425   PROTEINUR NEGATIVE 05/25/2022 1425   NITRITE NEGATIVE 05/25/2022 1425   LEUKOCYTESUR NEGATIVE 05/25/2022 1425    ----------------------------------------------------------------------------------------------------------------   Imaging Results:    DG Chest 2 View  Result Date: 04/03/2023 CLINICAL DATA:  Shortness of breath EXAM: CHEST - 2 VIEW COMPARISON:  Chest radiograph 07/18/2021 FINDINGS: The heart is borderline enlarged. The upper mediastinal contours are normal The lungs hyperinflated with flattening of the diaphragms consistent with underlying COPD. There are patchy opacities in the  right middle lobe superimposed on chronically coarsened interstitial markings. There may be a small right pleural effusion. There is no pneumothorax The bones are demineralized. There is age-indeterminate compression deformity a midthoracic vertebral body. IMPRESSION: 1. Suspect right middle lobe pneumonia and possible small right pleural effusion superimposed on underlying COPD. Recommend follow-up radiographs in 6-8 weeks to ensure resolution. 2. Age-indeterminate compression deformity a midthoracic vertebral body. Electronically Signed   By: Lesia Hausen M.D.   On: 04/03/2023 12:15    EKG:  Vent. rate 136 BPM PR interval * ms QRS duration 74 ms QT/QTcB 296/445 ms P-R-T axes * 75 262 Atrial fibrillation with rapid ventricular response ST &  T wave abnormality, consider inferolateral ischemia Abnormal ECG   Assessment & Plan:    Principal Problem:   Pneumonia Active Problems:   Anxiety disorder, unspecified   Paroxysmal atrial fibrillation (HCC)  Paroxysmal A-fib with RVR -Patient with known history of paroxysmal A-fib, she is on home metoprolol, and Eliquis for anticoagulation-heart rate uncontrolled in the setting of acute illness -Will check TSH -Will repeat 2D echo  -Continue with Cardizem drip, admit to stepdown  Pneumonia -Reports cough, congestion, evidence of pneumonia on x-ray, continue with IV Rocephin, azithromycin, follow on sputum culture, Legionella and strep pneumonia antigen. -Will need repeat imaging in 6-8 weeks to ensure resolution of pneumonia after appropriate antibiotic treatment   Acute on chronic diastolic (possible systolic CHF) -2D echo in 2022 with a preserved EF, but grade 1 diastolic dysfunction, patient appears to be with massive volume overload, +3 edema lower extremity edema, and BNP elevated at 777, this is most likely in setting of acute infection and A-fib with RVR -I will hold on diuresis given acute infection, soft blood pressure -Will repeat 2D  echo -Will consult cardiology -Continue daily weight and strict ins and out   history tobacco use disorder - patient quit smoking 5 weeks ago, she was congratulated about that   Anxiety disorder, unspecified Continue home dose clonazepam     DVT Prophylaxis eliquis  AM Labs Ordered, also please review Full Orders  Family Communication: Admission, patients condition and plan of care including tests being ordered have been discussed with the patient and husband at bedside who indicate understanding and agree with the plan and Code Status.  Code Status full  Likely DC to  home  Condition GUARDED    Consults called: none    Admission status: inpatinet    Time spent in minutes : 70 minutes   Huey Bienenstock M.D on 04/03/2023 at 4:49 PM   [facility] - Office  [phone]

## 2023-04-03 NOTE — ED Triage Notes (Signed)
Pt complains of bilateral leg swelling, SOB, fatigue and palpitations x 1 week. Swelling and SOB have been getting worse the past couple of days.

## 2023-04-03 NOTE — ED Notes (Signed)
Gave pt ginger ale and some peanut butter and crackers. Gave spouse a ginger ale.

## 2023-04-03 NOTE — ED Notes (Signed)
Assumed care of pt, found her in bed watching TV. She is alert and oriented and denies any pain at this time.  She only reports weakness. Pt reports she ate dinner earlier w her spouse.

## 2023-04-03 NOTE — ED Notes (Signed)
RN attempted to give report again.

## 2023-04-03 NOTE — ED Provider Notes (Signed)
[place] EMERGENCY DEPARTMENT AT [facility] Provider Note   CSN: [id number] Arrival date & time: 04/03/23  1103     History  Chief Complaint  Patient presents with   Shortness of Breath   Leg Swelling   Palpitations   Chest Pain    Brandi Rowe is a 67 y.o. female.  67 year old female with past medical history of anemia and atrial fibrillation in the past presenting to the emergency department today with concern for palpitations, lightheadedness, dyspnea on exertion now for the past few weeks.  The patient is currently on metoprolol and Eliquis.  She states that she has not taken her dose this morning but did take it last night.  She denies any blood in her stool or dark stools.  She denies any significant abdominal pain or vomiting.  She does report decreased appetite over the past few weeks.  She has been having intermittent chest pain but denies any currently.  She came to the emergency department today for further evaluation regarding this.     Shortness of Breath Associated symptoms: chest pain   Palpitations Associated symptoms: chest pain and shortness of breath   Chest Pain Associated symptoms: palpitations and shortness of breath         Home Medications Prior to Admission medications   Medication Sig Start Date End Date Taking? Authorizing Provider  acetaminophen (TYLENOL) 500 MG tablet Take 500 mg by mouth 2 (two) times daily as needed for moderate pain or headache.    [provider]  apixaban (ELIQUIS) 5 MG TABS tablet Take 1 tablet (5 mg total) by mouth 2 (two) times daily. 09/21/22   Mallipeddi, Vishnu P, MD  calcium carbonate (TUMS - DOSED IN MG ELEMENTAL CALCIUM) 500 MG chewable tablet Chew 1 tablet by mouth daily as needed for indigestion or heartburn.    [provider]  clindamycin (CLEOCIN) 150 MG capsule Take 1 capsule by mouth 3 (three) times daily.    [provider]  clonazePAM (KLONOPIN) 1 MG tablet Take 1  tablet (1 mg total) by mouth daily as needed for anxiety. 07/27/21   Valentino Nose, NP  Cyanocobalamin (B-12 PO) Take 1 tablet by mouth daily.    [provider]  cyanocobalamin (VITAMIN B12) 1000 MCG tablet Take by mouth every other day. 04/30/22   [provider]  diltiazem (CARDIZEM CD) 120 MG 24 hr capsule Take 120 mg by mouth daily.    [provider]  diphenhydrAMINE (BENADRYL) 25 MG tablet Take 25 mg by mouth at bedtime as needed for allergies.    [provider]  ferrous sulfate 325 (65 FE) MG EC tablet Take 1 tablet (325 mg total) by mouth 2 (two) times daily. Patient taking differently: Take 325 mg by mouth every other day. 05/20/21 09/21/22  Franky Macho, MD  fluticasone (FLONASE) 50 MCG/ACT nasal spray Place 2 sprays into both nostrils daily as needed for allergies.    [provider]  folic acid (FOLVITE) 400 MCG tablet Take 400 mcg by mouth every other day.    [provider]  loperamide (IMODIUM) 2 MG capsule as needed for diarrhea or loose stools.    [provider]  metoprolol tartrate (LOPRESSOR) 25 MG tablet Take 0.5 tablets (12.5 mg total) by mouth 2 (two) times daily. 09/21/22 09/16/23  Mallipeddi, Vishnu P, MD  ondansetron (ZOFRAN) 4 MG tablet Take 1 tablet (4 mg total) by mouth every 8 (eight) hours as needed for nausea or  vomiting. 01/04/22   Franky Macho, MD  Polyvinyl Alcohol-Povidone (REFRESH OP) Place 1 drop into both eyes daily as needed (dry eyes).    [provider]      Allergies    Dust mite extract, Grass extracts [gramineae pollens], Grass pollen(k-o-r-t-swt vern), and Other    Review of Systems   Review of Systems  Respiratory:  Positive for shortness of breath.   Cardiovascular:  Positive for chest pain and palpitations.    Physical Exam Updated Vital Signs BP (!) 140/114   Pulse (!) 127   Temp 97.8 F (36.6 C) (Oral)   Resp (!) 26   Ht 5\' 4"  (1.626 m)   Wt 52.2 kg   SpO2 96%    BMI 19.74 kg/m  Physical Exam Vitals and nursing note reviewed.   Gen: NAD Eyes: PERRL, EOMI HEENT: no oropharyngeal swelling Neck: trachea midline Resp: Coarse breath sounds noted in right and left middle lung fields Card: RRR, no murmurs, rubs, or gallops Abd: nontender, nondistended Extremities: no calf tenderness, no edema Vascular: 2+ radial pulses bilaterally, 2+ DP pulses bilaterally Skin: no rashes Psyc: acting appropriately   ED Results / Procedures / Treatments   Labs (all labs ordered are listed, but only abnormal results are displayed) Labs Reviewed  BASIC METABOLIC PANEL - Abnormal; Notable for the following components:      Result Value   Glucose, Bld 103 (*)    All other components within normal limits  CBC WITH DIFFERENTIAL/PLATELET - Abnormal; Notable for the following components:   MCV 101.2 (*)    All other components within normal limits  BRAIN NATRIURETIC PEPTIDE - Abnormal; Notable for the following components:   B Natriuretic Peptide 777.0 (*)    All other components within normal limits  TROPONIN I (HIGH SENSITIVITY)  TROPONIN I (HIGH SENSITIVITY)    EKG None  Radiology DG Chest 2 View  Result Date: 04/03/2023 CLINICAL DATA:  Shortness of breath EXAM: CHEST - 2 VIEW COMPARISON:  Chest radiograph 07/18/2021 FINDINGS: The heart is borderline enlarged. The upper mediastinal contours are normal The lungs hyperinflated with flattening of the diaphragms consistent with underlying COPD. There are patchy opacities in the right middle lobe superimposed on chronically coarsened interstitial markings. There may be a small right pleural effusion. There is no pneumothorax The bones are demineralized. There is age-indeterminate compression deformity a midthoracic vertebral body. IMPRESSION: 1. Suspect right middle lobe pneumonia and possible small right pleural effusion superimposed on underlying COPD. Recommend follow-up radiographs in 6-8 weeks to ensure  resolution. 2. Age-indeterminate compression deformity a midthoracic vertebral body. Electronically Signed   By: Lesia Hausen M.D.   On: 04/03/2023 12:15    Procedures Procedures    Medications Ordered in ED Medications  diltiazem (CARDIZEM) 1 mg/mL load via infusion 10 mg (10 mg Intravenous Bolus from Bag 04/03/23 1533)    And  diltiazem (CARDIZEM) 125 mg in dextrose 5% 125 mL (1 mg/mL) infusion (5 mg/hr Intravenous New Bag/Given 04/03/23 1533)  cefTRIAXone (ROCEPHIN) 1 g in sodium chloride 0.9 % 100 mL IVPB (0 g Intravenous Stopped 04/03/23 1359)  doxycycline (VIBRA-TABS) tablet 100 mg (100 mg Oral Given 04/03/23 1333)    ED Course/ Medical Decision Making/ A&P                                 Medical Decision Making 67 year old female with past medical history of atrial fibrillation and anemia presents  emergency department today with shortness of breath and palpitations over the past few weeks.  I will further evaluate the patient here with a cardiac workup in addition to a chest x-ray to eval for underlying pneumonia, pulmonary edema, or pneumothorax.  Initial EKG interpreted by me does show atrial fibrillation with RVR with nonspecific ST-T changes.  I will start the patient on Cardizem bolus and infusion here.  I will reevaluate for ultimate disposition.  The patient's labs are largely unremarkable.  She does have a right middle lobe infiltrate on x-ray with right pleural effusion.  The patient is covered with Rocephin and doxycycline.  A Cardizem bolus and infusion is initiated.  Calls placed to hospitalist service for admission.  The patient is given Rocephin and doxycycline.  She is admitted for further evaluation and management.  Critical care time 35 minutes including reassessments, review old records, treatment of atrial fibrillation with RVR with IV diltiazem, coordination care with hospitalist service  Amount and/or Complexity of Data Reviewed Labs: ordered.  Risk Prescription  drug management. Decision regarding hospitalization.           Final Clinical Impression(s) / ED Diagnoses Final diagnoses:  Atrial fibrillation with RVR (HCC)  Community acquired pneumonia, unspecified laterality    Rx / DC Orders ED Discharge Orders     None         Durwin Glaze, MD 04/03/23 1554

## 2023-04-03 NOTE — ED Notes (Signed)
Dr. Rhae Hammock verbally ordered to keep cardizem infusion at 5

## 2023-04-03 NOTE — ED Notes (Signed)
Pt IV removed due to pain and burning at site.   Second IV placed 20 g R AC, second trop drawn, IV flushed and saline locked.

## 2023-04-04 ENCOUNTER — Other Ambulatory Visit (HOSPITAL_COMMUNITY): Payer: Self-pay | Admitting: *Deleted

## 2023-04-04 ENCOUNTER — Inpatient Hospital Stay (HOSPITAL_COMMUNITY): Payer: Medicare Other

## 2023-04-04 DIAGNOSIS — I4891 Unspecified atrial fibrillation: Secondary | ICD-10-CM | POA: Diagnosis not present

## 2023-04-04 DIAGNOSIS — I5031 Acute diastolic (congestive) heart failure: Secondary | ICD-10-CM | POA: Diagnosis not present

## 2023-04-04 DIAGNOSIS — J189 Pneumonia, unspecified organism: Secondary | ICD-10-CM | POA: Diagnosis not present

## 2023-04-04 DIAGNOSIS — I480 Paroxysmal atrial fibrillation: Secondary | ICD-10-CM | POA: Diagnosis not present

## 2023-04-04 DIAGNOSIS — I4819 Other persistent atrial fibrillation: Secondary | ICD-10-CM | POA: Diagnosis not present

## 2023-04-04 LAB — CBC
HCT: 35.4 % — ABNORMAL LOW (ref 36.0–46.0)
Hemoglobin: 11.6 g/dL — ABNORMAL LOW (ref 12.0–15.0)
MCH: 33.1 pg (ref 26.0–34.0)
MCHC: 32.8 g/dL (ref 30.0–36.0)
MCV: 101.1 fL — ABNORMAL HIGH (ref 80.0–100.0)
Platelets: 182 10*3/uL (ref 150–400)
RBC: 3.5 MIL/uL — ABNORMAL LOW (ref 3.87–5.11)
RDW: 14.4 % (ref 11.5–15.5)
WBC: 5.1 10*3/uL (ref 4.0–10.5)
nRBC: 0 % (ref 0.0–0.2)

## 2023-04-04 LAB — ECHOCARDIOGRAM COMPLETE
Area-P 1/2: 5.02 cm2
Height: 64 in
S' Lateral: 3 cm
Weight: 1968 [oz_av]

## 2023-04-04 LAB — LEGIONELLA PNEUMOPHILA SEROGP 1 UR AG: L. pneumophila Serogp 1 Ur Ag: NEGATIVE

## 2023-04-04 LAB — BASIC METABOLIC PANEL
Anion gap: 10 (ref 5–15)
BUN: 18 mg/dL (ref 8–23)
CO2: 26 mmol/L (ref 22–32)
Calcium: 8.9 mg/dL (ref 8.9–10.3)
Chloride: 98 mmol/L (ref 98–111)
Creatinine, Ser: 0.86 mg/dL (ref 0.44–1.00)
GFR, Estimated: >60 mL/min (ref >60)
Glucose, Bld: 107 mg/dL — ABNORMAL HIGH (ref 70–99)
Potassium: 3.8 mmol/L (ref 3.5–5.1)
Sodium: 134 mmol/L — ABNORMAL LOW (ref 135–145)

## 2023-04-04 LAB — MRSA NEXT GEN BY PCR, NASAL: MRSA by PCR Next Gen: NOT DETECTED

## 2023-04-04 LAB — TSH: TSH: 1.631 u[IU]/mL (ref 0.350–4.500)

## 2023-04-04 MED ORDER — ONDANSETRON HCL 4 MG PO TABS
4.0000 mg | ORAL_TABLET | Freq: Four times a day (QID) | ORAL | Status: DC | PRN
  Administered 2023-04-04 – 2023-04-08 (×3): 4 mg via ORAL
  Filled 2023-04-04 (×4): qty 1

## 2023-04-04 MED ORDER — FUROSEMIDE 10 MG/ML IJ SOLN
20.0000 mg | Freq: Once | INTRAMUSCULAR | Status: AC
  Administered 2023-04-04: 20 mg via INTRAVENOUS
  Filled 2023-04-04: qty 2

## 2023-04-04 NOTE — Hospital Course (Signed)
67 y.o. 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 -Patient presents today secondary to dyspnea, lower extremity edema, palpitation, presents to ED secondary to palpitation, lightheadedness, dyspnea with exertion, as well orthopnea, progressive over the last few weeks, reports she is compliant with her metoprolol and Eliquis, no abdominal pain, no nausea, no vomiting, does endorse some intermittent chest pain, in the past but nothing currently. -In ED she was noted to be in A-fib with RVR heart rate in the 130s for which she was started on Cardizem drip, her chest x-ray significant for mid right lung opacity concerning for pneumonia, small pleural effusion, she had +3 edema, with elevated BNP 777, [facility] hospitalist consulted to admit for pneumonia and CHF.

## 2023-04-04 NOTE — Progress Notes (Signed)
   04/04/23 0847  ReDS Vest / Clip  BMI (Calculated) 21.1  Station Marker A  Ruler Value 30  ReDS Value Range < 36  ReDS Actual Value 28  Anatomical Comments sitting up in bed

## 2023-04-04 NOTE — Progress Notes (Signed)
PROGRESS NOTE   RAELIN DELEO  ZOX:[id number] DOB: 1955/10/19 DOA: 04/03/2023 PCP: Benita Stabile, MD   Chief Complaint  Patient presents with   Shortness of Breath   Leg Swelling   Palpitations   Chest Pain   Level of care: Telemetry  Brief Admission History:  67 y.o. 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 -Patient presents today secondary to dyspnea, lower extremity edema, palpitation, presents to ED secondary to palpitation, lightheadedness, dyspnea with exertion, as well orthopnea, progressive over the last few weeks, reports she is compliant with her metoprolol and Eliquis, no abdominal pain, no nausea, no vomiting, does endorse some intermittent chest pain, in the past but nothing currently. -In ED she was noted to be in A-fib with RVR heart rate in the 130s for which she was started on Cardizem drip, her chest x-ray significant for mid right lung opacity concerning for pneumonia, small pleural effusion, she had +3 edema, with elevated BNP 777, [facility] hospitalist consulted to admit for pneumonia and CHF.   Assessment and Plan:  Paroxysmal A-fib with RVR -Patient with known history of paroxysmal A-fib, she is on home metoprolol, and apixaban for anticoagulation-heart rate uncontrolled in the setting of acute illness - metoprolol 25 mg BID started by cardiology  - continue apixaban for full anticoagulation   Community Acquired Pneumonia -continue IV Rocephin, azithromycin, follow on sputum culture, Legionella and strep pneumonia antigen. -Will need repeat imaging in 6-8 weeks to ensure resolution of pneumonia after appropriate antibiotic treatment    Acute HFrEF  -2D echo in 2022 with a preserved EF, but grade  1 diastolic dysfunction, patient appears to be with massive volume overload, +3 edema lower extremity edema, and BNP elevated at 777, this is most likely in setting of acute infection and A-fib with RVR -I will hold on diuresis given acute infection, soft blood pressure -Will repeat 2D echo -appreciate cardiology recs, given IV furosemide 20 mg x 1 dose  -Continue daily weight and strict ins and out -redsVest reading 28   Echocardiogram 04/04/23: Left ventricular ejection fraction, by estimation, is 45 to 50%. The left ventricle has mildly decreased function. The left ventricle demonstrates global hypokinesis. Left ventricular diastolic parameters are indeterminate.   2. Right ventricular systolic function is normal. The right ventricular size is normal. There is mildly elevated pulmonary artery systolic pressure. The estimated right ventricular systolic pressure is 43.9 mmHg.   3. Left atrial size was moderately dilated.   4. Right atrial size was moderately dilated.   5. A small pericardial effusion is present. The pericardial effusion is posterior to the left ventricle.   6. The mitral valve is grossly normal. Mild mitral valve regurgitation.   7. Tricuspid valve regurgitation is moderate.   8. The aortic valve is tricuspid. Aortic valve regurgitation is not visualized.   9. The inferior vena cava is dilated in size with <50% respiratory variability, suggesting right atrial pressure of 15 mmHg.   Comparison(s): Prior images reviewed side by side. LVEF mildly reduced at 45-50% in the setting of atrial fibrillation. Mildly elevated estimated RVSP. Mild mitral and moderate tricuspid regurgitation.   history tobacco use disorder - patient quit smoking 5 weeks ago   Anxiety disorder, unspecified Continue home dose clonazepam    DVT prophylaxis: apixaban  Code Status:  Family Communication:  Disposition: anticipate home    Consultants:  cardiology Procedures:   Antimicrobials:     Subjective: Pt reports SOB, no palpitation, still having edema in legs and feet  Objective: Vitals:   04/04/23 1110 04/04/23 1200 04/04/23 1300 04/04/23 1653  BP:   (!) 149/94   Pulse:  94 94   Resp:  (!) 21 (!) 27   Temp: 97.7 F (36.5 C)   97.9 F (36.6 C)  TempSrc: Oral   Oral  SpO2:  95% 96%   Weight:      Height:        Intake/Output Summary (Last 24 hours) at 04/04/2023 1724 Last data filed at 04/04/2023 1718 Gross per 24 hour  Intake 457.21 ml  Output --  Net 457.21 ml   Filed Weights   04/03/23 1158 04/03/23 2046 04/04/23 0847  Weight: 52.2 kg 56.1 kg 55.8 kg   Examination:  General exam: Appears calm and comfortable  Respiratory system: rare anterior crackles heard.  Respiratory effort normal. Cardiovascular system: irregularly irregular normal S1 & S2 heard. No JVD, murmurs, rubs, gallops or clicks. No pedal edema. Gastrointestinal system: Abdomen is nondistended, soft and nontender. No organomegaly or masses felt. Normal bowel sounds heard. Central nervous system: Alert and oriented. No focal neurological deficits. Extremities: 1+ edema BLEs, Symmetric 5 x 5 power. Skin: No rashes, lesions or ulcers. Psychiatry: Judgement and insight appear normal. Mood & affect appropriate.   Data Reviewed: I have personally reviewed following labs and imaging studies  CBC: Recent Labs  Lab 04/03/23 1232 04/04/23 0459  WBC 6.0 5.1  NEUTROABS 4.0  --   HGB 13.4 11.6*  HCT 41.0 35.4*  MCV 101.2* 101.1*  PLT 227 182    Basic Metabolic Panel: Recent Labs  Lab 04/03/23 1232 04/04/23 0459  NA 135 134*  K 3.7 3.8  CL 99 98  CO2 25 26  GLUCOSE 103* 107*  BUN 21 18  CREATININE 0.87 0.86  CALCIUM 8.9 8.9    CBG: No results for input(s): "GLUCAP" in the last 168 hours.  Recent Results (from the past 240 hour(s))  SARS Coronavirus 2 by RT PCR (hospital order, performed in [facility] hospital lab) *cepheid single result test* Anterior Nasal Swab     Status:  None   Collection Time: 04/03/23  4:50 PM   Specimen: Anterior Nasal Swab  Result Value Ref Range Status   SARS Coronavirus 2 by RT PCR NEGATIVE NEGATIVE Final    Comment: (NOTE) SARS-CoV-2 target nucleic acids are NOT DETECTED.  The SARS-CoV-2 RNA is generally detectable in upper and lower respiratory specimens during the acute phase of infection. The lowest concentration of SARS-CoV-2 viral copies this assay can detect is 250 copies / mL. A negative result does not preclude SARS-CoV-2 infection and should not be used as the sole basis for treatment or other patient management decisions.  A negative result may occur with improper specimen collection / handling, submission of specimen other than nasopharyngeal swab, presence of viral mutation(s) within the areas targeted by this assay, and inadequate number of viral copies (<250 copies / mL). A negative result must be combined with clinical observations, patient history, and epidemiological information.  Fact Sheet for Patients:   RoadLapTop.co.za  Fact Sheet for Healthcare Providers: http://kim-miller.com/  This test is not yet approved or  cleared by the [place] FDA and has been authorized for detection and/or diagnosis of SARS-CoV-2 by FDA under an Emergency Use Authorization (EUA).  This EUA will remain in effect (meaning this test can be used) for the duration of the COVID-19 declaration under Section 564(b)(1) of the Act, 21 U.S.C. section 360bbb-3(b)(1), unless the authorization is terminated or revoked sooner.  Performed at [facility], [address] [place], [place] [address]   MRSA Next Gen by PCR, Nasal     Status: None   Collection Time: 04/03/23  8:45 PM   Specimen: Nasal Mucosa; Nasal Swab  Result Value Ref Range Status   MRSA by PCR Next Gen NOT DETECTED NOT DETECTED Final    Comment: (NOTE) The GeneXpert MRSA Assay (FDA approved for NASAL specimens  only), is one component of a comprehensive MRSA colonization surveillance program. It is not intended to diagnose MRSA infection nor to guide or monitor treatment for MRSA infections. Test performance is not FDA approved in patients less than 33 years old. Performed at [facility], [address] [place], [place] [address]      Radiology Studies: ECHOCARDIOGRAM COMPLETE  Result Date: 04/04/2023    ECHOCARDIOGRAM REPORT   Patient Name:   LILY-ROSE DOBERSTEIN Date of Exam: 04/04/2023 Medical Rec #:  [id number]       Height:       64.0 in Accession #:    [id number]      Weight:       123.0 lb Date of Birth:  Sep 23, 1955       BSA:          1.591 m Patient Age:    67 years        BP:           120/90 mmHg Patient Gender: F               HR:           85 bpm. [facility] Procedure: 2D Echo, Cardiac Doppler and Color Doppler Indications:    CHF-Acute Diastolic I50.31  History:        Patient has prior history of Echocardiogram examinations, most                 recent 07/18/2021. Arrythmias:Atrial Fibrillation; Risk                 Factors:Current Smoker. White coat syndrome without diagnosis of                 hypertension.  Sonographer:    Celesta Gentile RCS Referring Phys: [phone] [place]WO[place] S [place] IMPRESSIONS  1. Left ventricular ejection fraction, by estimation, is 45 to 50%. The left ventricle has mildly decreased function. The left ventricle demonstrates global hypokinesis. Left ventricular diastolic parameters are indeterminate.  2. Right ventricular systolic function is normal. The right ventricular size is normal. There is mildly elevated pulmonary artery systolic pressure. The estimated right ventricular systolic pressure is 43.9 mmHg.  3. Left atrial size was moderately dilated.  4. Right atrial size was moderately dilated.  5. A small pericardial effusion is present. The pericardial effusion is posterior to the left ventricle.  6. The mitral valve is grossly normal. Mild mitral valve  regurgitation.  7. Tricuspid valve regurgitation is moderate.  8. The aortic valve is tricuspid. Aortic valve regurgitation is not visualized.  9. The inferior vena cava is dilated in size with <50% respiratory variability, suggesting right atrial pressure of 15 mmHg. Comparison(s): Prior images reviewed side by side. LVEF mildly reduced at 45-50% in the setting of atrial fibrillation. Mildly elevated estimated RVSP. Mild  mitral and moderate tricuspid regurgitation. FINDINGS  Left Ventricle: Left ventricular ejection fraction, by estimation, is 45 to 50%. The left ventricle has mildly decreased function. The left ventricle demonstrates global hypokinesis. The left ventricular internal cavity size was normal in size. There is  no left ventricular hypertrophy. Left ventricular diastolic function could not be evaluated due to atrial fibrillation. Left ventricular diastolic parameters are indeterminate. Right Ventricle: The right ventricular size is normal. No increase in right ventricular wall thickness. Right ventricular systolic function is normal. There is mildly elevated pulmonary artery systolic pressure. The tricuspid regurgitant velocity is 2.69  m/s, and with an assumed right atrial pressure of 15 mmHg, the estimated right ventricular systolic pressure is 43.9 mmHg. Left Atrium: Left atrial size was moderately dilated. Right Atrium: Right atrial size was moderately dilated. Pericardium: A small pericardial effusion is present. The pericardial effusion is posterior to the left ventricle. Presence of epicardial fat layer. Mitral Valve: The mitral valve is grossly normal. Mild mitral valve regurgitation. Tricuspid Valve: The tricuspid valve is grossly normal. Tricuspid valve regurgitation is moderate. Aortic Valve: The aortic valve is tricuspid. Aortic valve regurgitation is not visualized. Pulmonic Valve: The pulmonic valve was grossly normal. Pulmonic valve regurgitation is trivial. Aorta: The aortic root is  normal in size and structure. Venous: The inferior vena cava is dilated in size with less than 50% respiratory variability, suggesting right atrial pressure of 15 mmHg. IAS/Shunts: No atrial level shunt detected by color flow Doppler.  LEFT VENTRICLE PLAX 2D LVIDd:         4.10 cm LVIDs:         3.00 cm LV PW:         1.00 cm LV IVS:        0.80 cm LVOT diam:     2.00 cm LV SV:         38 LV SV Index:   24 LVOT Area:     3.14 cm  RIGHT VENTRICLE TAPSE (M-mode): 1.3 cm LEFT ATRIUM             Index        RIGHT ATRIUM           Index LA diam:        4.20 cm 2.64 cm/m   RA Area:     23.10 cm LA Vol (A2C):   61.4 ml 38.59 ml/m  RA Volume:   77.30 ml  48.58 ml/m LA Vol (A4C):   74.0 ml 46.51 ml/m LA Biplane Vol: 67.8 ml 42.61 ml/m  AORTIC VALVE LVOT Vmax:   70.00 cm/s LVOT Vmean:  48.700 cm/s LVOT VTI:    0.121 m  AORTA Ao Root diam: 3.00 cm MITRAL VALVE               TRICUSPID VALVE MV Area (PHT): 5.02 cm    TR Peak grad:   28.9 mmHg MV Decel Time: 151 msec    TR Vmax:        269.00 cm/s MV E velocity: 75.40 cm/s                            SHUNTS                            Systemic VTI:  0.12 m  Systemic Diam: 2.00 cm Nona Dell MD Electronically signed by Nona Dell MD Signature Date/Time: 04/04/2023/2:26:47 PM    Final    DG Chest 2 View  Result Date: 04/03/2023 CLINICAL DATA:  Shortness of breath EXAM: CHEST - 2 VIEW COMPARISON:  Chest radiograph 07/18/2021 FINDINGS: The heart is borderline enlarged. The upper mediastinal contours are normal The lungs hyperinflated with flattening of the diaphragms consistent with underlying COPD. There are patchy opacities in the right middle lobe superimposed on chronically coarsened interstitial markings. There may be a small right pleural effusion. There is no pneumothorax The bones are demineralized. There is age-indeterminate compression deformity a midthoracic vertebral body. IMPRESSION: 1. Suspect right middle lobe pneumonia and  possible small right pleural effusion superimposed on underlying COPD. Recommend follow-up radiographs in 6-8 weeks to ensure resolution. 2. Age-indeterminate compression deformity a midthoracic vertebral body. Electronically Signed   By: Lesia Hausen M.D.   On: 04/03/2023 12:15    Scheduled Meds:  apixaban  5 mg Oral BID   Chlorhexidine Gluconate Cloth  6 each Topical Daily   metoprolol tartrate  25 mg Oral BID   cyanocobalamin  500 mcg Oral Daily   Continuous Infusions:  azithromycin Stopped (04/04/23 1642)   cefTRIAXone (ROCEPHIN)  IV Stopped (04/04/23 1449)   diltiazem (CARDIZEM) infusion Stopped (04/04/23 1120)     LOS: 1 day   Critical Care Procedure Note Authorized and Performed by: Maryln Manuel MD  Total Critical Care time:  57 mins Due to a high probability of clinically significant, life threatening deterioration, the patient required my highest level of preparedness to intervene emergently and I personally spent this critical care time directly and personally managing the patient.  This critical care time included obtaining a history; examining the patient, pulse oximetry; ordering and review of studies; arranging urgent treatment with development of a management plan; evaluation of patient's response of treatment; frequent reassessment; and discussions with other providers.  This critical care time was performed to assess and manage the high probability of imminent and life threatening deterioration that could result in multi-organ failure.  It was exclusive of separately billable procedures and treating other patients and teaching time.    Standley Dakins, MD How to contact the [facility] Attending or Consulting provider 7A - 7P or covering provider during after hours 7P -7A, for this patient?  Check the care team in [facility] and look for a) attending/consulting [facility] provider listed and b) the [facility] team listed Log into [email or web address] and use [place]'s universal password to access. If you do  not have the password, please contact the hospital operator. Locate the [facility] provider you are looking for under [facility] and page to a number that you can be directly reached. If you still have difficulty reaching the provider, please page the [facility] (Director on Call) for the Hospitalists listed on amion for assistance.  04/04/2023, 5:24 PM

## 2023-04-04 NOTE — Progress Notes (Signed)
*  PRELIMINARY RESULTS* Echocardiogram 2D Echocardiogram has been performed.  Stacey Drain 04/04/2023, 2:13 PM

## 2023-04-04 NOTE — Consult Note (Addendum)
Cardiology Consultation   Patient ID: CIONA MESS MRN: [id number]; DOB: 02/06/1956  Admit date: 04/03/2023 Date of Consult: 04/04/2023  PCP:  Benita Stabile, MD   [facility] Providers Cardiologist:  Marjo Bicker, MD        Patient Profile:   Brandi Rowe is a 67 y.o. female with a hx of paroxysmal atrial fibrillation (diagnosed in 2022 in the setting of SBO and electrolyte abnormalities at that time with outpatient monitor showing no recurrent atrial fibrillation), diverticulitis (s/p partial colectomy with colostomy) and tobacco use who is being seen 04/04/2023 for the evaluation of CHF and atrial fibrillation at the request of Dr. Randol Kern.  History of Present Illness:   [name] was evaluated by Dr. Jenene Slicker in 09/2022 and while she had not experienced known recurrent atrial fibrillation, she did report a strong family history of this and given no upcoming procedures, she was started on Eliquis 5 mg twice daily for anticoagulation while being continued on Lopressor 12.5 mg twice daily.  She presented to [facility] ED on 04/03/2023 for evaluation of worsening lower extremity edema, shortness of breath, fatigue and palpitations for the past week. While in the ED, she was found to be in atrial fibrillation with RVR with heart rate in the 130's.  Initial labs showed WBC 6.0, Hgb 13.4, platelets 227, Na+ 135, K+ 3.7 and creatinine 0.87. BNP elevated to 777. Initial and repeat Hs Troponin values negative at 6 and 7. CXR showed likely right middle lobe pneumonia and possible small right pleural effusion with follow-up imaging recommended in 6 to 8 weeks. Initial EKG showed atrial fibrillation with RVR, HR 136 with ST depression along the inferior and lateral leads.   She was started on IV Cardizem and has been started on Rocephin and Azithromycin for likely PNA. Did not receive diuretic therapy.   In talking with the patient today, she reports she has experienced  worsening shortness of breath with activity, orthopnea, lower extremity edema and fatigue for the past 3-4 weeks. Says she could feel her pulse at times but it felt different from her prior atrial fibrillation. Has not checked her vitals at home. Says she just recently started Eliquis 3 weeks ago as she was nervous about being on anticoagulation as her mom was previously on Coumadin and did not tolerate well. Reports her brother has known atrial fibrillation as well and is actually scheduled for an ablation next week.    Past Medical History:  Diagnosis Date   Acute febrile illness 01/04/2021   Anxiety    Colovaginal fistula    Colovesical fistula    a. 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   Dehydration 01/04/2021   Dysrhythmia    Elevated MCV 01/04/2021   Gout    Hypertension    Nausea 01/04/2021   PAF (paroxysmal atrial fibrillation) (HCC)    a. 06/2021 in setting of SBO, HypoK/Mg; b. 06/2021 Echo: EF 50-55%, no rwma, GrI DD, Nl RV size/fxn, RVSP 36.84mmHg. Mod dil RA. Triv MR.   Sepsis (HCC) 01/05/2021   Tobacco abuse     Past Surgical History:  Procedure Laterality Date   BLADDER REPAIR N/A 05/17/2021   Procedure: Closure of Cystotomy;  Surgeon: Malen Gauze, MD;  Location: [facility];  Service: Urology;  Laterality: N/A;   CARPAL TUNNEL RELEASE Right 2002   COLONOSCOPY WITH PROPOFOL N/A 09/19/2021   Procedure: COLONOSCOPY WITH PROPOFOL;  Surgeon: Franky Macho, MD;  Location: [facility];  Service:  Gastroenterology;  Laterality: N/A;   COLOSTOMY REVERSAL N/A 09/20/2021   Procedure: Small Bowel Resection;  Surgeon: Franky Macho, MD;  Location: [facility];  Service: General;  Laterality: N/A;   MANDIBLE SURGERY  1985   PARTIAL COLECTOMY N/A 05/17/2021   Procedure: PARTIAL COLECTOMY WITH COLOSTOMY;  Surgeon: Franky Macho, MD;  Location: [facility];  Service: General;  Laterality: N/A;   PELVIC LYMPH NODE DISSECTION N/A 05/17/2021   Procedure: PELVIC DISSECTION;  Surgeon: Lazaro Arms, MD;  Location: [facility];  Service: Gynecology;  Laterality: N/A;     Home Medications:  Prior to Admission medications   Medication Sig Start Date End Date Taking? Authorizing Provider  acetaminophen (TYLENOL) 500 MG tablet Take 500 mg by mouth 2 (two) times daily as needed for moderate pain or headache.   Yes [provider]  apixaban (ELIQUIS) 5 MG TABS tablet Take 1 tablet (5 mg total) by mouth 2 (two) times daily. 09/21/22  Yes Mallipeddi, Vishnu P, MD  calcium carbonate (TUMS - DOSED IN MG ELEMENTAL CALCIUM) 500 MG chewable tablet Chew 1 tablet by mouth daily as needed for indigestion or heartburn.   Yes [provider]  clonazePAM (KLONOPIN) 1 MG tablet Take 1 tablet (1 mg total) by mouth daily as needed for anxiety. 07/27/21  Yes Cathlean Marseilles A, NP  cyanocobalamin (VITAMIN B12) 1000 MCG tablet Take by mouth every other day. 04/30/22  Yes [provider]  diphenhydrAMINE (BENADRYL) 25 MG tablet Take 25 mg by mouth at bedtime as needed for allergies.   Yes [provider]  fluticasone (FLONASE) 50 MCG/ACT nasal spray Place 2 sprays into both nostrils daily as needed for allergies.   Yes [provider]  loperamide (IMODIUM) 2 MG capsule as needed for diarrhea or loose stools.   Yes [provider]  metoprolol tartrate (LOPRESSOR) 25 MG tablet Take 0.5 tablets (12.5 mg total) by mouth 2 (two) times daily. 09/21/22 09/16/23 Yes Mallipeddi, Vishnu P, MD  Polyvinyl Alcohol-Povidone (REFRESH OP) Place 1 drop into both eyes daily as needed (dry eyes).   Yes [provider]    Inpatient Medications: Scheduled Meds:  apixaban  5 mg Oral BID   Chlorhexidine Gluconate Cloth  6 each Topical Daily   metoprolol tartrate  25 mg Oral BID   cyanocobalamin  500 mcg Oral Daily   Continuous Infusions:  azithromycin     cefTRIAXone (ROCEPHIN)  IV     diltiazem (CARDIZEM) infusion 5 mg/hr (04/04/23 0759)   PRN Meds: acetaminophen  **OR** acetaminophen, albuterol, calcium carbonate, clonazePAM, fluticasone  Allergies:    Allergies  Allergen Reactions   Dust Mite Extract Other (See Comments)   Grass Extracts [Gramineae Pollens] Other (See Comments)    Dust, mold, mites Congestion   Grass Pollen(K-O-R-T-Swt Vern) Other (See Comments)   Other Other (See Comments)    3-Hydroxy-3-Methylglutaryl-Coenzyme A Reductase Inhibitor   Statins Other (See Comments)    Stiffness in body    Social History:   Social History   Tobacco Use   Smoking status: Every Day    Current packs/day: 0.50    Average packs/day: 0.5 packs/day for 46.0 years (23.0 ttl pk-yrs)    Types: Cigarettes   Smokeless tobacco: Never  Substance Use Topics   Alcohol use: Yes    Alcohol/week: 7.0 standard drinks of alcohol    Types: 7 Glasses of wine per week     Family History:    Family History  Problem Relation Age of Onset  Congestive Heart Failure Mother    Uterine cancer Mother    Pancreatic cancer Father    Atrial fibrillation Brother    Stroke Maternal Grandmother    Heart disease Maternal Grandmother    Glaucoma Maternal Grandfather      ROS:  Please see the history of present illness.  All other ROS reviewed and negative.     Physical Exam/Data:   Vitals:   04/04/23 0700 04/04/23 0759 04/04/23 0800 04/04/23 0847  BP: (!) 132/92  (!) 131/98   Pulse:  93 81   Resp:  20 19   Temp:      TempSrc:      SpO2:  96% 94%   Weight:    55.8 kg  Height:        Intake/Output Summary (Last 24 hours) at 04/04/2023 0934 Last data filed at 04/04/2023 0759 Gross per 24 hour  Intake 90.39 ml  Output --  Net 90.39 ml      04/04/2023    8:47 AM 04/03/2023    8:46 PM 04/03/2023   11:58 AM  Last 3 Weights  Weight (lbs) 123 lb 123 lb 10.9 oz 115 lb  Weight (kg) 55.792 kg 56.1 kg 52.164 kg     Body mass index is 21.11 kg/m.  General:  Well nourished, well developed female appearing in no acute distress. HEENT: normal Neck: no  JVD Vascular: No carotid bruits; Distal pulses 2+ bilaterally Cardiac:  normal S1, S2; Irregularly irregular Lungs: Rales and rhonchi along right base.  Abd: soft, nontender, no hepatomegaly  Ext: 1+ pitting edema up to mid-shins bilaterally.  Musculoskeletal:  No deformities, BUE and BLE strength normal and equal Skin: warm and dry  Neuro:  CNs 2-12 intact, no focal abnormalities noted Psych:  Normal affect   EKG:  The EKG was personally reviewed and demonstrates: Atrial fibrillation with RVR, HR 136 with ST depression along the inferior and lateral leads Telemetry:  Telemetry was personally reviewed and demonstrates: Atrial fibrillation, HR in 70's to 80's. Peaking into the low-100's.   Relevant CV Studies:  Echocardiogram: 06/2021 IMPRESSIONS    1. Left ventricular ejection fraction, by estimation, is 50 to 55%. Left  ventricular ejection fraction by 2D MOD biplane is 53.5 %. The left  ventricle has low normal function. The left ventricle has no regional wall  motion abnormalities. Left  ventricular diastolic parameters are consistent with Grade I diastolic  dysfunction (impaired relaxation).   2. Right ventricular systolic function is normal. The right ventricular  size is normal. There is mildly elevated pulmonary artery systolic  pressure. The estimated right ventricular systolic pressure is 36.7 mmHg.   3. Right atrial size was moderately dilated.   4. The mitral valve is grossly normal. Trivial mitral valve  regurgitation.   5. The aortic valve is tricuspid. Aortic valve regurgitation is not  visualized.   6. The inferior vena cava is normal in size with <50% respiratory  variability, suggesting right atrial pressure of 8 mmHg.   7. Rhythm strip during this exam demostrated normal sinus rhythm.   Comparison(s): No prior Echocardiogram.   Laboratory Data:  High Sensitivity Troponin:   Recent Labs  Lab 04/03/23 1232 04/03/23 1413  TROPONINIHS 6 7      Chemistry Recent Labs  Lab 04/03/23 1232 04/04/23 0459  NA 135 134*  K 3.7 3.8  CL 99 98  CO2 25 26  GLUCOSE 103* 107*  BUN 21 18  CREATININE 0.87 0.86  CALCIUM 8.9 8.9  GFRNONAA >60 >60  ANIONGAP 11 10     Hematology Recent Labs  Lab 04/03/23 1232 04/04/23 0459  WBC 6.0 5.1  RBC 4.05 3.50*  HGB 13.4 11.6*  HCT 41.0 35.4*  MCV 101.2* 101.1*  MCH 33.1 33.1  MCHC 32.7 32.8  RDW 14.6 14.4  PLT 227 182    BNP Recent Labs  Lab 04/03/23 1232  BNP 777.0*     Radiology/Studies:  DG Chest 2 View  Result Date: 04/03/2023 CLINICAL DATA:  Shortness of breath EXAM: CHEST - 2 VIEW COMPARISON:  Chest radiograph 07/18/2021 FINDINGS: The heart is borderline enlarged. The upper mediastinal contours are normal The lungs hyperinflated with flattening of the diaphragms consistent with underlying COPD. There are patchy opacities in the right middle lobe superimposed on chronically coarsened interstitial markings. There may be a small right pleural effusion. There is no pneumothorax The bones are demineralized. There is age-indeterminate compression deformity a midthoracic vertebral body. IMPRESSION: 1. Suspect right middle lobe pneumonia and possible small right pleural effusion superimposed on underlying COPD. Recommend follow-up radiographs in 6-8 weeks to ensure resolution. 2. Age-indeterminate compression deformity a midthoracic vertebral body. Electronically Signed   By: Lesia Hausen M.D.   On: 04/03/2023 12:15     Assessment and Plan:   1. Atrial Fibrillation with RVR - She has a history of this in 2022 but no documented recurrence until this admission. While she is admitted for PNA, she reports having palpitations intermittently for 3-4 weeks prior to this.  - Rates have improved by review of telemetry. She is currently on IV Cardizem 5mg /hr. Was on Lopressor 12.5mg  BID at home and this has been titrated to 25mg  BID. Reviewed with her nurse and will see if IV Cardizem can be  weaned after she receives her AM medications. Would not aggressively titrate Lopressor as her baseline HR is in the 60's when in NSR.  - She recently started Eliquis 3 weeks ago but informed Dr. Diona Browner that she has only been taking this once daily. She has been started on BID dosing. Hopefully a DCCV will not be needed this admission but if so, she would require a TEE.   2. Acute HF Exacerbation (Echo pending to assess EF) - BNP elevated to 777 on admission and echocardiogram pending. She is at risk for a tachycardia-mediated cardiomyopathy as she has possibly been in atrial fibrillation with RVR for 3-4 weeks.  - Given her volume on examination, will dose IV Lasix 20mg  x1 and assess response. Will diurese gradually as SBP was in the low-100's at times overnight.   3. PNA - Remains on antibiotic therapy. Management per the admitting team.   Risk Assessment/Risk Scores:  {  CHA2DS2-VASc Score = 3   This indicates a 3.2% annual risk of stroke. The patient's score is based upon: CHF History: 0 HTN History: 1 Diabetes History: 0 Stroke History: 0 Vascular Disease History: 0 Age Score: 1 Gender Score: 1   For questions or updates, please contact [facility] Please consult [email or web address] for contact info under    Signed, Ellsworth Lennox, PA-C  04/04/2023 9:34 AM   Attending note:  Patient seen and examined.  I reviewed her records and discussed the case with Ms. Patrick Jupiter, agree with her above findings.  Ms. Martucci is currently admitted for evaluation of shortness of breath and fatigue associated with vague sense of palpitations at least over the last week or two.  She has been diagnosed with right middle lobe community-acquired  pneumonia with associated small pleural effusion, BNP elevated at 777 and no clear evidence of ACS by cardiac enzymes.  She is also noted to be in atrial fibrillation with known history of paroxysmal atrial fibrillation and CHA2DS2-VASc score  of 3.  As an outpatient she is on low-dose Lopressor along with Eliquis which she just recently started 3 weeks ago at once daily dosing.  On examination this morning she reports no chest pain or palpitations.  She is afebrile, heart rate is in the 80s to 90s in atrial fibrillation on intravenous diltiazem 5 mg/h.  Blood pressure 120/90.  Lungs exhibit scattered rhonchi with egophony in the mid right lung zone.  Cardiac exam with irregular irregular rhythm.  Pertinent lab work includes potassium 3.8, creatinine 0.86, hemoglobin 11.6, platelets 182.  Viral respiratory panel negative.  Chest x-ray reports right middle lobe infiltrate and small right pleural effusion superimposed on COPD changes.  ECG from 04/03/2023 shows rate controlled atrial fibrillation with nonspecific ST changes.  Plan at this time is to increase Lopressor to 25 mg twice daily and wean off IV diltiazem.  Can uptitrate beta-blocker further if necessary.  Continue Eliquis at 5 mg twice daily and follow-up echocardiogram which has already been ordered.  No clear indication for cardioversion now, would complete treatment for community-acquired pneumonia first.  We will give a dose of IV Lasix today and assess diuretic response.  Jonelle Sidle, M.D., F.A.C.C.

## 2023-04-04 NOTE — Progress Notes (Addendum)
   04/04/23 0847  TOC Brief Assessment  Insurance and Status Reviewed  Patient has primary care physician Yes  Home environment has been reviewed from home  Prior level of function: independent  Prior/Current Home Services No current home services  Social Determinants of Health Reivew SDOH reviewed no interventions necessary  Readmission risk has been reviewed Yes  Transition of care needs no transition of care needs at this time    CHF educational information added to AVS.  Transition of Care Department (TOC) has reviewed patient and no other TOC needs have been identified at this time. We will continue to monitor patient advancement through interdisciplinary progression rounds. If new patient transition needs arise, please place a TOC consult.

## 2023-04-04 NOTE — Evaluation (Signed)
Physical Therapy Evaluation Patient Details Name: Brandi Rowe MRN: [id number] DOB: 02-22-1956 Today's Date: 04/04/2023  History of Present Illness  Brandi Rowe  is a 67 y.o. 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  -Patient presents today secondary to dyspnea, lower extremity edema, palpitation, presents to ED secondary to palpitation, lightheadedness, dyspnea with exertion, as well orthopnea, progressive over the last few weeks, reports she is compliant with her metoprolol and Eliquis, no abdominal pain, no nausea, no vomiting, does endorse some intermittent chest pain, in the past but nothing currently.   Clinical Impression  Patient functioning near baseline for functional mobility and gait other than limited for ambulation endurance due to c/o SOB, otherwise demonstrates good return for ambulating in room/hallway without loss of balance.  Plan:  Patient discharged from physical therapy to care of nursing for ambulation daily as tolerated for length of stay.          If plan is discharge home, recommend the following: Help with stairs or ramp for entrance   Can travel by private vehicle        Equipment Recommendations None recommended by PT  Recommendations for Other Services       Functional Status Assessment Patient has had a recent decline in their functional status and/or demonstrates limited ability to make significant improvements in function in a reasonable and predictable amount of time     Precautions / Restrictions Precautions Precautions: None Restrictions Weight Bearing Restrictions: No      Mobility  Bed Mobility Overal bed mobility: Independent                   Transfers Overall transfer level: Independent                      Ambulation/Gait Ambulation/Gait assistance: Modified independent (Device/Increase time)   Assistive device: None Gait Pattern/deviations: WFL(Within Functional Limits) Gait velocity: near normal     General Gait Details: grossly WFL with good return for ambulating in room, hallway without loss of balance, limited mostly due to c/o SOB  Stairs            Wheelchair Mobility     Tilt Bed    Modified Rankin (Stroke Patients Only)       Balance Overall balance assessment: Independent                                           Pertinent Vitals/Pain Pain Assessment Pain Assessment: No/denies pain    Home Living Family/patient expects to be discharged to:: Private residence Living Arrangements: Spouse/significant other Available Help at Discharge: Available 24 hours/day;Family Type of Home: House Home Access: Stairs to enter   [employer] of Steps: 1 Alternate Level Stairs-Number of Steps: Patient states she does not go upstairs Home Layout: Two level;Able to live on main level with bedroom/bathroom;Full bath on main level Home Equipment: Shower seat      Prior Function Prior Level of Function : Independent/Modified Independent;Driving             Mobility Comments: Community ambulator without AD, drives, shops ADLs Comments: Independent     Extremity/Trunk Assessment  Upper Extremity Assessment Upper Extremity Assessment: Overall WFL for tasks assessed    Lower Extremity Assessment Lower Extremity Assessment: Overall WFL for tasks assessed    Cervical / Trunk Assessment Cervical / Trunk Assessment: Normal  Communication   Communication Communication: No apparent difficulties  Cognition Arousal: Alert Behavior During Therapy: WFL for tasks assessed/performed Overall Cognitive Status: Within Functional Limits for tasks assessed                                           General Comments      Exercises     Assessment/Plan    PT Assessment Patient does not need any further PT services  PT Problem List         PT Treatment Interventions      PT Goals (Current goals can be found in the Care Plan section)  Acute Rehab PT Goals Patient Stated Goal: return home with family to assist PT Goal Formulation: With patient Time For Goal Achievement: 04/04/23 Potential to Achieve Goals: Good    Frequency       Co-evaluation               AM-PAC PT "6 Clicks" Mobility  Outcome Measure Help needed turning from your back to your side while in a flat bed without using bedrails?: None Help needed moving from lying on your back to sitting on the side of a flat bed without using bedrails?: None Help needed moving to and from a bed to a chair (including a wheelchair)?: None Help needed standing up from a chair using your arms (e.g., wheelchair or bedside chair)?: None Help needed to walk in hospital room?: None Help needed climbing 3-5 steps with a railing? : None 6 Click Score: 24    End of Session   Activity Tolerance: Patient tolerated treatment well;Patient limited by fatigue Patient left: in chair;with call bell/phone within reach Nurse Communication: Mobility status PT Visit Diagnosis: Unsteadiness on feet (R26.81);Other abnormalities of gait and mobility (R26.89);Muscle weakness (generalized) (M62.81)    Time: 1914-7829 PT Time Calculation (min) (ACUTE ONLY): 16 min   Charges:   PT Evaluation $PT Eval Low Complexity: 1 Low PT Treatments $Therapeutic Activity: 8-22 mins PT General Charges $$ ACUTE PT VISIT: 1 Visit         2:13 PM, 04/04/23 Ocie Bob, MPT Physical Therapist with [facility] 336 [phone] office [phone] mobile phone

## 2023-04-04 NOTE — Plan of Care (Signed)
  Problem: Activity: Goal: Ability to tolerate increased activity will improve Outcome: Progressing   Problem: Clinical Measurements: Goal: Ability to maintain a body temperature in the normal range will improve Outcome: Progressing   Problem: Respiratory: Goal: Ability to maintain adequate ventilation will improve Outcome: Progressing Goal: Ability to maintain a clear airway will improve Outcome: Progressing   Problem: Education: Goal: Knowledge of General Education information will improve Description: Including pain rating scale, medication(s)/side effects and non-pharmacologic comfort measures Outcome: Progressing   Problem: Health Behavior/Discharge Planning: Goal: Ability to manage health-related needs will improve Outcome: Progressing   

## 2023-04-05 ENCOUNTER — Encounter (HOSPITAL_COMMUNITY): Payer: Self-pay | Admitting: Internal Medicine

## 2023-04-05 DIAGNOSIS — J189 Pneumonia, unspecified organism: Secondary | ICD-10-CM | POA: Diagnosis not present

## 2023-04-05 DIAGNOSIS — I4891 Unspecified atrial fibrillation: Secondary | ICD-10-CM | POA: Diagnosis not present

## 2023-04-05 DIAGNOSIS — I4819 Other persistent atrial fibrillation: Secondary | ICD-10-CM | POA: Diagnosis not present

## 2023-04-05 LAB — CBC
HCT: 37.7 % (ref 36.0–46.0)
Hemoglobin: 12.3 g/dL (ref 12.0–15.0)
MCH: 33.3 pg (ref 26.0–34.0)
MCHC: 32.6 g/dL (ref 30.0–36.0)
MCV: 102.2 fL — ABNORMAL HIGH (ref 80.0–100.0)
Platelets: 209 10*3/uL (ref 150–400)
RBC: 3.69 MIL/uL — ABNORMAL LOW (ref 3.87–5.11)
RDW: 14.4 % (ref 11.5–15.5)
WBC: 6.2 10*3/uL (ref 4.0–10.5)
nRBC: 0 % (ref 0.0–0.2)

## 2023-04-05 LAB — LIPID PANEL
Cholesterol: 115 mg/dL (ref 0–200)
HDL: 46 mg/dL (ref >40)
LDL Cholesterol: 56 mg/dL (ref 0–99)
Total CHOL/HDL Ratio: 2.5 ratio
Triglycerides: 65 mg/dL (ref <150)
VLDL: 13 mg/dL (ref 0–40)

## 2023-04-05 LAB — BASIC METABOLIC PANEL
Anion gap: 12 (ref 5–15)
BUN: 19 mg/dL (ref 8–23)
CO2: 24 mmol/L (ref 22–32)
Calcium: 8.9 mg/dL (ref 8.9–10.3)
Chloride: 100 mmol/L (ref 98–111)
Creatinine, Ser: 1.04 mg/dL — ABNORMAL HIGH (ref 0.44–1.00)
GFR, Estimated: 59 mL/min — ABNORMAL LOW (ref >60)
Glucose, Bld: 107 mg/dL — ABNORMAL HIGH (ref 70–99)
Potassium: 3.6 mmol/L (ref 3.5–5.1)
Sodium: 136 mmol/L (ref 135–145)

## 2023-04-05 LAB — BRAIN NATRIURETIC PEPTIDE: B Natriuretic Peptide: 565 pg/mL — ABNORMAL HIGH (ref 0.0–100.0)

## 2023-04-05 LAB — MAGNESIUM: Magnesium: 1.7 mg/dL (ref 1.7–2.4)

## 2023-04-05 MED ORDER — GUAIFENESIN ER 600 MG PO TB12
600.0000 mg | ORAL_TABLET | Freq: Two times a day (BID) | ORAL | Status: DC
  Administered 2023-04-05 – 2023-04-09 (×7): 600 mg via ORAL
  Filled 2023-04-05 (×7): qty 1

## 2023-04-05 MED ORDER — METOPROLOL TARTRATE 50 MG PO TABS
50.0000 mg | ORAL_TABLET | Freq: Two times a day (BID) | ORAL | Status: DC
  Administered 2023-04-05 (×2): 50 mg via ORAL
  Filled 2023-04-05 (×2): qty 1

## 2023-04-05 MED ORDER — POLYETHYLENE GLYCOL 3350 17 G PO PACK
17.0000 g | PACK | Freq: Every day | ORAL | Status: DC
  Administered 2023-04-05 – 2023-04-06 (×2): 17 g via ORAL
  Filled 2023-04-05 (×2): qty 1

## 2023-04-05 MED ORDER — DEXTROMETHORPHAN POLISTIREX ER 30 MG/5ML PO SUER: 30.0000 mg | Freq: Two times a day (BID) | ORAL | Status: DC | PRN

## 2023-04-05 MED ORDER — MAGNESIUM SULFATE 2 GM/50ML IV SOLN
2.0000 g | Freq: Once | INTRAVENOUS | Status: AC
  Administered 2023-04-05: 2 g via INTRAVENOUS
  Filled 2023-04-05: qty 50

## 2023-04-05 NOTE — Progress Notes (Signed)
Progress Note  Patient Name: Brandi Rowe Date of Encounter: 04/05/2023  Primary Cardiologist: Marjo Bicker, MD  Interval Summary   States that she feels somewhat better today.  Not short of breath at rest.  No chest pain or cough.  Vital Signs    Vitals:   04/05/23 0400 04/05/23 0500 04/05/23 0600 04/05/23 0700  BP: (!) 144/105 (!) 124/92 131/89 (!) 126/92  Pulse: (!) 56 (!) 47 (!) 106   Resp: (!) 28 (!) 26 15   Temp: 97.6 F (36.4 C)     TempSrc: Oral     SpO2: 96% 95% 95% 91%  Weight:  56 kg    Height:        Intake/Output Summary (Last 24 hours) at 04/05/2023 0848 Last data filed at 04/04/2023 1718 Gross per 24 hour  Intake 366.82 ml  Output --  Net 366.82 ml   Filed Weights   04/03/23 2046 04/04/23 0847 04/05/23 0500  Weight: 56.1 kg 55.8 kg 56 kg    Physical Exam   GEN: No acute distress.   Neck: No JVD. Cardiac: Irregularly irregular without gallop.  Respiratory: Nonlabored.  Crackles and rhonchi mainly right base. GI: Soft, nontender, bowel sounds present. MS: Mild ankle edema. Neuro:  Nonfocal. Psych: Alert and oriented x 3. Normal affect.  ECG/Telemetry    Telemetry reviewed showing atrial fibrillation in the 120s.  Labs    Chemistry Recent Labs  Lab 04/03/23 1232 04/04/23 0459 04/05/23 0407  NA 135 134* 136  K 3.7 3.8 3.6  CL 99 98 100  CO2 25 26 24   GLUCOSE 103* 107* 107*  BUN 21 18 19   CREATININE 0.87 0.86 1.04*  CALCIUM 8.9 8.9 8.9  GFRNONAA >60 >60 59*  ANIONGAP 11 10 12     Hematology Recent Labs  Lab 04/03/23 1232 04/04/23 0459 04/05/23 0407  WBC 6.0 5.1 6.2  RBC 4.05 3.50* 3.69*  HGB 13.4 11.6* 12.3  HCT 41.0 35.4* 37.7  MCV 101.2* 101.1* 102.2*  MCH 33.1 33.1 33.3  MCHC 32.7 32.8 32.6  RDW 14.6 14.4 14.4  PLT 227 182 209   Cardiac Enzymes Recent Labs  Lab 04/03/23 1232 04/03/23 1413  TROPONINIHS 6 7   Lipid Panel     Component Value Date/Time   CHOL 115 04/05/2023 0407   TRIG 65 04/05/2023  0407   HDL 46 04/05/2023 0407   CHOLHDL 2.5 04/05/2023 0407   VLDL 13 04/05/2023 0407   LDLCALC 56 04/05/2023 0407   LDLCALC 101 (H) 09/24/2018 0924    Cardiac Studies   Echocardiogram 04/04/2023:  1. Left ventricular ejection fraction, by estimation, is 45 to 50%. The  left ventricle has mildly decreased function. The left ventricle  demonstrates global hypokinesis. Left ventricular diastolic parameters are  indeterminate.   2. Right ventricular systolic function is normal. The right ventricular  size is normal. There is mildly elevated pulmonary artery systolic  pressure. The estimated right ventricular systolic pressure is 43.9 mmHg.   3. Left atrial size was moderately dilated.   4. Right atrial size was moderately dilated.   5. A small pericardial effusion is present. The pericardial effusion is  posterior to the left ventricle.   6. The mitral valve is grossly normal. Mild mitral valve regurgitation.   7. Tricuspid valve regurgitation is moderate.   8. The aortic valve is tricuspid. Aortic valve regurgitation is not  visualized.   9. The inferior vena cava is dilated in size with <50% respiratory  variability, suggesting right atrial pressure of 15 mmHg.   Assessment & Plan   1.  Persistent atrial fibrillation presenting with RVR, duration uncertain but potentially over the last few weeks.  CHA2DS2-VASc score is 3 and she is currently on Eliquis along with Lopressor.  Heart rate control not adequate since discontinuation of IV diltiazem.  2.  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  3.  Community-acquired pneumonia, management per primary team.  Continue Eliquis, increase Lopressor to 50 mg twice daily.  Would focus mostly on heart rate control of atrial fibrillation now, if cannot be obtained with up titration of beta-blocker, would add low-dose amiodarone load 200 mg twice daily next in  anticipation ultimately of an outpatient cardioversion.  Additional GDMT for cardiomyopathy can be considered, although heart rate control should be the main focus for now particularly as she gets over her pneumonia.  For questions or updates, please contact [facility] Please consult [email or web address] for contact info under   Signed, Nona Dell, MD  04/05/2023, 8:48 AM

## 2023-04-05 NOTE — Care Management Important Message (Signed)
Important Message  Patient Details  Name: Brandi Rowe MRN: [id number] Date of Birth: 10/30/1955   Medicare Important Message Given:  N/A - LOS <3 / Initial given by admissions     Corey Harold 04/05/2023, 12:32 PM

## 2023-04-05 NOTE — Plan of Care (Signed)
Problem: Activity: Goal: Ability to tolerate increased activity will improve Outcome: Progressing   Problem: Clinical Measurements: Goal: Ability to maintain a body temperature in the normal range will improve Outcome: Progressing   Problem: Respiratory: Goal: Ability to maintain adequate ventilation will improve Outcome: Progressing Goal: Ability to maintain a clear airway will improve Outcome: Progressing   Problem: Education: Goal: Knowledge of General Education information will improve Description Including pain rating scale, medication(s)/side effects and non-pharmacologic comfort measures Outcome: Progressing

## 2023-04-05 NOTE — Progress Notes (Signed)
   04/05/23 0822  ReDS Vest / Clip  Station Marker A  Ruler Value 30  ReDS Value Range 36 - 40  ReDS Actual Value 38  Anatomical Comments sitting up in bed

## 2023-04-05 NOTE — Progress Notes (Signed)
copies (<250 copies / mL). A negative result must be combined with clinical observations, patient history, and epidemiological information.  Fact Sheet for Patients:   RoadLapTop.co.za  Fact Sheet for Healthcare Providers: http://kim-miller.com/  This test is not yet approved or  cleared by the [place] FDA and has been authorized for detection and/or diagnosis of SARS-CoV-2 by FDA under an Emergency Use Authorization (EUA).  This EUA will remain in effect (meaning this test can be used) for the duration of the COVID-19 declaration under Section 564(b)(1) of the Act, 21 U.S.C. section 360bbb-3(b)(1), unless the authorization is terminated or revoked sooner.  Performed at [facility], [address], [place], [place] [address]   MRSA Next Gen by PCR, Nasal     Status: None   Collection Time: 04/03/23  8:45 PM   Specimen: Nasal Mucosa; Nasal Swab  Result Value Ref Range Status   MRSA by PCR Next Gen NOT DETECTED NOT DETECTED Final    Comment: (NOTE) The GeneXpert MRSA Assay (FDA approved for NASAL specimens only), is one component of a comprehensive MRSA colonization surveillance program. It is not intended to diagnose MRSA infection nor to guide or monitor treatment for MRSA infections. Test performance is not FDA approved in patients less than 70 years old. Performed at [facility], [address] [place], [place] [address]      Radiology Studies: ECHOCARDIOGRAM COMPLETE  Result Date: 04/04/2023    ECHOCARDIOGRAM REPORT   Patient Name:   Brandi Rowe Date of Exam: 04/04/2023 Medical Rec #:  [id number]       Height:       64.0 in Accession #:    [id number]      Weight:       123.0 lb Date of Birth:  1956-05-04       BSA:          1.591 m Patient Age:    67 years        BP:           120/90 mmHg Patient Gender: F               HR:           85 bpm. [facility] Procedure: 2D Echo, Cardiac Doppler and Color Doppler Indications:    CHF-Acute Diastolic I50.31  History:        Patient has prior history of Echocardiogram examinations, most                 recent 07/18/2021. Arrythmias:Atrial Fibrillation; Risk                 Factors:Current Smoker. White coat syndrome without diagnosis of                 hypertension.  Sonographer:    Celesta Gentile RCS Referring Phys: [phone] DAWOOD S ELGER[place] IMPRESSIONS  1. Left ventricular ejection fraction, by estimation, is 45 to 50%. The left ventricle has mildly decreased function. The left ventricle demonstrates global hypokinesis. Left ventricular diastolic parameters are indeterminate.  2. Right ventricular systolic function is normal. The right ventricular size is normal. There is mildly elevated pulmonary artery systolic  pressure. The estimated right ventricular systolic pressure is 43.9 mmHg.  3. Left atrial size was moderately dilated.  4. Right atrial size was moderately dilated.  5. A small pericardial effusion is present. The pericardial effusion is posterior to the left ventricle.  6. The mitral valve is grossly normal.  PROGRESS NOTE   Brandi Rowe  AOZ:[id number] DOB: [birth year] DOA: 04/03/2023 PCP: Benita Stabile, MD   Chief Complaint  Patient presents with   Shortness of Breath   Leg Swelling   Palpitations   Chest Pain   Level of care: Telemetry  Brief Admission History:  67 y.o. 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 -Patient presents today secondary to dyspnea, lower extremity edema, palpitation, presents to ED secondary to palpitation, lightheadedness, dyspnea with exertion, as well orthopnea, progressive over the last few weeks, reports she is compliant with her metoprolol and Eliquis, no abdominal pain, no nausea, no vomiting, does endorse some intermittent chest pain, in the past but nothing currently. -In ED she was noted to be in A-fib with RVR heart rate in the 130s for which she was started on Cardizem drip, her chest x-ray significant for mid right lung opacity concerning for pneumonia, small pleural effusion, she had +3 edema, with elevated BNP 777, [facility] hospitalist consulted to admit for pneumonia and CHF.   Assessment and Plan:  Paroxysmal A-fib with RVR -Patient with known history of paroxysmal A-fib, she is on home metoprolol, and apixaban for anticoagulation-heart rate uncontrolled in the setting of acute illness - metoprolol 25 mg BID started by cardiology , increased to 50 mg BID 9/13 for better HR control  - continue apixaban for full anticoagulation   Community Acquired Pneumonia -continue IV Rocephin, azithromycin, follow on sputum culture, Legionella and strep pneumonia antigen. -Will need repeat imaging in 6-8 weeks to ensure resolution of pneumonia after appropriate antibiotic treatment -continue  mucolytics, cough suppressant    Acute HFrEF  -2D echo in 2022 with a preserved EF, but grade 1 diastolic dysfunction, patient appears to be with massive volume overload, +3 edema lower extremity edema, and BNP elevated at 777, this is most likely in setting of acute infection and A-fib with RVR -I will hold on diuresis given acute infection, soft blood pressure -2D echo: LVEF 45-50% mildly decreased LV function -appreciate cardiology recs, given IV furosemide 20 mg x 1 dose  -Continue daily weight and strict ins and out -redsVest reading 28   Echocardiogram 04/04/23: Left ventricular ejection fraction, by estimation, is 45 to 50%. The left ventricle has mildly decreased function. The left ventricle demonstrates global hypokinesis. Left ventricular diastolic parameters are indeterminate.   2. Right ventricular systolic function is normal. The right ventricular size is normal. There is mildly elevated pulmonary artery systolic pressure. The estimated right ventricular systolic pressure is 43.9 mmHg.   3. Left atrial size was moderately dilated.   4. Right atrial size was moderately dilated.   5. A small pericardial effusion is present. The pericardial effusion is posterior to the left ventricle.   6. The mitral valve is grossly normal. Mild mitral valve regurgitation.   7. Tricuspid valve regurgitation is moderate.   8. The aortic valve is tricuspid. Aortic valve regurgitation is not visualized.   9. The inferior vena cava is dilated in size with <50% respiratory variability, suggesting right atrial pressure of 15 mmHg.   Comparison(s): Prior images reviewed side by side. LVEF mildly reduced at 45-50% in the setting of atrial fibrillation. Mildly elevated estimated RVSP. Mild mitral and moderate tricuspid regurgitation.    history tobacco use disorder - patient  copies (<250 copies / mL). A negative result must be combined with clinical observations, patient history, and epidemiological information.  Fact Sheet for Patients:   RoadLapTop.co.za  Fact Sheet for Healthcare Providers: http://kim-miller.com/  This test is not yet approved or  cleared by the [place] FDA and has been authorized for detection and/or diagnosis of SARS-CoV-2 by FDA under an Emergency Use Authorization (EUA).  This EUA will remain in effect (meaning this test can be used) for the duration of the COVID-19 declaration under Section 564(b)(1) of the Act, 21 U.S.C. section 360bbb-3(b)(1), unless the authorization is terminated or revoked sooner.  Performed at [facility], [address], [place], [place] [address]   MRSA Next Gen by PCR, Nasal     Status: None   Collection Time: 04/03/23  8:45 PM   Specimen: Nasal Mucosa; Nasal Swab  Result Value Ref Range Status   MRSA by PCR Next Gen NOT DETECTED NOT DETECTED Final    Comment: (NOTE) The GeneXpert MRSA Assay (FDA approved for NASAL specimens only), is one component of a comprehensive MRSA colonization surveillance program. It is not intended to diagnose MRSA infection nor to guide or monitor treatment for MRSA infections. Test performance is not FDA approved in patients less than 70 years old. Performed at [facility], [address] [place], [place] [address]      Radiology Studies: ECHOCARDIOGRAM COMPLETE  Result Date: 04/04/2023    ECHOCARDIOGRAM REPORT   Patient Name:   Brandi Rowe Date of Exam: 04/04/2023 Medical Rec #:  [id number]       Height:       64.0 in Accession #:    [id number]      Weight:       123.0 lb Date of Birth:  1956-05-04       BSA:          1.591 m Patient Age:    67 years        BP:           120/90 mmHg Patient Gender: F               HR:           85 bpm. [facility] Procedure: 2D Echo, Cardiac Doppler and Color Doppler Indications:    CHF-Acute Diastolic I50.31  History:        Patient has prior history of Echocardiogram examinations, most                 recent 07/18/2021. Arrythmias:Atrial Fibrillation; Risk                 Factors:Current Smoker. White coat syndrome without diagnosis of                 hypertension.  Sonographer:    Celesta Gentile RCS Referring Phys: [phone] DAWOOD S [place]GER[place] IMPRESSIONS  1. Left ventricular ejection fraction, by estimation, is 45 to 50%. The left ventricle has mildly decreased function. The left ventricle demonstrates global hypokinesis. Left ventricular diastolic parameters are indeterminate.  2. Right ventricular systolic function is normal. The right ventricular size is normal. There is mildly elevated pulmonary artery systolic  pressure. The estimated right ventricular systolic pressure is 43.9 mmHg.  3. Left atrial size was moderately dilated.  4. Right atrial size was moderately dilated.  5. A small pericardial effusion is present. The pericardial effusion is posterior to the left ventricle.  6. The mitral valve is grossly normal.  copies (<250 copies / mL). A negative result must be combined with clinical observations, patient history, and epidemiological information.  Fact Sheet for Patients:   RoadLapTop.co.za  Fact Sheet for Healthcare Providers: http://kim-miller.com/  This test is not yet approved or  cleared by the [place] FDA and has been authorized for detection and/or diagnosis of SARS-CoV-2 by FDA under an Emergency Use Authorization (EUA).  This EUA will remain in effect (meaning this test can be used) for the duration of the COVID-19 declaration under Section 564(b)(1) of the Act, 21 U.S.C. section 360bbb-3(b)(1), unless the authorization is terminated or revoked sooner.  Performed at [facility], [address], [place], [place] [address]   MRSA Next Gen by PCR, Nasal     Status: None   Collection Time: 04/03/23  8:45 PM   Specimen: Nasal Mucosa; Nasal Swab  Result Value Ref Range Status   MRSA by PCR Next Gen NOT DETECTED NOT DETECTED Final    Comment: (NOTE) The GeneXpert MRSA Assay (FDA approved for NASAL specimens only), is one component of a comprehensive MRSA colonization surveillance program. It is not intended to diagnose MRSA infection nor to guide or monitor treatment for MRSA infections. Test performance is not FDA approved in patients less than 70 years old. Performed at [facility], [address] [place], [place] [address]      Radiology Studies: ECHOCARDIOGRAM COMPLETE  Result Date: 04/04/2023    ECHOCARDIOGRAM REPORT   Patient Name:   Brandi Rowe Date of Exam: 04/04/2023 Medical Rec #:  [id number]       Height:       64.0 in Accession #:    [id number]      Weight:       123.0 lb Date of Birth:  1956-05-04       BSA:          1.591 m Patient Age:    67 years        BP:           120/90 mmHg Patient Gender: F               HR:           85 bpm. [facility] Procedure: 2D Echo, Cardiac Doppler and Color Doppler Indications:    CHF-Acute Diastolic I50.31  History:        Patient has prior history of Echocardiogram examinations, most                 recent 07/18/2021. Arrythmias:Atrial Fibrillation; Risk                 Factors:Current Smoker. White coat syndrome without diagnosis of                 hypertension.  Sonographer:    Celesta Gentile RCS Referring Phys: [phone] DAWOOD S ELGER[place] IMPRESSIONS  1. Left ventricular ejection fraction, by estimation, is 45 to 50%. The left ventricle has mildly decreased function. The left ventricle demonstrates global hypokinesis. Left ventricular diastolic parameters are indeterminate.  2. Right ventricular systolic function is normal. The right ventricular size is normal. There is mildly elevated pulmonary artery systolic  pressure. The estimated right ventricular systolic pressure is 43.9 mmHg.  3. Left atrial size was moderately dilated.  4. Right atrial size was moderately dilated.  5. A small pericardial effusion is present. The pericardial effusion is posterior to the left ventricle.  6. The mitral valve is grossly normal.  copies (<250 copies / mL). A negative result must be combined with clinical observations, patient history, and epidemiological information.  Fact Sheet for Patients:   RoadLapTop.co.za  Fact Sheet for Healthcare Providers: http://kim-miller.com/  This test is not yet approved or  cleared by the [place] FDA and has been authorized for detection and/or diagnosis of SARS-CoV-2 by FDA under an Emergency Use Authorization (EUA).  This EUA will remain in effect (meaning this test can be used) for the duration of the COVID-19 declaration under Section 564(b)(1) of the Act, 21 U.S.C. section 360bbb-3(b)(1), unless the authorization is terminated or revoked sooner.  Performed at [facility], [address], [place], [place] [address]   MRSA Next Gen by PCR, Nasal     Status: None   Collection Time: 04/03/23  8:45 PM   Specimen: Nasal Mucosa; Nasal Swab  Result Value Ref Range Status   MRSA by PCR Next Gen NOT DETECTED NOT DETECTED Final    Comment: (NOTE) The GeneXpert MRSA Assay (FDA approved for NASAL specimens only), is one component of a comprehensive MRSA colonization surveillance program. It is not intended to diagnose MRSA infection nor to guide or monitor treatment for MRSA infections. Test performance is not FDA approved in patients less than 70 years old. Performed at [facility], [address] [place], [place] [address]      Radiology Studies: ECHOCARDIOGRAM COMPLETE  Result Date: 04/04/2023    ECHOCARDIOGRAM REPORT   Patient Name:   Brandi Rowe Date of Exam: 04/04/2023 Medical Rec #:  [id number]       Height:       64.0 in Accession #:    [id number]      Weight:       123.0 lb Date of Birth:  1956-05-04       BSA:          1.591 m Patient Age:    67 years        BP:           120/90 mmHg Patient Gender: F               HR:           85 bpm. [facility] Procedure: 2D Echo, Cardiac Doppler and Color Doppler Indications:    CHF-Acute Diastolic I50.31  History:        Patient has prior history of Echocardiogram examinations, most                 recent 07/18/2021. Arrythmias:Atrial Fibrillation; Risk                 Factors:Current Smoker. White coat syndrome without diagnosis of                 hypertension.  Sonographer:    Celesta Gentile RCS Referring Phys: [phone] DAWOOD S ELGER[place]WY IMPRESSIONS  1. Left ventricular ejection fraction, by estimation, is 45 to 50%. The left ventricle has mildly decreased function. The left ventricle demonstrates global hypokinesis. Left ventricular diastolic parameters are indeterminate.  2. Right ventricular systolic function is normal. The right ventricular size is normal. There is mildly elevated pulmonary artery systolic  pressure. The estimated right ventricular systolic pressure is 43.9 mmHg.  3. Left atrial size was moderately dilated.  4. Right atrial size was moderately dilated.  5. A small pericardial effusion is present. The pericardial effusion is posterior to the left ventricle.  6. The mitral valve is grossly normal.

## 2023-04-06 DIAGNOSIS — J189 Pneumonia, unspecified organism: Secondary | ICD-10-CM | POA: Diagnosis not present

## 2023-04-06 DIAGNOSIS — I4891 Unspecified atrial fibrillation: Secondary | ICD-10-CM | POA: Diagnosis not present

## 2023-04-06 MED ORDER — METOPROLOL TARTRATE 50 MG PO TABS
75.0000 mg | ORAL_TABLET | Freq: Two times a day (BID) | ORAL | Status: DC
  Administered 2023-04-06 – 2023-04-08 (×6): 75 mg via ORAL
  Filled 2023-04-06 (×6): qty 1

## 2023-04-06 NOTE — Progress Notes (Signed)
PROGRESS NOTE   ROHNDA Rowe  WUJ:[id number] DOB: 1956/06/08 DOA: 04/03/2023 PCP: Benita Stabile, MD   Chief Complaint  Patient presents with   Shortness of Breath   Leg Swelling   Palpitations   Chest Pain   Level of care: Telemetry  Brief Admission History:  67 y.o. 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 -Patient presents today secondary to dyspnea, lower extremity edema, palpitation, presents to ED secondary to palpitation, lightheadedness, dyspnea with exertion, as well orthopnea, progressive over the last few weeks, reports she is compliant with her metoprolol and Eliquis, no abdominal pain, no nausea, no vomiting, does endorse some intermittent chest pain, in the past but nothing currently. -In ED she was noted to be in A-fib with RVR heart rate in the 130s for which she was started on Cardizem drip, her chest x-ray significant for mid right lung opacity concerning for pneumonia, small pleural effusion, she had +3 edema, with elevated BNP 777, [facility]ist consulted to admit for pneumonia and CHF.   Assessment and Plan:  Paroxysmal A-fib with RVR -Patient with known history of paroxysmal A-fib, she is on home metoprolol, and apixaban for anticoagulation-heart rate uncontrolled in the setting of acute illness - metoprolol 25 mg BID started by cardiology , increased to 50 mg BID 9/13 for better HR control, increased to 75 mg BID on 9/14 - continue apixaban for full anticoagulation - if HR remains suboptimally controlled, will add amiodarone 200 mg BID    Community Acquired Pneumonia -continue IV Rocephin, azithromycin, follow on sputum culture, Legionella and strep pneumonia antigen. -Will need repeat  imaging in 6-8 weeks to ensure resolution of pneumonia after appropriate antibiotic treatment -continue mucolytics, cough suppressant    Acute HFrEF  -2D echo in 2022 with a preserved EF, but grade 1 diastolic dysfunction, patient appears to be with massive volume overload, +3 edema lower extremity edema, and BNP elevated at 777, this is most likely in setting of acute infection and A-fib with RVR -I will hold on diuresis given acute infection, soft blood pressure -2D echo: LVEF 45-50% mildly decreased LV function -appreciate cardiology recs, given IV furosemide 20 mg x 1 dose  -Continue daily weight and strict ins and out -redsVest reading 28 which is reassuring  Echocardiogram 04/04/23: Left ventricular ejection fraction, by estimation, is 45 to 50%. The left ventricle has mildly decreased function. The left ventricle demonstrates global hypokinesis. Left ventricular diastolic parameters are indeterminate.   2. Right ventricular systolic function is normal. The right ventricular size is normal. There is mildly elevated pulmonary artery systolic pressure. The estimated right ventricular systolic pressure is 43.9 mmHg.   3. Left atrial size was moderately dilated.   4. Right atrial size was moderately dilated.   5. A small pericardial effusion is present. The pericardial effusion is posterior to the left ventricle.   6. The mitral valve is grossly normal. Mild mitral valve regurgitation.   7. Tricuspid valve regurgitation is moderate.   8. The aortic valve is tricuspid. Aortic valve regurgitation is not visualized.   9. The inferior vena cava is dilated in size with <50% respiratory variability, suggesting right atrial pressure of 15 mmHg.   Comparison(s): Prior images reviewed side by side. LVEF mildly reduced at 45-50% in the setting of  atrial fibrillation. Mildly elevated estimated RVSP. Mild mitral and moderate tricuspid regurgitation.    history tobacco use disorder - patient quit smoking  5 weeks ago   Anxiety disorder, unspecified Continue home dose clonazepam  Constipation  - added miralax 17 gram po daily    DVT prophylaxis: apixaban  Code Status: Full  Family Communication:  Disposition: anticipate home with [facility]   Consultants:  cardiology  Procedures:   Antimicrobials:  Ceftriaxone 9/12>> Azithromycin 9/12>>   Subjective: Pt having occasional palpitations, no CP, cough persists but improving   Objective: Vitals:   04/06/23 0500 04/06/23 0747 04/06/23 0800 04/06/23 1159  BP: (!) 123/101  (!) 141/107   Pulse: 99  93   Resp: 19  20   Temp: (!) 97.5 F (36.4 C) (!) 97.3 F (36.3 C)  97.6 F (36.4 C)  TempSrc: Oral Oral  Oral  SpO2: 93%  94%   Weight: 58.3 kg     Height:        Intake/Output Summary (Last 24 hours) at 04/06/2023 1317 Last data filed at 04/06/2023 1250 Gross per 24 hour  Intake 1050.44 ml  Output 100 ml  Net 950.44 ml   Filed Weights   04/04/23 0847 04/05/23 0500 04/06/23 0500  Weight: 55.8 kg 56 kg 58.3 kg   Examination:  General exam: Appears calm and comfortable  Respiratory system: no increased WOB, BBS heard. Respiratory effort normal. Cardiovascular system: irregularly irregular, normal S1 & S2 heard. No JVD, murmurs, rubs, gallops or clicks. No pedal edema. Gastrointestinal system: Abdomen is nondistended, soft and nontender. No organomegaly or masses felt. Normal bowel sounds heard. Central nervous system: Alert and oriented. No focal neurological deficits. Extremities: Symmetric 5 x 5 power. Skin: No rashes, lesions or ulcers. Psychiatry: Judgement and insight appear normal. Mood & affect appropriate.   Data Reviewed: I have personally reviewed following labs and imaging studies  CBC: Recent Labs  Lab 04/03/23 1232 04/04/23 0459 04/05/23 0407  WBC 6.0 5.1 6.2  NEUTROABS 4.0  --   --   HGB 13.4 11.6* 12.3  HCT 41.0 35.4* 37.7  MCV 101.2* 101.1* 102.2*  PLT 227 182 209    Basic Metabolic Panel: Recent  Labs  Lab 04/03/23 1232 04/04/23 0459 04/05/23 0407  NA 135 134* 136  K 3.7 3.8 3.6  CL 99 98 100  CO2 25 26 24   GLUCOSE 103* 107* 107*  BUN 21 18 19   CREATININE 0.87 0.86 1.04*  CALCIUM 8.9 8.9 8.9  MG  --   --  1.7    CBG: No results for input(s): "GLUCAP" in the last 168 hours.  Recent Results (from the past 240 hour(s))  SARS Coronavirus 2 by RT PCR (hospital order, performed in [facility] hospital lab) *cepheid single result test* Anterior Nasal Swab     Status: None   Collection Time: 04/03/23  4:50 PM   Specimen: Anterior Nasal Swab  Result Value Ref Range Status   SARS Coronavirus 2 by RT PCR NEGATIVE NEGATIVE Final    Comment: (NOTE) SARS-CoV-2 target nucleic acids are NOT DETECTED.  The SARS-CoV-2 RNA is generally detectable in upper and lower respiratory specimens during the acute phase of infection. The lowest concentration of SARS-CoV-2 viral copies this assay can detect is 250 copies / mL. A negative result does not preclude SARS-CoV-2 infection and should not be used as the sole basis for treatment or other patient management decisions.  A negative result may occur with improper specimen collection / handling,  submission of specimen other than nasopharyngeal swab, presence of viral mutation(s) within the areas targeted by this assay, and inadequate number of viral copies (<250 copies / mL). A negative result must be combined with clinical observations, patient history, and epidemiological information.  Fact Sheet for Patients:   RoadLapTop.co.za  Fact Sheet for Healthcare Providers: http://kim-miller.com/  This test is not yet approved or  cleared by the [place] FDA and has been authorized for detection and/or diagnosis of SARS-CoV-2 by FDA under an Emergency Use Authorization (EUA).  This EUA will remain in effect (meaning this test can be used) for the duration of the COVID-19 declaration under  Section 564(b)(1) of the Act, 21 U.S.C. section 360bbb-3(b)(1), unless the authorization is terminated or revoked sooner.  Performed at [facility], [address], [place], [place] [address]   MRSA Next Gen by PCR, Nasal     Status: None   Collection Time: 04/03/23  8:45 PM   Specimen: Nasal Mucosa; Nasal Swab  Result Value Ref Range Status   MRSA by PCR Next Gen NOT DETECTED NOT DETECTED Final    Comment: (NOTE) The GeneXpert MRSA Assay (FDA approved for NASAL specimens only), is one component of a comprehensive MRSA colonization surveillance program. It is not intended to diagnose MRSA infection nor to guide or monitor treatment for MRSA infections. Test performance is not FDA approved in patients less than 50 years old. Performed at [facility], [address], [place], [place] [address]      Radiology Studies: ECHOCARDIOGRAM COMPLETE  Result Date: 04/04/2023    ECHOCARDIOGRAM REPORT   Patient Name:   Brandi Rowe Date of Exam: 04/04/2023 Medical Rec #:  [id number]       Height:       64.0 in Accession #:    [id number]      Weight:       123.0 lb Date of Birth:  Jan 30, 1956       BSA:          1.591 m Patient Age:    67 years        BP:           120/90 mmHg Patient Gender: F               HR:           85 bpm. [facility] Procedure: 2D Echo, Cardiac Doppler and Color Doppler Indications:    CHF-Acute Diastolic I50.31  History:        Patient has prior history of Echocardiogram examinations, most                 recent 07/18/2021. Arrythmias:Atrial Fibrillation; Risk                 Factors:Current Smoker. White coat syndrome without diagnosis of                 hypertension.  Sonographer:    Celesta Gentile RCS Referring Phys: [phone] [place]WO[place] S [place] IMPRESSIONS  1. Left ventricular ejection fraction, by estimation, is 45 to 50%. The left ventricle has mildly decreased function. The left ventricle demonstrates global hypokinesis. Left ventricular diastolic parameters are  indeterminate.  2. Right ventricular systolic function is normal. The right ventricular size is normal. There is mildly elevated pulmonary artery systolic pressure. The estimated right ventricular systolic pressure is 43.9 mmHg.  3. Left atrial size was moderately dilated.  4. Right atrial size was moderately dilated.  5.  A small pericardial effusion is present. The pericardial effusion is posterior to the left ventricle.  6. The mitral valve is grossly normal. Mild mitral valve regurgitation.  7. Tricuspid valve regurgitation is moderate.  8. The aortic valve is tricuspid. Aortic valve regurgitation is not visualized.  9. The inferior vena cava is dilated in size with <50% respiratory variability, suggesting right atrial pressure of 15 mmHg. Comparison(s): Prior images reviewed side by side. LVEF mildly reduced at 45-50% in the setting of atrial fibrillation. Mildly elevated estimated RVSP. Mild mitral and moderate tricuspid regurgitation. FINDINGS  Left Ventricle: Left ventricular ejection fraction, by estimation, is 45 to 50%. The left ventricle has mildly decreased function. The left ventricle demonstrates global hypokinesis. The left ventricular internal cavity size was normal in size. There is  no left ventricular hypertrophy. Left ventricular diastolic function could not be evaluated due to atrial fibrillation. Left ventricular diastolic parameters are indeterminate. Right Ventricle: The right ventricular size is normal. No increase in right ventricular wall thickness. Right ventricular systolic function is normal. There is mildly elevated pulmonary artery systolic pressure. The tricuspid regurgitant velocity is 2.69  m/s, and with an assumed right atrial pressure of 15 mmHg, the estimated right ventricular systolic pressure is 43.9 mmHg. Left Atrium: Left atrial size was moderately dilated. Right Atrium: Right atrial size was moderately dilated. Pericardium: A small pericardial effusion is present. The  pericardial effusion is posterior to the left ventricle. Presence of epicardial fat layer. Mitral Valve: The mitral valve is grossly normal. Mild mitral valve regurgitation. Tricuspid Valve: The tricuspid valve is grossly normal. Tricuspid valve regurgitation is moderate. Aortic Valve: The aortic valve is tricuspid. Aortic valve regurgitation is not visualized. Pulmonic Valve: The pulmonic valve was grossly normal. Pulmonic valve regurgitation is trivial. Aorta: The aortic root is normal in size and structure. Venous: The inferior vena cava is dilated in size with less than 50% respiratory variability, suggesting right atrial pressure of 15 mmHg. IAS/Shunts: No atrial level shunt detected by color flow Doppler.  LEFT VENTRICLE PLAX 2D LVIDd:         4.10 cm LVIDs:         3.00 cm LV PW:         1.00 cm LV IVS:        0.80 cm LVOT diam:     2.00 cm LV SV:         38 LV SV Index:   24 LVOT Area:     3.14 cm  RIGHT VENTRICLE TAPSE (M-mode): 1.3 cm LEFT ATRIUM             Index        RIGHT ATRIUM           Index LA diam:        4.20 cm 2.64 cm/m   RA Area:     23.10 cm LA Vol (A2C):   61.4 ml 38.59 ml/m  RA Volume:   77.30 ml  48.58 ml/m LA Vol (A4C):   74.0 ml 46.51 ml/m LA Biplane Vol: 67.8 ml 42.61 ml/m  AORTIC VALVE LVOT Vmax:   70.00 cm/s LVOT Vmean:  48.700 cm/s LVOT VTI:    0.121 m  AORTA Ao Root diam: 3.00 cm MITRAL VALVE               TRICUSPID VALVE MV Area (PHT): 5.02 cm    TR Peak grad:   28.9 mmHg MV Decel Time: 151 msec    TR Vmax:  269.00 cm/s MV E velocity: 75.40 cm/s                            SHUNTS                            Systemic VTI:  0.12 m                            Systemic Diam: 2.00 cm Nona Dell MD Electronically signed by Nona Dell MD Signature Date/Time: 04/04/2023/2:26:47 PM    Final     Scheduled Meds:  apixaban  5 mg Oral BID   Chlorhexidine Gluconate Cloth  6 each Topical Daily   guaiFENesin  600 mg Oral BID   metoprolol tartrate  75 mg Oral BID    polyethylene glycol  17 g Oral Daily   cyanocobalamin  500 mcg Oral Daily   Continuous Infusions:  azithromycin Stopped (04/05/23 1456)   cefTRIAXone (ROCEPHIN)  IV Stopped (04/05/23 1321)     LOS: 3 days   Time spent: 55 mins  Luke Falero Laural Benes, MD How to contact the [facility] Attending or Consulting provider 7A - 7P or covering provider during after hours 7P -7A, for this patient?  Check the care team in [facility] and look for a) attending/consulting [facility] provider listed and b) the [facility] team listed Log into [email or web address] and use 's universal password to access. If you do not have the password, please contact the hospital operator. Locate the [facility] provider you are looking for under [facility] and page to a number that you can be directly reached. If you still have difficulty reaching the provider, please page the [facility] (Director on Call) for the Hospitalists listed on amion for assistance.  04/06/2023, 1:17 PM

## 2023-04-07 DIAGNOSIS — I4819 Other persistent atrial fibrillation: Secondary | ICD-10-CM

## 2023-04-07 LAB — BASIC METABOLIC PANEL
Anion gap: 9 (ref 5–15)
BUN: 17 mg/dL (ref 8–23)
CO2: 27 mmol/L (ref 22–32)
Calcium: 8.9 mg/dL (ref 8.9–10.3)
Chloride: 97 mmol/L — ABNORMAL LOW (ref 98–111)
Creatinine, Ser: 1 mg/dL (ref 0.44–1.00)
GFR, Estimated: >60 mL/min (ref >60)
Glucose, Bld: 123 mg/dL — ABNORMAL HIGH (ref 70–99)
Potassium: 3.8 mmol/L (ref 3.5–5.1)
Sodium: 133 mmol/L — ABNORMAL LOW (ref 135–145)

## 2023-04-07 LAB — MAGNESIUM: Magnesium: 1.9 mg/dL (ref 1.7–2.4)

## 2023-04-07 MED ORDER — AMIODARONE HCL 200 MG PO TABS
200.0000 mg | ORAL_TABLET | Freq: Two times a day (BID) | ORAL | Status: DC
  Administered 2023-04-07 – 2023-04-08 (×3): 200 mg via ORAL
  Filled 2023-04-07 (×3): qty 1

## 2023-04-07 MED ORDER — LOPERAMIDE HCL 2 MG PO CAPS
2.0000 mg | ORAL_CAPSULE | Freq: Once | ORAL | Status: AC
  Administered 2023-04-07: 2 mg via ORAL
  Filled 2023-04-07: qty 1

## 2023-04-07 MED ORDER — MAGNESIUM SULFATE 2 GM/50ML IV SOLN
2.0000 g | Freq: Once | INTRAVENOUS | Status: AC
  Administered 2023-04-07: 2 g via INTRAVENOUS
  Filled 2023-04-07: qty 50

## 2023-04-07 MED ORDER — POLYETHYLENE GLYCOL 3350 17 G PO PACK: 17.0000 g | PACK | Freq: Every day | ORAL | Status: DC | PRN

## 2023-04-07 MED ORDER — LOPERAMIDE HCL 2 MG PO CAPS
2.0000 mg | ORAL_CAPSULE | ORAL | Status: DC | PRN
  Administered 2023-04-07 – 2023-04-08 (×2): 2 mg via ORAL
  Filled 2023-04-07 (×3): qty 1

## 2023-04-07 MED ORDER — SACCHAROMYCES BOULARDII 250 MG PO CAPS
250.0000 mg | ORAL_CAPSULE | Freq: Two times a day (BID) | ORAL | Status: DC
  Administered 2023-04-07 – 2023-04-09 (×4): 250 mg via ORAL
  Filled 2023-04-07 (×4): qty 1

## 2023-04-07 NOTE — Progress Notes (Addendum)
PROGRESS NOTE   Brandi Rowe  ZOX:[id number] DOB: [birth year] DOA: 04/03/2023 PCP: Benita Stabile, MD   Chief Complaint  Patient presents with   Shortness of Breath   Leg Swelling   Palpitations   Chest Pain   Level of care: Telemetry  Brief Admission History:  67 y.o. 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 -Patient presents today secondary to dyspnea, lower extremity edema, palpitation, presents to ED secondary to palpitation, lightheadedness, dyspnea with exertion, as well orthopnea, progressive over the last few weeks, reports she is compliant with her metoprolol and Eliquis, no abdominal pain, no nausea, no vomiting, does endorse some intermittent chest pain, in the past but nothing currently. -In ED she was noted to be in A-fib with RVR heart rate in the 130s for which she was started on Cardizem drip, her chest x-ray significant for mid right lung opacity concerning for pneumonia, small pleural effusion, she had +3 edema, with elevated BNP 777, [facility] hospitalist consulted to admit for pneumonia and CHF.   Assessment and Plan:  Paroxysmal A-fib with RVR -Patient with known history of paroxysmal A-fib, she is on home metoprolol, and apixaban for anticoagulation-heart rate uncontrolled in the setting of acute illness - metoprolol 25 mg BID started by cardiology, increased to 50 mg BID 9/13 for better HR control, increased to 75 mg BID on 9/14 - continue apixaban for full anticoagulation - HR remains suboptimally controlled, added amiodarone 200 mg BID 9/15 - Given additional Mg 2 gm IV on 9/15 to try to get serum Mg>2.    Community Acquired Pneumonia -continue IV Rocephin, azithromycin, follow on sputum culture,  Legionella and strep pneumonia antigen. -Will need repeat imaging in 6-8 weeks to ensure resolution of pneumonia after appropriate antibiotic treatment -continue mucolytics, cough suppressant -2 more days of antibiotics to complete full course    Acute HFrEF  -2D echo in 2022 with a preserved EF, but grade 1 diastolic dysfunction, patient appears to be with massive volume overload, +3 edema lower extremity edema, and BNP elevated at 777, this is most likely in setting of acute infection and A-fib with RVR -I will hold on diuresis given acute infection, soft blood pressure -2D echo: LVEF 45-50% mildly decreased LV function -appreciate cardiology recs, given IV furosemide 20 mg x 1 dose  -Continue daily weight and strict ins and out -redsVest reading 28 which is reassuring  Echocardiogram 04/04/23: Left ventricular ejection fraction, by estimation, is 45 to 50%. The left ventricle has mildly decreased function. The left ventricle demonstrates global hypokinesis. Left ventricular diastolic parameters are indeterminate.   2. Right ventricular systolic function is normal. The right ventricular size is normal. There is mildly elevated pulmonary artery systolic pressure. The estimated right ventricular systolic pressure is 43.9 mmHg.   3. Left atrial size was moderately dilated.   4. Right atrial size was moderately dilated.   5. A small pericardial effusion is present. The pericardial effusion is posterior to the left ventricle.   6. The mitral valve is grossly normal. Mild mitral valve regurgitation.   7. Tricuspid valve regurgitation is moderate.   8. The aortic valve is tricuspid. Aortic valve regurgitation is not visualized.   9. The inferior vena cava is dilated in size with <50% respiratory variability, suggesting right atrial  pressure of 15 mmHg.   Comparison(s): Prior images reviewed side by side. LVEF mildly reduced at 45-50% in the setting of atrial fibrillation. Mildly elevated estimated  RVSP. Mild mitral and moderate tricuspid regurgitation.    history tobacco use disorder - patient quit smoking 5 weeks ago   Anxiety disorder, unspecified Continue home dose clonazepam  Constipation  - added miralax 17 gram po daily    DVT prophylaxis: apixaban  Code Status: Full  Family Communication:  Disposition: anticipate home with [facility]   Consultants:  cardiology  Procedures:   Antimicrobials:  Ceftriaxone 9/12>> Azithromycin 9/12>>   Subjective: Still having bouts of shortness of breath and palpitations  Objective: Vitals:   04/07/23 0800 04/07/23 0813 04/07/23 0840 04/07/23 0905  BP:   (!) 133/109 (!) 125/102  Pulse:   (!) 112 (!) 107  Resp:   19 16  Temp:  97.6 F (36.4 C)  98 F (36.7 C)  TempSrc:  Oral  Oral  SpO2:   95% 97%  Weight: 54.9 kg     Height:        Intake/Output Summary (Last 24 hours) at 04/07/2023 1244 Last data filed at 04/07/2023 0843 Gross per 24 hour  Intake 830 ml  Output --  Net 830 ml   Filed Weights   04/05/23 0500 04/06/23 0500 04/07/23 0800  Weight: 56 kg 58.3 kg 54.9 kg   Examination:  General exam: Appears calm and comfortable  Respiratory system: no increased WOB, BBS heard. Respiratory effort normal. Cardiovascular system: irregularly irregular, normal S1 & S2 heard. No JVD, murmurs, rubs, gallops or clicks. No pedal edema. Gastrointestinal system: Abdomen is nondistended, soft and nontender. No organomegaly or masses felt. Normal bowel sounds heard. Central nervous system: Alert and oriented. No focal neurological deficits. Extremities: Symmetric 5 x 5 power. Skin: No rashes, lesions or ulcers. Psychiatry: Judgement and insight appear normal. Mood & affect appropriate.   Data Reviewed: I have personally reviewed following labs and imaging studies  CBC: Recent Labs  Lab 04/03/23 1232 04/04/23 0459 04/05/23 0407  WBC 6.0 5.1 6.2  NEUTROABS 4.0  --   --   HGB 13.4 11.6* 12.3  HCT 41.0 35.4* 37.7  MCV 101.2*  101.1* 102.2*  PLT 227 182 209    Basic Metabolic Panel: Recent Labs  Lab 04/03/23 1232 04/04/23 0459 04/05/23 0407 04/07/23 0423  NA 135 134* 136 133*  K 3.7 3.8 3.6 3.8  CL 99 98 100 97*  CO2 25 26 24 27   GLUCOSE 103* 107* 107* 123*  BUN 21 18 19 17   CREATININE 0.87 0.86 1.04* 1.00  CALCIUM 8.9 8.9 8.9 8.9  MG  --   --  1.7 1.9    CBG: No results for input(s): "GLUCAP" in the last 168 hours.  Recent Results (from the past 240 hour(s))  SARS Coronavirus 2 by RT PCR (hospital order, performed in [facility] hospital lab) *cepheid single result test* Anterior Nasal Swab     Status: None   Collection Time: 04/03/23  4:50 PM   Specimen: Anterior Nasal Swab  Result Value Ref Range Status   SARS Coronavirus 2 by RT PCR NEGATIVE NEGATIVE Final    Comment: (NOTE) SARS-CoV-2 target nucleic acids are NOT DETECTED.  The SARS-CoV-2 RNA is generally detectable in upper and lower respiratory specimens during the acute phase of infection. The lowest concentration of SARS-CoV-2 viral copies this assay can detect is 250 copies / mL. A negative result does not preclude SARS-CoV-2 infection and  should not be used as the sole basis for treatment or other patient management decisions.  A negative result may occur with improper specimen collection / handling, submission of specimen other than nasopharyngeal swab, presence of viral mutation(s) within the areas targeted by this assay, and inadequate number of viral copies (<250 copies / mL). A negative result must be combined with clinical observations, patient history, and epidemiological information.  Fact Sheet for Patients:   RoadLapTop.co.za  Fact Sheet for Healthcare Providers: http://kim-miller.com/  This test is not yet approved or  cleared by the [place] FDA and has been authorized for detection and/or diagnosis of SARS-CoV-2 by FDA under an Emergency Use Authorization (EUA).   This EUA will remain in effect (meaning this test can be used) for the duration of the COVID-19 declaration under Section 564(b)(1) of the Act, 21 U.S.C. section 360bbb-3(b)(1), unless the authorization is terminated or revoked sooner.  Performed at [facility], [address] [place], [place] [address]   MRSA Next Gen by PCR, Nasal     Status: None   Collection Time: 04/03/23  8:45 PM   Specimen: Nasal Mucosa; Nasal Swab  Result Value Ref Range Status   MRSA by PCR Next Gen NOT DETECTED NOT DETECTED Final    Comment: (NOTE) The GeneXpert MRSA Assay (FDA approved for NASAL specimens only), is one component of a comprehensive MRSA colonization surveillance program. It is not intended to diagnose MRSA infection nor to guide or monitor treatment for MRSA infections. Test performance is not FDA approved in patients less than 75 years old. Performed at [facility], [address], [place], [place] [address]      Radiology Studies: No results found.  Scheduled Meds:  amiodarone  200 mg Oral BID   apixaban  5 mg Oral BID   Chlorhexidine Gluconate Cloth  6 each Topical Daily   guaiFENesin  600 mg Oral BID   metoprolol tartrate  75 mg Oral BID   polyethylene glycol  17 g Oral Daily   cyanocobalamin  500 mcg Oral Daily   Continuous Infusions:  azithromycin Stopped (04/06/23 1630)   cefTRIAXone (ROCEPHIN)  IV Stopped (04/06/23 1508)   magnesium sulfate bolus IVPB       LOS: 4 days   Time spent: 58 mins  Maanasa Aderhold Laural Benes, MD How to contact the [facility] Attending or Consulting provider 7A - 7P or covering provider during after hours 7P -7A, for this patient?  Check the care team in [facility] and look for a) attending/consulting [facility] provider listed and b) the [facility] team listed Log into [email or web address] and use [place]'s universal password to access. If you do not have the password, please contact the hospital operator. Locate the [facility] provider you are looking for under [facility] and  page to a number that you can be directly reached. If you still have difficulty reaching the provider, please page the [facility] (Director on Call) for the Hospitalists listed on amion for assistance.  04/07/2023, 12:44 PM

## 2023-04-07 NOTE — Plan of Care (Signed)
  Problem: Clinical Measurements: Goal: Ability to maintain a body temperature in the normal range will improve Outcome: Completed/Met   Problem: Pain Managment: Goal: General experience of comfort will improve Outcome: Completed/Met

## 2023-04-08 DIAGNOSIS — F410 Panic disorder [episodic paroxysmal anxiety] without agoraphobia: Secondary | ICD-10-CM | POA: Diagnosis not present

## 2023-04-08 DIAGNOSIS — I43 Cardiomyopathy in diseases classified elsewhere: Secondary | ICD-10-CM | POA: Diagnosis not present

## 2023-04-08 DIAGNOSIS — I4891 Unspecified atrial fibrillation: Secondary | ICD-10-CM | POA: Diagnosis not present

## 2023-04-08 DIAGNOSIS — R000 Tachycardia, unspecified: Secondary | ICD-10-CM

## 2023-04-08 DIAGNOSIS — J189 Pneumonia, unspecified organism: Secondary | ICD-10-CM | POA: Diagnosis not present

## 2023-04-08 DIAGNOSIS — I480 Paroxysmal atrial fibrillation: Secondary | ICD-10-CM | POA: Diagnosis not present

## 2023-04-08 MED ORDER — CLONAZEPAM 0.25 MG PO TBDP
0.2500 mg | ORAL_TABLET | Freq: Two times a day (BID) | ORAL | Status: DC | PRN
  Administered 2023-04-08 (×2): 0.25 mg via ORAL
  Filled 2023-04-08 (×2): qty 1

## 2023-04-08 MED ORDER — FUROSEMIDE 10 MG/ML IJ SOLN
20.0000 mg | Freq: Once | INTRAMUSCULAR | Status: AC
  Administered 2023-04-08: 20 mg via INTRAVENOUS
  Filled 2023-04-08: qty 2

## 2023-04-08 MED ORDER — AMIODARONE LOAD VIA INFUSION
150.0000 mg | Freq: Once | INTRAVENOUS | Status: AC
  Administered 2023-04-08: 150 mg via INTRAVENOUS
  Filled 2023-04-08: qty 83.34

## 2023-04-08 MED ORDER — DOXYCYCLINE HYCLATE 100 MG PO TABS
100.0000 mg | ORAL_TABLET | Freq: Two times a day (BID) | ORAL | Status: AC
  Administered 2023-04-08 (×2): 100 mg via ORAL
  Filled 2023-04-08 (×2): qty 1

## 2023-04-08 MED ORDER — AMIODARONE HCL IN DEXTROSE 360-4.14 MG/200ML-% IV SOLN
60.0000 mg/h | INTRAVENOUS | Status: AC
  Administered 2023-04-08 (×2): 60 mg/h via INTRAVENOUS
  Filled 2023-04-08 (×3): qty 200

## 2023-04-08 MED ORDER — AMIODARONE HCL IN DEXTROSE 360-4.14 MG/200ML-% IV SOLN
30.0000 mg/h | INTRAVENOUS | Status: DC
  Administered 2023-04-08: 30 mg/h via INTRAVENOUS
  Filled 2023-04-08 (×2): qty 200

## 2023-04-08 NOTE — Progress Notes (Signed)
Dr. Jenene Slicker recommended switching oral amiodarone to IV amiodarone bolus and load, orders written. She recommends possible TEE DCCV tomorrow. Have made NPO after midnight. Tentatively scheduled for 9am per discussion with Eber Jones with short stay and Bernie with echo. Will need full orders in AM if she is still in AF.

## 2023-04-08 NOTE — Plan of Care (Signed)
  Problem: Activity: Goal: Ability to tolerate increased activity will improve Outcome: Progressing   Problem: Respiratory: Goal: Ability to maintain adequate ventilation will improve Outcome: Progressing Goal: Ability to maintain a clear airway will improve Outcome: Progressing   Problem: Education: Goal: Knowledge of General Education information will improve Description: Including pain rating scale, medication(s)/side effects and non-pharmacologic comfort measures Outcome: Progressing   Problem: Health Behavior/Discharge Planning: Goal: Ability to manage health-related needs will improve Outcome: Progressing   Problem: Clinical Measurements: Goal: Ability to maintain clinical measurements within normal limits will improve Outcome: Progressing Goal: Will remain free from infection Outcome: Progressing Goal: Diagnostic test results will improve Outcome: Progressing Goal: Respiratory complications will improve Outcome: Progressing Goal: Cardiovascular complication will be avoided Outcome: Progressing   Problem: Activity: Goal: Risk for activity intolerance will decrease Outcome: Progressing   Problem: Nutrition: Goal: Adequate nutrition will be maintained Outcome: Progressing   Problem: Coping: Goal: Level of anxiety will decrease Outcome: Progressing   Problem: Elimination: Goal: Will not experience complications related to bowel motility Outcome: Progressing Goal: Will not experience complications related to urinary retention Outcome: Progressing   Problem: Safety: Goal: Ability to remain free from injury will improve Outcome: Progressing   Problem: Skin Integrity: Goal: Risk for impaired skin integrity will decrease Outcome: Progressing

## 2023-04-08 NOTE — Progress Notes (Signed)
Nurse to nurse report given to [place]. RN. IV Amio infusing with no signs or symptoms of complications. Patient alert and oriented x4 with no current complaints.

## 2023-04-08 NOTE — Progress Notes (Signed)
Progress Note  Patient Name: Brandi Rowe Date of Encounter: 04/08/2023  Primary Cardiologist: Marjo Bicker, MD  Subjective   No acute events overnight, patient gaining weight gradually during this hospitalization and HR has been ranging around 110 and 120s at rest, in atrial fibrillation on telemetry.  EKG today showed A-fib with RVR.  Patient reports having SOB and severe fatigue.  Inpatient Medications    Scheduled Meds:  apixaban  5 mg Oral BID   Chlorhexidine Gluconate Cloth  6 each Topical Daily   doxycycline  100 mg Oral Q12H   guaiFENesin  600 mg Oral BID   metoprolol tartrate  75 mg Oral BID   saccharomyces boulardii  250 mg Oral BID WC   cyanocobalamin  500 mcg Oral Daily   Continuous Infusions:  amiodarone 60 mg/hr (04/08/23 1102)   Followed by   amiodarone     cefTRIAXone (ROCEPHIN)  IV Stopped (04/07/23 1521)   PRN Meds: acetaminophen **OR** acetaminophen, albuterol, calcium carbonate, clonazePAM, dextromethorphan, fluticasone, loperamide, ondansetron, polyethylene glycol   Vital Signs    Vitals:   04/08/23 0410 04/08/23 0500 04/08/23 1056 04/08/23 1100  BP: (!) 126/97     Pulse: 92   68  Resp:    18  Temp: 98.1 F (36.7 C)     TempSrc: Oral     SpO2: 95%   94%  Weight:  54.8 kg 58.7 kg   Height:   5\' 4"  (1.626 m)     Intake/Output Summary (Last 24 hours) at 04/08/2023 1138 Last data filed at 04/07/2023 1900 Gross per 24 hour  Intake 670.65 ml  Output --  Net 670.65 ml   Filed Weights   04/07/23 0800 04/08/23 0500 04/08/23 1056  Weight: 54.9 kg 54.8 kg 58.7 kg    Telemetry     Personally reviewed.,  In atrial fibrillation, HR ranging between 110 and 120 bpm. ECG   A-fib with RVR, HR 120 bpm today.  Physical Exam   GEN: No acute distress.   Neck: No JVD. Cardiac: Irregular rate and rhythm, no murmur, rub, or gallop.  Respiratory: Nonlabored. Clear to auscultation bilaterally. GI: Soft, nontender, bowel sounds present. MS:  No edema; No deformity. Neuro:  Nonfocal. Psych: Alert and oriented x 3. Normal affect.  Labs    Chemistry Recent Labs  Lab 04/04/23 0459 04/05/23 0407 04/07/23 0423  NA 134* 136 133*  K 3.8 3.6 3.8  CL 98 100 97*  CO2 26 24 27   GLUCOSE 107* 107* 123*  BUN 18 19 17   CREATININE 0.86 1.04* 1.00  CALCIUM 8.9 8.9 8.9  GFRNONAA >60 59* >60  ANIONGAP 10 12 9      Hematology Recent Labs  Lab 04/03/23 1232 04/04/23 0459 04/05/23 0407  WBC 6.0 5.1 6.2  RBC 4.05 3.50* 3.69*  HGB 13.4 11.6* 12.3  HCT 41.0 35.4* 37.7  MCV 101.2* 101.1* 102.2*  MCH 33.1 33.1 33.3  MCHC 32.7 32.8 32.6  RDW 14.6 14.4 14.4  PLT 227 182 209    Cardiac Enzymes Recent Labs  Lab 04/03/23 1232 04/03/23 1413  TROPONINIHS 6 7    BNP Recent Labs  Lab 04/03/23 1232 04/05/23 0407  BNP 777.0* 565.0*     DDimerNo results for input(s): "DDIMER" in the last 168 hours.   Radiology    No results found.   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 On antibiotics.  Telemetry reviewed, in A-fib, HR between 110 and 120 bpm at rest.  Will administer IV Lasix 20 mg 1 dose today due to SOB symptoms.  LOS 5 days already. Patient has been on antibiotics for the last 5 days with no improvement in the A-fib heart rates. Continue metoprolol tartrate 75 mg twice daily and switch p.o. amiodarone to IV amiodarone drip for adequate rate control.  If she continues to have elevated HR more than 100 bpm at rest by tomorrow a.m., she will benefit from TEE guided DCCV on 04/09/2023.  She has very minimal difficulty swallowing for a long time but able to swallow food with no issues.  Okay to proceed with TEE. She was taking Eliquis once a  day prior to admission.  Currently on twice a day Eliquis.  Informed consent for TEE guided DCCV The risks and benefits of the procedure were explained to the patient which include, but not limited to, sore throat, completing, chest pain, infection, aspiration pneumonia, unsuccessful cardioversion, cardiac arrest, possibility of PPM implantation. Patient comprehended these risks and agreed to proceed with the procedure.  -Possible right lower lobe pneumonia chest x-ray, on admission.  On antibiotics, management per primary team.  Signed, Marjo Bicker, MD  04/08/2023, 11:38 AM

## 2023-04-08 NOTE — Progress Notes (Signed)
PROGRESS NOTE   Brandi Rowe  VWU:[id number] DOB: [birth year] DOA: 04/03/2023 PCP: Benita Stabile, MD   Chief Complaint  Patient presents with   Shortness of Breath   Leg Swelling   Palpitations   Chest Pain   Level of care: Stepdown  Brief Admission History:  67 y.o. 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 -Patient presents today secondary to dyspnea, lower extremity edema, palpitation, presents to ED secondary to palpitation, lightheadedness, dyspnea with exertion, as well orthopnea, progressive over the last few weeks, reports she is compliant with her metoprolol and Eliquis, no abdominal pain, no nausea, no vomiting, does endorse some intermittent chest pain, in the past but nothing currently. -In ED she was noted to be in A-fib with RVR heart rate in the 130s for which she was started on Cardizem drip, her chest x-ray significant for mid right lung opacity concerning for pneumonia, small pleural effusion, she had +3 edema, with elevated BNP 777, [facility] hospitalist consulted to admit for pneumonia and CHF.   Assessment and Plan:  Paroxysmal A-fib with RVR -Patient with known history of paroxysmal A-fib, she is on home metoprolol, and apixaban for anticoagulation-heart rate uncontrolled in the setting of acute illness - metoprolol 25 mg BID started by cardiology, increased to 50 mg BID 9/13 for better HR control, increased to 75 mg BID on 9/14 - continue apixaban for full anticoagulation - HR remains suboptimally controlled, added amiodarone 200 mg BID 9/15 - cardiology team starting IV amiodarone bolus and infusion and patient transferring to stepdown ICU - cardiology team to consider cardioversion in next 24 hours  if needed.  - Given additional Mg 2 gm IV on 9/15 to try to get serum Mg>2.  - recheck Mg in AM    Community Acquired Pneumonia -treated with IV Rocephin, azithromycin, complete course with 2 doses of doxycycline  -Will need repeat imaging in 6-8 weeks to ensure resolution of pneumonia after appropriate antibiotic treatment -continue mucolytics, cough suppressant    Acute HFrEF  -2D echo in 2022 with a preserved EF, but grade 1 diastolic dysfunction, patient appears to be with massive volume overload, +3 edema lower extremity edema, and BNP elevated at 777, this is most likely in setting of acute infection and A-fib with RVR -2D echo: LVEF 45-50% mildly decreased LV function -appreciate cardiology recs, given IV furosemide 20 mg x 1 dose  -Continue daily weight and strict ins and out -redsVest reading 28 which is reassuring  Echocardiogram 04/04/23: Left ventricular ejection fraction, by estimation, is 45 to 50%. The left ventricle has mildly decreased function. The left ventricle demonstrates global hypokinesis. Left ventricular diastolic parameters are indeterminate.   2. Right ventricular systolic function is normal. The right ventricular size is normal. There is mildly elevated pulmonary artery systolic pressure. The estimated right ventricular systolic pressure is 43.9 mmHg.   3. Left atrial size was moderately dilated.   4. Right atrial size was moderately dilated.   5. A small pericardial effusion is present. The pericardial effusion is posterior to the left ventricle.   6. The mitral valve is grossly normal. Mild mitral valve regurgitation.   7. Tricuspid valve regurgitation is moderate.   8. The aortic valve is tricuspid. Aortic valve regurgitation is not visualized.   9. The  inferior vena cava is dilated in size with <50% respiratory variability, suggesting right atrial pressure of 15 mmHg.   Comparison(s): Prior images reviewed side by side. LVEF mildly reduced at 45-50% in the  setting of atrial fibrillation. Mildly elevated estimated RVSP. Mild mitral and moderate tricuspid regurgitation.    history tobacco use disorder - patient quit smoking 5 weeks ago   Anxiety disorder, unspecified Continue home dose clonazepam  Constipation  - added miralax 17 gram po daily with BM reported    DVT prophylaxis: apixaban  Code Status: Full  Family Communication:  Disposition: anticipate home with [facility]   Consultants:  cardiology  Procedures:   Antimicrobials:  Ceftriaxone 9/12>> Azithromycin 9/12>>   Subjective: Continues to have bouts of shortness of breath and palpitations  Objective: Vitals:   04/08/23 0410 04/08/23 0500 04/08/23 1056 04/08/23 1100  BP: (!) 126/97     Pulse: 92   68  Resp:    18  Temp: 98.1 F (36.7 C)     TempSrc: Oral     SpO2: 95%   94%  Weight:  54.8 kg 58.7 kg   Height:   5\' 4"  (1.626 m)     Intake/Output Summary (Last 24 hours) at 04/08/2023 1135 Last data filed at 04/07/2023 1900 Gross per 24 hour  Intake 670.65 ml  Output --  Net 670.65 ml   Filed Weights   04/07/23 0800 04/08/23 0500 04/08/23 1056  Weight: 54.9 kg 54.8 kg 58.7 kg   Examination:  General exam: Appears calm and comfortable  Respiratory system: no increased WOB, BBS heard. Respiratory effort normal. Cardiovascular system: irregularly irregular, normal S1 & S2 heard. No JVD, murmurs, rubs, gallops or clicks. No pedal edema. Gastrointestinal system: Abdomen is nondistended, soft and nontender. No organomegaly or masses felt. Normal bowel sounds heard. Central nervous system: Alert and oriented. No focal neurological deficits. Extremities: Symmetric 5 x 5 power. Skin: No rashes, lesions or ulcers. Psychiatry: Judgement and insight appear normal. Mood & affect appropriate.   Data Reviewed: I have personally reviewed following labs and imaging studies  CBC: Recent Labs  Lab 04/03/23 1232 04/04/23 0459 04/05/23 0407  WBC 6.0 5.1 6.2  NEUTROABS 4.0   --   --   HGB 13.4 11.6* 12.3  HCT 41.0 35.4* 37.7  MCV 101.2* 101.1* 102.2*  PLT 227 182 209    Basic Metabolic Panel: Recent Labs  Lab 04/03/23 1232 04/04/23 0459 04/05/23 0407 04/07/23 0423  NA 135 134* 136 133*  K 3.7 3.8 3.6 3.8  CL 99 98 100 97*  CO2 25 26 24 27   GLUCOSE 103* 107* 107* 123*  BUN 21 18 19 17   CREATININE 0.87 0.86 1.04* 1.00  CALCIUM 8.9 8.9 8.9 8.9  MG  --   --  1.7 1.9    CBG: No results for input(s): "GLUCAP" in the last 168 hours.  Recent Results (from the past 240 hour(s))  SARS Coronavirus 2 by RT PCR (hospital order, performed in [facility] hospital lab) *cepheid single result test* Anterior Nasal Swab     Status: None   Collection Time: 04/03/23  4:50 PM   Specimen: Anterior Nasal Swab  Result Value Ref Range Status   SARS Coronavirus 2 by RT PCR NEGATIVE NEGATIVE Final    Comment: (NOTE) SARS-CoV-2 target nucleic acids are NOT DETECTED.  The SARS-CoV-2 RNA is generally detectable in upper and lower respiratory specimens during the acute phase of infection. The lowest concentration of SARS-CoV-2 viral copies this assay  can detect is 250 copies / mL. A negative result does not preclude SARS-CoV-2 infection and should not be used as the sole basis for treatment or other patient management decisions.  A negative result may occur with improper specimen collection / handling, submission of specimen other than nasopharyngeal swab, presence of viral mutation(s) within the areas targeted by this assay, and inadequate number of viral copies (<250 copies / mL). A negative result must be combined with clinical observations, patient history, and epidemiological information.  Fact Sheet for Patients:   RoadLapTop.co.za  Fact Sheet for Healthcare Providers: http://kim-miller.com/  This test is not yet approved or  cleared by the [place] FDA and has been authorized for detection and/or diagnosis  of SARS-CoV-2 by FDA under an Emergency Use Authorization (EUA).  This EUA will remain in effect (meaning this test can be used) for the duration of the COVID-19 declaration under Section 564(b)(1) of the Act, 21 U.S.C. section 360bbb-3(b)(1), unless the authorization is terminated or revoked sooner.  Performed at [facility], [address], [place], [place] [address]   MRSA Next Gen by PCR, Nasal     Status: None   Collection Time: 04/03/23  8:45 PM   Specimen: Nasal Mucosa; Nasal Swab  Result Value Ref Range Status   MRSA by PCR Next Gen NOT DETECTED NOT DETECTED Final    Comment: (NOTE) The GeneXpert MRSA Assay (FDA approved for NASAL specimens only), is one component of a comprehensive MRSA colonization surveillance program. It is not intended to diagnose MRSA infection nor to guide or monitor treatment for MRSA infections. Test performance is not FDA approved in patients less than 38 years old. Performed at [facility], [address] [place], [place] [address]      Radiology Studies: No results found.  Scheduled Meds:  apixaban  5 mg Oral BID   Chlorhexidine Gluconate Cloth  6 each Topical Daily   doxycycline  100 mg Oral Q12H   guaiFENesin  600 mg Oral BID   metoprolol tartrate  75 mg Oral BID   saccharomyces boulardii  250 mg Oral BID WC   cyanocobalamin  500 mcg Oral Daily   Continuous Infusions:  amiodarone 60 mg/hr (04/08/23 1102)   Followed by   amiodarone     cefTRIAXone (ROCEPHIN)  IV Stopped (04/07/23 1521)     LOS: 5 days   Critical Care Procedure Note Authorized and Performed by: Maryln Manuel MD  Total Critical Care time:  57 min Due to a high probability of clinically significant, life threatening deterioration, the patient required my highest level of preparedness to intervene emergently and I personally spent this critical care time directly and personally managing the patient.  This critical care time included obtaining a history; examining the  patient, pulse oximetry; ordering and review of studies; arranging urgent treatment with development of a management plan; evaluation of patient's response of treatment; frequent reassessment; and discussions with other providers.  This critical care time was performed to assess and manage the high probability of imminent and life threatening deterioration that could result in multi-organ failure.  It was exclusive of separately billable procedures and treating other patients and teaching time.    Standley Dakins, MD How to contact the [facility] Attending or Consulting provider 7A - 7P or covering provider during after hours 7P -7A, for this patient?  Check the care team in [facility] and look for a) attending/consulting [facility] provider listed and b) the [facility] team listed Log into [email or web address] and use  [place]'s universal password to access. If you do not have the password, please contact the hospital operator. Locate the [facility] provider you are looking for under [facility] and page to a number that you can be directly reached. If you still have difficulty reaching the provider, please page the [facility] (Director on Call) for the Hospitalists listed on amion for assistance.  04/08/2023, 11:35 AM

## 2023-04-09 ENCOUNTER — Inpatient Hospital Stay (HOSPITAL_COMMUNITY): Payer: Medicare Other | Admitting: Certified Registered"

## 2023-04-09 ENCOUNTER — Encounter (HOSPITAL_COMMUNITY): Payer: Self-pay | Admitting: Internal Medicine

## 2023-04-09 ENCOUNTER — Other Ambulatory Visit: Payer: Self-pay

## 2023-04-09 ENCOUNTER — Encounter (HOSPITAL_COMMUNITY)
Admission: EM | Disposition: A | Discharge status: Home / Self Care | Payer: Self-pay | Source: Home / Self Care | Attending: Family Medicine

## 2023-04-09 ENCOUNTER — Inpatient Hospital Stay (HOSPITAL_COMMUNITY): Payer: Medicare Other

## 2023-04-09 DIAGNOSIS — I43 Cardiomyopathy in diseases classified elsewhere: Secondary | ICD-10-CM | POA: Diagnosis not present

## 2023-04-09 DIAGNOSIS — F419 Anxiety disorder, unspecified: Secondary | ICD-10-CM

## 2023-04-09 DIAGNOSIS — F17210 Nicotine dependence, cigarettes, uncomplicated: Secondary | ICD-10-CM

## 2023-04-09 DIAGNOSIS — J189 Pneumonia, unspecified organism: Secondary | ICD-10-CM | POA: Diagnosis not present

## 2023-04-09 DIAGNOSIS — I340 Nonrheumatic mitral (valve) insufficiency: Secondary | ICD-10-CM | POA: Diagnosis not present

## 2023-04-09 DIAGNOSIS — I480 Paroxysmal atrial fibrillation: Secondary | ICD-10-CM | POA: Diagnosis not present

## 2023-04-09 DIAGNOSIS — I10 Essential (primary) hypertension: Secondary | ICD-10-CM

## 2023-04-09 DIAGNOSIS — I4891 Unspecified atrial fibrillation: Secondary | ICD-10-CM | POA: Diagnosis not present

## 2023-04-09 DIAGNOSIS — R000 Tachycardia, unspecified: Secondary | ICD-10-CM | POA: Diagnosis not present

## 2023-04-09 DIAGNOSIS — I361 Nonrheumatic tricuspid (valve) insufficiency: Secondary | ICD-10-CM

## 2023-04-09 HISTORY — PX: CARDIOVERSION: SHX1299

## 2023-04-09 HISTORY — PX: TEE WITHOUT CARDIOVERSION: SHX5443

## 2023-04-09 LAB — BASIC METABOLIC PANEL
Anion gap: 11 (ref 5–15)
BUN: 18 mg/dL (ref 8–23)
CO2: 24 mmol/L (ref 22–32)
Calcium: 8.8 mg/dL — ABNORMAL LOW (ref 8.9–10.3)
Chloride: 99 mmol/L (ref 98–111)
Creatinine, Ser: 1.12 mg/dL — ABNORMAL HIGH (ref 0.44–1.00)
GFR, Estimated: 54 mL/min — ABNORMAL LOW (ref >60)
Glucose, Bld: 112 mg/dL — ABNORMAL HIGH (ref 70–99)
Potassium: 3.8 mmol/L (ref 3.5–5.1)
Sodium: 134 mmol/L — ABNORMAL LOW (ref 135–145)

## 2023-04-09 LAB — CBC
HCT: 36.7 % (ref 36.0–46.0)
Hemoglobin: 12.1 g/dL (ref 12.0–15.0)
MCH: 33 pg (ref 26.0–34.0)
MCHC: 33 g/dL (ref 30.0–36.0)
MCV: 100 fL (ref 80.0–100.0)
Platelets: 191 10*3/uL (ref 150–400)
RBC: 3.67 MIL/uL — ABNORMAL LOW (ref 3.87–5.11)
RDW: 13.9 % (ref 11.5–15.5)
WBC: 5.6 10*3/uL (ref 4.0–10.5)
nRBC: 0 % (ref 0.0–0.2)

## 2023-04-09 LAB — MAGNESIUM: Magnesium: 1.8 mg/dL (ref 1.7–2.4)

## 2023-04-09 SURGERY — CARDIOVERSION
Anesthesia: General

## 2023-04-09 MED ORDER — METOPROLOL TARTRATE 25 MG PO TABS: 25.0000 mg | ORAL_TABLET | Freq: Two times a day (BID) | ORAL | Status: DC | PRN

## 2023-04-09 MED ORDER — PROPOFOL 500 MG/50ML IV EMUL
INTRAVENOUS | Status: AC
  Filled 2023-04-09: qty 50

## 2023-04-09 MED ORDER — PROPOFOL 10 MG/ML IV BOLUS
INTRAVENOUS | Status: AC
  Filled 2023-04-09: qty 20

## 2023-04-09 MED ORDER — ORAL CARE MOUTH RINSE: 15.0000 mL | Freq: Once | OROMUCOSAL | Status: DC

## 2023-04-09 MED ORDER — CHLORHEXIDINE GLUCONATE 0.12 % MT SOLN: 15.0000 mL | Freq: Once | OROMUCOSAL | Status: DC

## 2023-04-09 MED ORDER — LACTATED RINGERS IV SOLN: INTRAVENOUS | Status: DC

## 2023-04-09 MED ORDER — MIDAZOLAM HCL 2 MG/2ML IJ SOLN
INTRAMUSCULAR | Status: DC | PRN
  Administered 2023-04-09: 1 mg via INTRAVENOUS

## 2023-04-09 MED ORDER — MIDAZOLAM HCL 2 MG/2ML IJ SOLN
INTRAMUSCULAR | Status: AC
  Filled 2023-04-09: qty 2

## 2023-04-09 MED ORDER — PROPOFOL 10 MG/ML IV BOLUS
INTRAVENOUS | Status: DC | PRN
  Administered 2023-04-09: 100 mg via INTRAVENOUS

## 2023-04-09 MED ORDER — DEXTROMETHORPHAN POLISTIREX ER 30 MG/5ML PO SUER: 30.0000 mg | Freq: Two times a day (BID) | ORAL | 0 refills | Status: DC | PRN

## 2023-04-09 MED ORDER — PHENYLEPHRINE 80 MCG/ML (10ML) SYRINGE FOR IV PUSH (FOR BLOOD PRESSURE SUPPORT)
PREFILLED_SYRINGE | INTRAVENOUS | Status: DC | PRN
Start: 2023-04-09 — End: 2023-04-09
  Administered 2023-04-09: 240 ug via INTRAVENOUS
  Administered 2023-04-09 (×4): 160 ug via INTRAVENOUS
  Administered 2023-04-09 (×2): 240 ug via INTRAVENOUS

## 2023-04-09 MED ORDER — BUTAMBEN-TETRACAINE-BENZOCAINE 2-2-14 % EX AERO
INHALATION_SPRAY | CUTANEOUS | Status: DC | PRN
Start: 2023-04-09 — End: 2023-04-09
  Administered 2023-04-09: 1 via TOPICAL

## 2023-04-09 MED ORDER — AMIODARONE HCL 200 MG PO TABS
200.0000 mg | ORAL_TABLET | Freq: Every day | ORAL | Status: DC
  Administered 2023-04-09: 200 mg via ORAL
  Filled 2023-04-09: qty 1

## 2023-04-09 MED ORDER — BUTAMBEN-TETRACAINE-BENZOCAINE 2-2-14 % EX AERO
INHALATION_SPRAY | CUTANEOUS | Status: AC
  Filled 2023-04-09: qty 5

## 2023-04-09 MED ORDER — SODIUM CHLORIDE 0.9 % IV SOLN: INTRAVENOUS | Status: DC

## 2023-04-09 MED ORDER — PHENYLEPHRINE 80 MCG/ML (10ML) SYRINGE FOR IV PUSH (FOR BLOOD PRESSURE SUPPORT)
PREFILLED_SYRINGE | INTRAVENOUS | Status: AC
  Filled 2023-04-09: qty 10

## 2023-04-09 MED ORDER — LIDOCAINE HCL (PF) 2 % IJ SOLN
INTRAMUSCULAR | Status: AC
  Filled 2023-04-09: qty 5

## 2023-04-09 MED ORDER — ATROPINE SULFATE 0.4 MG/ML IV SOLN
INTRAVENOUS | Status: AC
  Filled 2023-04-09: qty 1

## 2023-04-09 MED ORDER — LIDOCAINE 2% (20 MG/ML) 5 ML SYRINGE
INTRAMUSCULAR | Status: DC | PRN
  Administered 2023-04-09: 80 mg via INTRAVENOUS

## 2023-04-09 MED ORDER — PROPOFOL 500 MG/50ML IV EMUL
INTRAVENOUS | Status: DC | PRN
Start: 2023-04-09 — End: 2023-04-09
  Administered 2023-04-09: 75 ug/kg/min via INTRAVENOUS

## 2023-04-09 MED ORDER — AMIODARONE HCL 200 MG PO TABS: 200.0000 mg | ORAL_TABLET | Freq: Every day | ORAL | 1 refills | Status: DC

## 2023-04-09 NOTE — Interval H&P Note (Signed)
History and Physical Interval Note:  04/09/2023 10:38 AM  Brandi Rowe  has presented today for surgery, with the diagnosis of a-fib.  The various methods of treatment have been discussed with the patient and family. After consideration of risks, benefits and other options for treatment, the patient has consented to  Procedure(s): CARDIOVERSION (N/A) TRANSESOPHAGEAL ECHOCARDIOGRAM (TEE) (N/A) as a surgical intervention.  The patient's history has been reviewed, patient examined, no change in status, stable for surgery.  I have reviewed the patient's chart and labs.  Questions were answered to the patient's satisfaction.     Keyshia Orwick P Clydean Posas [facility]

## 2023-04-09 NOTE — Discharge Summary (Addendum)
Physician Discharge Summary  Brandi Rowe XLK:[id number] DOB: December 25, 1955 DOA: 04/03/2023  PCP: Benita Stabile, MD Cardiology: [facility] [place] Admit date: 04/03/2023 Discharge date: 04/09/2023  Admitted From:  Home  Disposition:  Home   Recommendations for Outpatient Follow-up:  Follow up with cardiology on 04/30/23 as scheduled Follow up with PCP in 1-2 weeks  Recommend repeat CXR in 6-8 weeks to ensure full resolution of pneumonia  Discharge Condition: STABLE   CODE STATUS: Full Diet: Heart healthy    Brief Hospitalization Summary: Please see all hospital notes, images, labs for full details of the hospitalization. Admission provider HPI:  67 y.o. 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 -Patient presents today secondary to dyspnea, lower extremity edema, palpitation, presents to ED secondary to palpitation, lightheadedness, dyspnea with exertion, as well orthopnea, progressive over the last few weeks, reports she is compliant with her metoprolol and Eliquis, no abdominal pain, no nausea, no vomiting, does endorse some intermittent chest pain, in the past but nothing currently. -In ED she was noted to be in A-fib with RVR heart rate in the 130s for which she was started on Cardizem drip, her chest x-ray significant for mid right lung opacity concerning for pneumonia, small pleural effusion, she had +3 edema, with elevated BNP 777, [facility] consulted to admit for pneumonia and CHF.  HOSPITAL COURSE BY PROBLEM LIST  Paroxysmal A-fib with RVR -Patient with known history of paroxysmal A-fib, she is on home metoprolol, and apixaban for anticoagulation-heart rate uncontrolled in the setting of acute  illness - metoprolol 25 mg BID started by cardiology, increased to 50 mg BID 9/13 for better HR control, increased to 75 mg BID on 9/14 - continue apixaban for full anticoagulation - HR remains suboptimally controlled, added amiodarone 200 mg BID 9/15 - 9/16: cardiology team starting IV amiodarone bolus and infusion and patient transferred to stepdown ICU - 9/17: cardiology team completed TEE/ cardioversion: ok to DC home  - continue amiodarone 200 mg daily  - changed metoprolol to 25 mg BID PRN palpitations or HR>100 - outpatient follow up arranged for cardiology appt on 04/30/23 for recheck    Community Acquired Pneumonia - TREATED  -treated with IV Rocephin, azithromycin, completed course with doxycycline  -Will need repeat imaging in 6-8 weeks to ensure resolution of pneumonia after appropriate antibiotic treatment -also treated with mucolytics, cough suppressant    Acute HFrEF  -2D echo in 2022 with a preserved EF, but grade 1 diastolic dysfunction, patient appears to be with massive volume overload, +3 edema lower extremity edema, and BNP elevated at 777, this is most likely in setting of acute infection and A-fib with RVR -2D echo: LVEF 45-50% mildly decreased LV function -appreciate cardiology recs, given IV furosemide 20 mg x 1 dose  -Continue daily weight and strict ins and out -redsVest reading 28 which is reassuring  Echocardiogram 04/04/23: Left ventricular ejection fraction, by estimation, is 45 to 50%. The left ventricle has mildly decreased function. The left ventricle demonstrates global hypokinesis. Left ventricular diastolic parameters are indeterminate.   2. Right ventricular systolic function is normal. The right ventricular size is normal. There is mildly elevated pulmonary artery systolic pressure. The estimated right ventricular systolic pressure is 43.9 mmHg.   3. Left atrial size was moderately dilated.  4. Right atrial size was moderately dilated.   5. A small  pericardial effusion is present. The pericardial effusion is posterior to the left ventricle.   6. The mitral valve is grossly normal. Mild mitral valve regurgitation.   7. Tricuspid valve regurgitation is moderate.   8. The aortic valve is tricuspid. Aortic valve regurgitation is not visualized.   9. The inferior vena cava is dilated in size with <50% respiratory variability, suggesting right atrial pressure of 15 mmHg.   Comparison(s): Prior images reviewed side by side. LVEF mildly reduced at 45-50% in the setting of atrial fibrillation. Mildly elevated estimated RVSP. Mild mitral and moderate tricuspid regurgitation.    history tobacco use disorder - patient quit smoking 5 weeks ago   Anxiety disorder, unspecified Continue home dose clonazepam   Constipation  - treated with miralax 17 gram po daily with BM reported    Discharge Diagnoses:  Principal Problem:   Pneumonia of right middle lobe due to infectious organism Active Problems:   Anxiety disorder, unspecified   Paroxysmal atrial fibrillation (HCC)   Atrial fibrillation with RVR (HCC)   Tachycardia induced cardiomyopathy (HCC)   Discharge Instructions:  Allergies as of 04/09/2023       Reactions   Dust Mite Extract Other (See Comments)   Grass Extracts [gramineae Pollens] Other (See Comments)   Dust, mold, mites Congestion   Grass Pollen(k-o-r-t-swt Vern) Other (See Comments)   Other Other (See Comments)   3-Hydroxy-3-Methylglutaryl-Coenzyme A Reductase Inhibitor   Statins Other (See Comments)   Stiffness in body        Medication List     TAKE these medications    acetaminophen 500 MG tablet Commonly known as: TYLENOL Take 500 mg by mouth 2 (two) times daily as needed for moderate pain or headache.   amiodarone 200 MG tablet Commonly known as: PACERONE Take 1 tablet (200 mg total) by mouth daily. Start taking on: April 10, 2023   apixaban 5 MG Tabs tablet Commonly known as: ELIQUIS Take 1  tablet (5 mg total) by mouth 2 (two) times daily.   calcium carbonate 500 MG chewable tablet Commonly known as: TUMS - dosed in mg elemental calcium Chew 1 tablet by mouth daily as needed for indigestion or heartburn.   clonazePAM 1 MG tablet Commonly known as: KLONOPIN Take 1 tablet (1 mg total) by mouth daily as needed for anxiety.   cyanocobalamin 1000 MCG tablet Commonly known as: VITAMIN B12 Take by mouth every other day.   dextromethorphan 30 MG/5ML liquid Commonly known as: DELSYM Take 5 mLs (30 mg total) by mouth 2 (two) times daily as needed for cough.   diphenhydrAMINE 25 MG tablet Commonly known as: BENADRYL Take 25 mg by mouth at bedtime as needed for allergies.   fluticasone 50 MCG/ACT nasal spray Commonly known as: FLONASE Place 2 sprays into both nostrils daily as needed for allergies.   loperamide 2 MG capsule Commonly known as: IMODIUM as needed for diarrhea or loose stools.   metoprolol tartrate 25 MG tablet Commonly known as: LOPRESSOR Take 1 tablet (25 mg total) by mouth 2 (two) times daily as needed (palpitations or HR>100). What changed:  how much to take when to take this reasons to take this   REFRESH OP Place 1 drop into both eyes daily as needed (dry eyes).        Follow-up Information     Ellsworth Lennox, PA-C Follow up.   Specialties: Cardiology, Cardiology Why: [facility] -  [place] location in [facility] - cardiology follow-up arranged with PA on Tuesday Apr 30, 2023 at 3:30 PM. Please arrive 15 minutes prior to appointment time to check in. Contact information: [address] [place] [address] [phone]                Allergies  Allergen Reactions   Dust Mite Extract Other (See Comments)   Grass Extracts [Gramineae Pollens] Other (See Comments)    Dust, mold, mites Congestion   Grass Pollen(K-O-R-T-Swt Vern) Other (See Comments)   Other Other (See Comments)     3-Hydroxy-3-Methylglutaryl-Coenzyme A Reductase Inhibitor   Statins Other (See Comments)    Stiffness in body   Allergies as of 04/09/2023       Reactions   Dust Mite Extract Other (See Comments)   Grass Extracts [gramineae Pollens] Other (See Comments)   Dust, mold, mites Congestion   Grass Pollen(k-o-r-t-swt Vern) Other (See Comments)   Other Other (See Comments)   3-Hydroxy-3-Methylglutaryl-Coenzyme A Reductase Inhibitor   Statins Other (See Comments)   Stiffness in body        Medication List     TAKE these medications    acetaminophen 500 MG tablet Commonly known as: TYLENOL Take 500 mg by mouth 2 (two) times daily as needed for moderate pain or headache.   amiodarone 200 MG tablet Commonly known as: PACERONE Take 1 tablet (200 mg total) by mouth daily. Start taking on: April 10, 2023   apixaban 5 MG Tabs tablet Commonly known as: ELIQUIS Take 1 tablet (5 mg total) by mouth 2 (two) times daily.   calcium carbonate 500 MG chewable tablet Commonly known as: TUMS - dosed in mg elemental calcium Chew 1 tablet by mouth daily as needed for indigestion or heartburn.   clonazePAM 1 MG tablet Commonly known as: KLONOPIN Take 1 tablet (1 mg total) by mouth daily as needed for anxiety.   cyanocobalamin 1000 MCG tablet Commonly known as: VITAMIN B12 Take by mouth every other day.   dextromethorphan 30 MG/5ML liquid Commonly known as: DELSYM Take 5 mLs (30 mg total) by mouth 2 (two) times daily as needed for cough.   diphenhydrAMINE 25 MG tablet Commonly known as: BENADRYL Take 25 mg by mouth at bedtime as needed for allergies.   fluticasone 50 MCG/ACT nasal spray Commonly known as: FLONASE Place 2 sprays into both nostrils daily as needed for allergies.   loperamide 2 MG capsule Commonly known as: IMODIUM as needed for diarrhea or loose stools.   metoprolol tartrate 25 MG tablet Commonly known as: LOPRESSOR Take 1 tablet (25 mg total) by mouth 2 (two)  times daily as needed (palpitations or HR>100). What changed:  how much to take when to take this reasons to take this   REFRESH OP Place 1 drop into both eyes daily as needed (dry eyes).        Procedures/Studies: ECHOCARDIOGRAM COMPLETE  Result Date: 04/04/2023    ECHOCARDIOGRAM REPORT   Patient Name:   Brandi Rowe Date of Exam: 04/04/2023 Medical Rec #:  [id number]       Height:       64.0 in Accession #:    [id number]      Weight:       123.0 lb Date of Birth:  01/30/1956       BSA:          1.591 m Patient Age:    53 years  BP:           120/90 mmHg Patient Gender: F               HR:           85 bpm. [facility] Procedure: 2D Echo, Cardiac Doppler and Color Doppler Indications:    CHF-Acute Diastolic I50.31  History:        Patient has prior history of Echocardiogram examinations, most                 recent 07/18/2021. Arrythmias:Atrial Fibrillation; Risk                 Factors:Current Smoker. White coat syndrome without diagnosis of                 hypertension.  Sonographer:    Celesta Gentile RCS Referring Phys: [id number] DAWOOD S ELGERGAWY IMPRESSIONS  1. Left ventricular ejection fraction, by estimation, is 45 to 50%. The left ventricle has mildly decreased function. The left ventricle demonstrates global hypokinesis. Left ventricular diastolic parameters are indeterminate.  2. Right ventricular systolic function is normal. The right ventricular size is normal. There is mildly elevated pulmonary artery systolic pressure. The estimated right ventricular systolic pressure is 43.9 mmHg.  3. Left atrial size was moderately dilated.  4. Right atrial size was moderately dilated.  5. A small pericardial effusion is present. The pericardial effusion is posterior to the left ventricle.  6. The mitral valve is grossly normal. Mild mitral valve regurgitation.  7. Tricuspid valve regurgitation is moderate.  8. The aortic valve is tricuspid. Aortic valve regurgitation is not  visualized.  9. The inferior vena cava is dilated in size with <50% respiratory variability, suggesting right atrial pressure of 15 mmHg. Comparison(s): Prior images reviewed side by side. LVEF mildly reduced at 45-50% in the setting of atrial fibrillation. Mildly elevated estimated RVSP. Mild mitral and moderate tricuspid regurgitation. FINDINGS  Left Ventricle: Left ventricular ejection fraction, by estimation, is 45 to 50%. The left ventricle has mildly decreased function. The left ventricle demonstrates global hypokinesis. The left ventricular internal cavity size was normal in size. There is  no left ventricular hypertrophy. Left ventricular diastolic function could not be evaluated due to atrial fibrillation. Left ventricular diastolic parameters are indeterminate. Right Ventricle: The right ventricular size is normal. No increase in right ventricular wall thickness. Right ventricular systolic function is normal. There is mildly elevated pulmonary artery systolic pressure. The tricuspid regurgitant velocity is 2.69  m/s, and with an assumed right atrial pressure of 15 mmHg, the estimated right ventricular systolic pressure is 43.9 mmHg. Left Atrium: Left atrial size was moderately dilated. Right Atrium: Right atrial size was moderately dilated. Pericardium: A small pericardial effusion is present. The pericardial effusion is posterior to the left ventricle. Presence of epicardial fat layer. Mitral Valve: The mitral valve is grossly normal. Mild mitral valve regurgitation. Tricuspid Valve: The tricuspid valve is grossly normal. Tricuspid valve regurgitation is moderate. Aortic Valve: The aortic valve is tricuspid. Aortic valve regurgitation is not visualized. Pulmonic Valve: The pulmonic valve was grossly normal. Pulmonic valve regurgitation is trivial. Aorta: The aortic root is normal in size and structure. Venous: The inferior vena cava is dilated in size with less than 50% respiratory variability, suggesting  right atrial pressure of 15 mmHg. IAS/Shunts: No atrial level shunt detected by color flow Doppler.  LEFT VENTRICLE PLAX 2D LVIDd:         4.10 cm LVIDs:  3.00 cm LV PW:         1.00 cm LV IVS:        0.80 cm LVOT diam:     2.00 cm LV SV:         38 LV SV Index:   24 LVOT Area:     3.14 cm  RIGHT VENTRICLE TAPSE (M-mode): 1.3 cm LEFT ATRIUM             Index        RIGHT ATRIUM           Index LA diam:        4.20 cm 2.64 cm/m   RA Area:     23.10 cm LA Vol (A2C):   61.4 ml 38.59 ml/m  RA Volume:   77.30 ml  48.58 ml/m LA Vol (A4C):   74.0 ml 46.51 ml/m LA Biplane Vol: 67.8 ml 42.61 ml/m  AORTIC VALVE LVOT Vmax:   70.00 cm/s LVOT Vmean:  48.700 cm/s LVOT VTI:    0.121 m  AORTA Ao Root diam: 3.00 cm MITRAL VALVE               TRICUSPID VALVE MV Area (PHT): 5.02 cm    TR Peak grad:   28.9 mmHg MV Decel Time: 151 msec    TR Vmax:        269.00 cm/s MV E velocity: 75.40 cm/s                            SHUNTS                            Systemic VTI:  0.12 m                            Systemic Diam: 2.00 cm Nona Dell MD Electronically signed by Nona Dell MD Signature Date/Time: 04/04/2023/2:26:47 PM    Final    DG Chest 2 View  Result Date: 04/03/2023 CLINICAL DATA:  Shortness of breath EXAM: CHEST - 2 VIEW COMPARISON:  Chest radiograph 07/18/2021 FINDINGS: The heart is borderline enlarged. The upper mediastinal contours are normal The lungs hyperinflated with flattening of the diaphragms consistent with underlying COPD. There are patchy opacities in the right middle lobe superimposed on chronically coarsened interstitial markings. There may be a small right pleural effusion. There is no pneumothorax The bones are demineralized. There is age-indeterminate compression deformity a midthoracic vertebral body. IMPRESSION: 1. Suspect right middle lobe pneumonia and possible small right pleural effusion superimposed on underlying COPD. Recommend follow-up radiographs in 6-8 weeks to ensure  resolution. 2. Age-indeterminate compression deformity a midthoracic vertebral body. Electronically Signed   By: Lesia Hausen M.D.   On: 04/03/2023 12:15     Subjective: Pt feels well after procedure, eager to get home.  No specific complaints.   Discharge Exam: Vitals:   04/09/23 1200 04/09/23 1300  BP: (!) 143/96 (!) 152/105  Pulse: (!) 53 61  Resp: (!) 21 (!) 23  Temp:    SpO2: 98% 97%   Vitals:   04/09/23 1000 04/09/23 1120 04/09/23 1200 04/09/23 1300  BP: (!) 124/99  (!) 143/96 (!) 152/105  Pulse: 62  (!) 53 61  Resp: (!) 22  (!) 21 (!) 23  Temp:  (!) 97.3 F (36.3 C)    TempSrc:  Oral  SpO2: 96%  98% 97%  Weight:      Height:       General: Pt is alert, awake, not in acute distress Cardiovascular: normal S1/S2 +, no rubs, no gallops Respiratory: CTA bilaterally, no wheezing, no rhonchi Abdominal: Soft, NT, ND, bowel sounds + Extremities:  trace pretibial edema, no cyanosis   The results of significant diagnostics from this hospitalization (including imaging, microbiology, ancillary and laboratory) are listed below for reference.     Microbiology: Recent Results (from the past 240 hour(s))  SARS Coronavirus 2 by RT PCR (hospital order, performed in [facility] hospital lab) *cepheid single result test* Anterior Nasal Swab     Status: None   Collection Time: 04/03/23  4:50 PM   Specimen: Anterior Nasal Swab  Result Value Ref Range Status   SARS Coronavirus 2 by RT PCR NEGATIVE NEGATIVE Final    Comment: (NOTE) SARS-CoV-2 target nucleic acids are NOT DETECTED.  The SARS-CoV-2 RNA is generally detectable in upper and lower respiratory specimens during the acute phase of infection. The lowest concentration of SARS-CoV-2 viral copies this assay can detect is 250 copies / mL. A negative result does not preclude SARS-CoV-2 infection and should not be used as the sole basis for treatment or other patient management decisions.  A negative result may occur  with improper specimen collection / handling, submission of specimen other than nasopharyngeal swab, presence of viral mutation(s) within the areas targeted by this assay, and inadequate number of viral copies (<250 copies / mL). A negative result must be combined with clinical observations, patient history, and epidemiological information.  Fact Sheet for Patients:   RoadLapTop.co.za  Fact Sheet for Healthcare Providers: http://kim-miller.com/  This test is not yet approved or  cleared by the [place] FDA and has been authorized for detection and/or diagnosis of SARS-CoV-2 by FDA under an Emergency Use Authorization (EUA).  This EUA will remain in effect (meaning this test can be used) for the duration of the COVID-19 declaration under Section 564(b)(1) of the Act, 21 U.S.C. section 360bbb-3(b)(1), unless the authorization is terminated or revoked sooner.  Performed at [facility], [address], [place], [place] [address]   MRSA Next Gen by PCR, Nasal     Status: None   Collection Time: 04/03/23  8:45 PM   Specimen: Nasal Mucosa; Nasal Swab  Result Value Ref Range Status   MRSA by PCR Next Gen NOT DETECTED NOT DETECTED Final    Comment: (NOTE) The GeneXpert MRSA Assay (FDA approved for NASAL specimens only), is one component of a comprehensive MRSA colonization surveillance program. It is not intended to diagnose MRSA infection nor to guide or monitor treatment for MRSA infections. Test performance is not FDA approved in patients less than 36 years old. Performed at [facility], [address], [place], [place] [address]      Labs: BNP (last 3 results) Recent Labs    04/03/23 1232 04/05/23 0407  BNP 777.0* 565.0*   Basic Metabolic Panel: Recent Labs  Lab 04/03/23 1232 04/04/23 0459 04/05/23 0407 04/07/23 0423 04/09/23 0442  NA 135 134* 136 133* 134*  K 3.7 3.8 3.6 3.8 3.8  CL 99 98 100 97* 99  CO2 25 26 24 27  24   GLUCOSE 103* 107* 107* 123* 112*  BUN 21 18 19 17 18   CREATININE 0.87 0.86 1.04* 1.00 1.12*  CALCIUM 8.9 8.9 8.9 8.9 8.8*  MG  --   --  1.7 1.9 1.8   Liver Function  Tests: No results for input(s): "AST", "ALT", "ALKPHOS", "BILITOT", "PROT", "ALBUMIN" in the last 168 hours. No results for input(s): "LIPASE", "AMYLASE" in the last 168 hours. No results for input(s): "AMMONIA" in the last 168 hours. CBC: Recent Labs  Lab 04/03/23 1232 04/04/23 0459 04/05/23 0407 04/09/23 0442  WBC 6.0 5.1 6.2 5.6  NEUTROABS 4.0  --   --   --   HGB 13.4 11.6* 12.3 12.1  HCT 41.0 35.4* 37.7 36.7  MCV 101.2* 101.1* 102.2* 100.0  PLT 227 182 209 191   Cardiac Enzymes: No results for input(s): "CKTOTAL", "CKMB", "CKMBINDEX", "TROPONINI" in the last 168 hours. BNP: Invalid input(s): "POCBNP" CBG: No results for input(s): "GLUCAP" in the last 168 hours. D-Dimer No results for input(s): "DDIMER" in the last 72 hours. Hgb A1c No results for input(s): "HGBA1C" in the last 72 hours. Lipid Profile No results for input(s): "CHOL", "HDL", "LDLCALC", "TRIG", "CHOLHDL", "LDLDIRECT" in the last 72 hours. Thyroid function studies No results for input(s): "TSH", "T4TOTAL", "T3FREE", "THYROIDAB" in the last 72 hours.  Invalid input(s): "FREET3" Anemia work up No results for input(s): "VITAMINB12", "FOLATE", "FERRITIN", "TIBC", "IRON", "RETICCTPCT" in the last 72 hours. Urinalysis    Component Value Date/Time   COLORURINE YELLOW 04/03/2023 1850   APPEARANCEUR CLEAR 04/03/2023 1850   APPEARANCEUR Clear 07/26/2021 1003   LABSPEC 1.020 04/03/2023 1850   PHURINE 5.0 04/03/2023 1850   GLUCOSEU NEGATIVE 04/03/2023 1850   HGBUR NEGATIVE 04/03/2023 1850   BILIRUBINUR NEGATIVE 04/03/2023 1850   BILIRUBINUR Negative 07/26/2021 1003   KETONESUR 5 (A) 04/03/2023 1850   PROTEINUR 30 (A) 04/03/2023 1850   NITRITE NEGATIVE 04/03/2023 1850   LEUKOCYTESUR TRACE (A) 04/03/2023 1850   Sepsis Labs Recent Labs   Lab 04/03/23 1232 04/04/23 0459 04/05/23 0407 04/09/23 0442  WBC 6.0 5.1 6.2 5.6   Microbiology Recent Results (from the past 240 hour(s))  SARS Coronavirus 2 by RT PCR (hospital order, performed in [facility] hospital lab) *cepheid single result test* Anterior Nasal Swab     Status: None   Collection Time: 04/03/23  4:50 PM   Specimen: Anterior Nasal Swab  Result Value Ref Range Status   SARS Coronavirus 2 by RT PCR NEGATIVE NEGATIVE Final    Comment: (NOTE) SARS-CoV-2 target nucleic acids are NOT DETECTED.  The SARS-CoV-2 RNA is generally detectable in upper and lower respiratory specimens during the acute phase of infection. The lowest concentration of SARS-CoV-2 viral copies this assay can detect is 250 copies / mL. A negative result does not preclude SARS-CoV-2 infection and should not be used as the sole basis for treatment or other patient management decisions.  A negative result may occur with improper specimen collection / handling, submission of specimen other than nasopharyngeal swab, presence of viral mutation(s) within the areas targeted by this assay, and inadequate number of viral copies (<250 copies / mL). A negative result must be combined with clinical observations, patient history, and epidemiological information.  Fact Sheet for Patients:   RoadLapTop.co.za  Fact Sheet for Healthcare Providers: http://kim-miller.com/  This test is not yet approved or  cleared by the [place] FDA and has been authorized for detection and/or diagnosis of SARS-CoV-2 by FDA under an Emergency Use Authorization (EUA).  This EUA will remain in effect (meaning this test can be used) for the duration of the COVID-19 declaration under Section 564(b)(1) of the Act, 21 U.S.C. section 360bbb-3(b)(1), unless the authorization is terminated or revoked sooner.  Performed at [facility], [phone]  [address] [place], [place] [address]    MRSA Next Gen by PCR, Nasal     Status: None   Collection Time: 04/03/23  8:45 PM   Specimen: Nasal Mucosa; Nasal Swab  Result Value Ref Range Status   MRSA by PCR Next Gen NOT DETECTED NOT DETECTED Final    Comment: (NOTE) The GeneXpert MRSA Assay (FDA approved for NASAL specimens only), is one component of a comprehensive MRSA colonization surveillance program. It is not intended to diagnose MRSA infection nor to guide or monitor treatment for MRSA infections. Test performance is not FDA approved in patients less than 32 years old. Performed at [facility], [address], [place], [place] [address]     Time coordinating discharge: 43 mins  SIGNED:  Standley Dakins, MD  [facility] 04/09/2023, 2:38 PM How to contact the [facility] Attending or Consulting provider 7A - 7P or covering provider during after hours 7P -7A, for this patient?  Check the care team in [facility] and look for a) attending/consulting [facility] provider listed and b) the [facility] team listed Log into [email or web address] and use [place]'s universal password to access. If you do not have the password, please contact the hospital operator. Locate the [facility] provider you are looking for under [facility] and page to a number that you can be directly reached. If you still have difficulty reaching the provider, please page the [facility] (Director on Call) for the Hospitalists listed on amion for assistance.

## 2023-04-09 NOTE — Progress Notes (Signed)
*  PRELIMINARY RESULTS* Echocardiogram Echocardiogram Transesophageal has been performed.  Brandi Rowe 04/09/2023, 9:55 AM

## 2023-04-09 NOTE — Progress Notes (Addendum)
Progress Note  Patient Name: Brandi Rowe Date of Encounter: 04/09/2023  Primary Cardiologist: Marjo Bicker, MD  Subjective   Reports ongoing SOB. No CP. HR 80s-1teens on telemetry. She reports that since Thursday she's had alternating issues with constipation and diarrhea. D/w Dr. Jenene Slicker who does not feel this is a contraindication to proceed with TEE DCCV as planned.  Inpatient Medications    Scheduled Meds:  apixaban  5 mg Oral BID   Chlorhexidine Gluconate Cloth  6 each Topical Daily   guaiFENesin  600 mg Oral BID   metoprolol tartrate  75 mg Oral BID   saccharomyces boulardii  250 mg Oral BID WC   cyanocobalamin  500 mcg Oral Daily   Continuous Infusions:  sodium chloride     amiodarone 30 mg/hr (04/09/23 0500)   PRN Meds: acetaminophen **OR** acetaminophen, albuterol, calcium carbonate, clonazepam, dextromethorphan, fluticasone, loperamide, ondansetron, polyethylene glycol   Vital Signs    Vitals:   04/09/23 0300 04/09/23 0400 04/09/23 0600 04/09/23 0741  BP: (!) 123/99 (!) 124/97 (!) 123/91   Pulse: 89 99 87   Resp: 18 14 19    Temp:  98.4 F (36.9 C)  98.3 F (36.8 C)  TempSrc:  Oral  Oral  SpO2: 95% 92% 96%   Weight:      Height:        Intake/Output Summary (Last 24 hours) at 04/09/2023 0811 Last data filed at 04/09/2023 0500 Gross per 24 hour  Intake 577.49 ml  Output 1150 ml  Net -572.51 ml      04/08/2023   10:56 AM 04/08/2023    5:00 AM 04/07/2023    8:00 AM  Last 3 Weights  Weight (lbs) 129 lb 6.6 oz 120 lb 13 oz 121 lb  Weight (kg) 58.7 kg 54.8 kg 54.885 kg     Telemetry    AF with continued RVR at times - Personally Reviewed  Physical Exam   GEN: No acute distress.  HEENT: Normocephalic, atraumatic, sclera non-icteric. Neck: No JVD or bruits. Cardiac: irregularly irregular, borderline achycardic, no murmurs, rubs, or gallops.  Respiratory: Clear to auscultation bilaterally. Breathing is unlabored. GI: Soft, nontender,  non-distended, BS +x 4. MS: no deformity. Extremities: No clubbing or cyanosis. No edema. Distal pedal pulses are 2+ and equal bilaterally. Neuro:  AAOx3. Follows commands. Psych:  Responds to questions appropriately with a normal affect.  Labs    High Sensitivity Troponin:   Recent Labs  Lab 04/03/23 1232 04/03/23 1413  TROPONINIHS 6 7      Cardiac EnzymesNo results for input(s): "TROPONINI" in the last 168 hours. No results for input(s): "TROPIPOC" in the last 168 hours.   Chemistry Recent Labs  Lab 04/05/23 0407 04/07/23 0423 04/09/23 0442  NA 136 133* 134*  K 3.6 3.8 3.8  CL 100 97* 99  CO2 24 27 24   GLUCOSE 107* 123* 112*  BUN 19 17 18   CREATININE 1.04* 1.00 1.12*  CALCIUM 8.9 8.9 8.8*  GFRNONAA 59* >60 54*  ANIONGAP 12 9 11      Hematology Recent Labs  Lab 04/04/23 0459 04/05/23 0407 04/09/23 0442  WBC 5.1 6.2 5.6  RBC 3.50* 3.69* 3.67*  HGB 11.6* 12.3 12.1  HCT 35.4* 37.7 36.7  MCV 101.1* 102.2* 100.0  MCH 33.1 33.3 33.0  MCHC 32.8 32.6 33.0  RDW 14.4 14.4 13.9  PLT 182 209 191    BNP Recent Labs  Lab 04/03/23 1232 04/05/23 0407  BNP 777.0* 565.0*  DDimer No results for input(s): "DDIMER" in the last 168 hours.   Radiology    No results found.  Cardiac Studies   2D echo 04/06/23    1. Left ventricular ejection fraction, by estimation, is 45 to 50%. The  left ventricle has mildly decreased function. The left ventricle  demonstrates global hypokinesis. Left ventricular diastolic parameters are  indeterminate.   2. Right ventricular systolic function is normal. The right ventricular  size is normal. There is mildly elevated pulmonary artery systolic  pressure. The estimated right ventricular systolic pressure is 43.9 mmHg.   3. Left atrial size was moderately dilated.   4. Right atrial size was moderately dilated.   5. A small pericardial effusion is present. The pericardial effusion is  posterior to the left ventricle.   6. The  mitral valve is grossly normal. Mild mitral valve regurgitation.   7. Tricuspid valve regurgitation is moderate.   8. The aortic valve is tricuspid. Aortic valve regurgitation is not  visualized.   9. The inferior vena cava is dilated in size with <50% respiratory  variability, suggesting right atrial pressure of 15 mmHg.    Patient Profile     67 y.o. 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 She was admitted with worsening LE edema, SOB, fatigue and palpitations, found to be back in atrial fibrillation with RVR. CXR showed question of RML PNA. 2D echo showed decline in EF to 45-50%. Was intended to be on Eliquis as an outpatient but she just started about 3 weeks ago and was reported to be only taking once a day.  Assessment & Plan    1. Persistent atrial fibrillation with RVR - patient was trialed on BB and oral amiodarone but still had issues with RVR yesterday prompting Dr. Antoine Poche transition to IV amiodarone - Dr. Jenene Slicker recommends TEE/DCCV today as previously suggested - continue Eliquis 5mg  BID - TSH wnl - med plan TBD post DCCV  Informed Consent   Shared Decision Making/Informed Consent The risks [stroke, cardiac arrhythmias rarely resulting in the need for a temporary or permanent pacemaker, skin irritation or burns, esophageal damage, perforation (1:10,000 risk), bleeding, pharyngeal hematoma as well as other potential complications associated with conscious sedation including aspiration, arrhythmia, respiratory failure and death], benefits (treatment guidance, restoration of normal sinus rhythm, diagnostic support) and alternatives of a transesophageal echocardiogram guided cardioversion were discussed in detail  with Ms. Studley and she is willing to proceed.    2. 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 HTN - BP soft at times earlier this admission, presently stable  4. Small pericardial effusion, mild MR - no acute intervention needed, follow clinically  5. CAP - received antibiotics, per primary team  6. Constipation alternating with diarrhea s/p prior bowel surgery - per primary team  F/u scheduled 10/8 with APP. MD recs to follow  For questions or updates, please contact [facility] Please consult [email or web address] for contact info under Cardiology/STEMI.  Signed, Laurann Montana, PA-C 04/09/2023, 8:11 AM       Attending attestation N.p.o. today for TEE guided DCCV.  Telemetry reviewed, in  atrial fibrillation, heart rate ranged between 90 and 110.  Currently on amiodarone drip, metoprolol tartrate and Eliquis 5 mg twice daily.  No symptoms.  Examination remarkable for patient not in distress, HEENT normal, no JVD, S1-S2 normal, clear lungs, abdomen NTND, AOx3.  Follow-up on the CV procedure note after TEE guided DCCV.   Rotha Cassels Verne Spurr, MD [place]  [facility]  10:46 AM

## 2023-04-09 NOTE — CV Procedure (Signed)
TRANSESOPHAGEAL ECHOCARDIOGRAM GUIDED DIRECT CURRENT CARDIOVERSION  NAME:  Brandi Rowe    MRN: [id number] DOB:  1955-11-02    ADMIT DATE: 04/03/2023  INDICATIONS: Afib with RVR Tachycardia induced cardiomyopathy  PROCEDURE:  Informed consent for TEE guided DCCV was obtained prior to procedure. After a procedural time-out, the oropharynx was anesthetized and the patient was sedated by the anesthesia service. The transesophageal probe was inserted in the esophagus and stomach without difficulty and multiple views were obtained. Once the TEE was complete, the patient had the defibrillator pads placed in the anterior and posterior position. Once an appropriate level of sedation was confirmed, the patient was cardioverted x 1 with 120J of biphasic synchronized energy.  The patient converted to NSR.  There were no apparent complications.  The patient had normal neuro status and respiratory status post procedure with vitals stable as recorded elsewhere.  Adequate airway was maintained throughout and vital signs monitored per protocol.  FINDINGS: No LAA thrombus. Successful conversion to NSR with 120 J x 1 synchronized defibrillation. Post DCCV EKG showed sinus bradycardia.  RECOMMENDATIONS: Stop Amiodarone drip and start Amiodarone 200 mg once daily. Switch metoprolol tartrate dose from 75 mg to 25 mg twice daily as needed for palpitations or HR>100 bpm.  Continue Eliquis 5 mg twice daily.  Okay to get discharged from cardiology standpoint.Obtain limited echocardiogram for LVEF assessment in 3 weeks.  Follow-up in cardiology clinic in 1 month after discharge.       Graciela Plato Verne Spurr, MD [place]  [facility]  10:40 AM

## 2023-04-09 NOTE — Anesthesia Procedure Notes (Signed)
Date/Time: 04/09/2023 8:51 AM  Performed by: Julian Reil, CRNAPre-anesthesia Checklist: Patient identified, Emergency Drugs available, Suction available and Patient being monitored Patient Re-evaluated:Patient Re-evaluated prior to induction Oxygen Delivery Method: Nasal cannula Induction Type: IV induction Placement Confirmation: positive ETCO2

## 2023-04-09 NOTE — Discharge Instructions (Signed)
IMPORTANT INFORMATION: PAY CLOSE ATTENTION   PHYSICIAN DISCHARGE INSTRUCTIONS  Follow with Primary care provider  Brandi Stabile, MD  and other consultants as instructed by your Hospitalist Physician  SEEK MEDICAL CARE OR RETURN TO EMERGENCY ROOM IF SYMPTOMS COME BACK, WORSEN OR NEW PROBLEM DEVELOPS   Please note: You were cared for by a hospitalist during your hospital stay. Every effort will be made to forward records to your primary care provider.  You can request that your primary care provider send for your hospital records if they have not received them.  Once you are discharged, your primary care physician will handle any further medical issues. Please note that NO REFILLS for any discharge medications will be authorized once you are discharged, as it is imperative that you return to your primary care physician (or establish a relationship with a primary care physician if you do not have one) for your post hospital discharge needs so that they can reassess your need for medications and monitor your lab values.  Please get a complete blood count and chemistry panel checked by your Primary MD at your next visit, and again as instructed by your Primary MD.  Get Medicines reviewed and adjusted: Please take all your medications with you for your next visit with your Primary MD  Laboratory/radiological data: Please request your Primary MD to go over all hospital tests and procedure/radiological results at the follow up, please ask your primary care provider to get all Hospital records sent to his/her office.  In some cases, they will be blood work, cultures and biopsy results pending at the time of your discharge. Please request that your primary care provider follow up on these results.  If you are diabetic, please bring your blood sugar readings with you to your follow up appointment with primary care.    Please call and make your follow up appointments as soon as possible.    Also Note the  following: If you experience worsening of your admission symptoms, develop shortness of breath, life threatening emergency, suicidal or homicidal thoughts you must seek medical attention immediately by calling 911 or calling your MD immediately  if symptoms less severe.  You must read complete instructions/literature along with all the possible adverse reactions/side effects for all the Medicines you take and that have been prescribed to you. Take any new Medicines after you have completely understood and accpet all the possible adverse reactions/side effects.   Do not drive when taking Pain medications or sleeping medications (Benzodiazepines)  Do not take more than prescribed Pain, Sleep and Anxiety Medications. It is not advisable to combine anxiety,sleep and pain medications without talking with your primary care practitioner  Special Instructions: If you have smoked or chewed Tobacco  in the last 2 yrs please stop smoking, stop any regular Alcohol  and or any Recreational drug use.  Wear Seat belts while driving.  Do not drive if taking any narcotic, mind altering or controlled substances or recreational drugs or alcohol.

## 2023-04-09 NOTE — Anesthesia Preprocedure Evaluation (Signed)
Anesthesia Evaluation  Patient identified by MRN, date of birth, ID band Patient awake    Reviewed: Allergy & Precautions, H&P , NPO status , Patient's Chart, lab work & pertinent test results, reviewed documented beta blocker date and time   Airway Mallampati: II  TM Distance: >3 FB Neck ROM: full    Dental no notable dental hx.    Pulmonary neg pulmonary ROS, pneumonia, Current Smoker and Patient abstained from smoking.   Pulmonary exam normal breath sounds clear to auscultation       Cardiovascular Exercise Tolerance: Good hypertension, + dysrhythmias Atrial Fibrillation  Rhythm:regular Rate:Normal     Neuro/Psych  PSYCHIATRIC DISORDERS Anxiety     negative neurological ROS  negative psych ROS   GI/Hepatic negative GI ROS, Neg liver ROS,,,  Endo/Other  negative endocrine ROS    Renal/GU Renal diseasenegative Renal ROS  negative genitourinary   Musculoskeletal   Abdominal   Peds  Hematology negative hematology ROS (+) Blood dyscrasia, anemia   Anesthesia Other Findings   Reproductive/Obstetrics negative OB ROS                             Anesthesia Physical Anesthesia Plan  ASA: 3 and emergent  Anesthesia Plan: General   Post-op Pain Management:    Induction:   PONV Risk Score and Plan: Propofol infusion  Airway Management Planned:   Additional Equipment:   Intra-op Plan:   Post-operative Plan:   Informed Consent: I have reviewed the patients History and Physical, chart, labs and discussed the procedure including the risks, benefits and alternatives for the proposed anesthesia with the patient or authorized representative who has indicated his/her understanding and acceptance.     Dental Advisory Given  Plan Discussed with: CRNA  Anesthesia Plan Comments:        Anesthesia Quick Evaluation

## 2023-04-09 NOTE — H&P (View-Only) (Signed)
Progress Note  Patient Name: Brandi Rowe Date of Encounter: 04/09/2023  Primary Cardiologist: Brandi Bicker, MD  Subjective   Reports ongoing SOB. No CP. HR 80s-1teens on telemetry. She reports that since Thursday she's had alternating issues with constipation and diarrhea. D/w Dr. Jenene Rowe who does not feel this is a contraindication to proceed with TEE DCCV as planned.  Inpatient Medications    Scheduled Meds:  apixaban  5 mg Oral BID   Chlorhexidine Gluconate Cloth  6 each Topical Daily   guaiFENesin  600 mg Oral BID   metoprolol tartrate  75 mg Oral BID   saccharomyces boulardii  250 mg Oral BID WC   cyanocobalamin  500 mcg Oral Daily   Continuous Infusions:  sodium chloride     amiodarone 30 mg/hr (04/09/23 0500)   PRN Meds: acetaminophen **OR** acetaminophen, albuterol, calcium carbonate, clonazepam, dextromethorphan, fluticasone, loperamide, ondansetron, polyethylene glycol   Vital Signs    Vitals:   04/09/23 0300 04/09/23 0400 04/09/23 0600 04/09/23 0741  BP: (!) 123/99 (!) 124/97 (!) 123/91   Pulse: 89 99 87   Resp: 18 14 19    Temp:  98.4 F (36.9 C)  98.3 F (36.8 C)  TempSrc:  Oral  Oral  SpO2: 95% 92% 96%   Weight:      Height:        Intake/Output Summary (Last 24 hours) at 04/09/2023 0811 Last data filed at 04/09/2023 0500 Gross per 24 hour  Intake 577.49 ml  Output 1150 ml  Net -572.51 ml      04/08/2023   10:56 AM 04/08/2023    5:00 AM 04/07/2023    8:00 AM  Last 3 Weights  Weight (lbs) 129 lb 6.6 oz 120 lb 13 oz 121 lb  Weight (kg) 58.7 kg 54.8 kg 54.885 kg     Telemetry    AF with continued RVR at times - Personally Reviewed  Physical Exam   GEN: No acute distress.  HEENT: Normocephalic, atraumatic, sclera non-icteric. Neck: No JVD or bruits. Cardiac: irregularly irregular, borderline achycardic, no murmurs, rubs, or gallops.  Respiratory: Clear to auscultation bilaterally. Breathing is unlabored. GI: Soft, nontender,  non-distended, BS +x 4. MS: no deformity. Extremities: No clubbing or cyanosis. No edema. Distal pedal pulses are 2+ and equal bilaterally. Neuro:  AAOx3. Follows commands. Psych:  Responds to questions appropriately with a normal affect.  Labs    High Sensitivity Troponin:   Recent Labs  Lab 04/03/23 1232 04/03/23 1413  TROPONINIHS 6 7      Cardiac EnzymesNo results for input(s): "TROPONINI" in the last 168 hours. No results for input(s): "TROPIPOC" in the last 168 hours.   Chemistry Recent Labs  Lab 04/05/23 0407 04/07/23 0423 04/09/23 0442  NA 136 133* 134*  K 3.6 3.8 3.8  CL 100 97* 99  CO2 24 27 24   GLUCOSE 107* 123* 112*  BUN 19 17 18   CREATININE 1.04* 1.00 1.12*  CALCIUM 8.9 8.9 8.8*  GFRNONAA 59* >60 54*  ANIONGAP 12 9 11      Hematology Recent Labs  Lab 04/04/23 0459 04/05/23 0407 04/09/23 0442  WBC 5.1 6.2 5.6  RBC 3.50* 3.69* 3.67*  HGB 11.6* 12.3 12.1  HCT 35.4* 37.7 36.7  MCV 101.1* 102.2* 100.0  MCH 33.1 33.3 33.0  MCHC 32.8 32.6 33.0  RDW 14.4 14.4 13.9  PLT 182 209 191    BNP Recent Labs  Lab 04/03/23 1232 04/05/23 0407  BNP 777.0* 565.0*  DDimer No results for input(s): "DDIMER" in the last 168 hours.   Radiology    No results found.  Cardiac Studies   2D echo 04/06/23    1. Left ventricular ejection fraction, by estimation, is 45 to 50%. The  left ventricle has mildly decreased function. The left ventricle  demonstrates global hypokinesis. Left ventricular diastolic parameters are  indeterminate.   2. Right ventricular systolic function is normal. The right ventricular  size is normal. There is mildly elevated pulmonary artery systolic  pressure. The estimated right ventricular systolic pressure is 43.9 mmHg.   3. Left atrial size was moderately dilated.   4. Right atrial size was moderately dilated.   5. A small pericardial effusion is present. The pericardial effusion is  posterior to the left ventricle.   6. The  mitral valve is grossly normal. Mild mitral valve regurgitation.   7. Tricuspid valve regurgitation is moderate.   8. The aortic valve is tricuspid. Aortic valve regurgitation is not  visualized.   9. The inferior vena cava is dilated in size with <50% respiratory  variability, suggesting right atrial pressure of 15 mmHg.    Patient Profile     67 y.o. 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 She was admitted with worsening LE edema, SOB, fatigue and palpitations, found to be back in atrial fibrillation with RVR. CXR showed question of RML PNA. 2D echo showed decline in EF to 45-50%. Was intended to be on Eliquis as an outpatient but she just started about 3 weeks ago and was reported to be only taking once a day.  Assessment & Plan    1. Persistent atrial fibrillation with RVR - patient was trialed on BB and oral amiodarone but still had issues with RVR yesterday prompting Dr. Antoine Rowe transition to IV amiodarone - Dr. Jenene Rowe recommends TEE/DCCV today as previously suggested - continue Eliquis 5mg  BID - TSH wnl - med plan TBD post DCCV  Informed Consent   Shared Decision Making/Informed Consent The risks [stroke, cardiac arrhythmias rarely resulting in the need for a temporary or permanent pacemaker, skin irritation or burns, esophageal damage, perforation (1:10,000 risk), bleeding, pharyngeal hematoma as well as other potential complications associated with conscious sedation including aspiration, arrhythmia, respiratory failure and death], benefits (treatment guidance, restoration of normal sinus rhythm, diagnostic support) and alternatives of a transesophageal echocardiogram guided cardioversion were discussed in detail  with Brandi Rowe and she is willing to proceed.    2. 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 HTN - BP soft at times earlier this admission, presently stable  4. Small pericardial effusion, mild MR - no acute intervention needed, follow clinically  5. CAP - received antibiotics, per primary team  6. Constipation alternating with diarrhea s/p prior bowel surgery - per primary team  F/u scheduled 10/8 with APP. MD recs to follow  For questions or updates, please contact [facility] Please consult [email or web address] for contact info under Cardiology/STEMI.  Signed, Laurann Montana, PA-C 04/09/2023, 8:11 AM

## 2023-04-09 NOTE — Progress Notes (Signed)
Electrical Cardioversion Procedure Note HAZYL OSTEEN [id number] Jul 12, 1956  Procedure: Electrical Cardioversion Indications:  Atrial Fibrillation  Procedure Details Consent: Risks of procedure as well as the alternatives and risks of each were explained to the (patient/caregiver).  Consent for procedure obtained. Time Out: Verified patient identification, verified procedure, site/side was marked, verified correct patient position, special equipment/implants available, medications/allergies/relevent history reviewed, required imaging and test results available.  time out performed 0852  Patient placed on cardiac monitor, pulse oximetry, supplemental oxygen as necessary.  Sedation given:  propofol Pacer pads placed anterior and posterior chest.  Cardioverted 1 time(s).  Cardioverted at 125 J.  Evaluation Findings: Post procedure EKG shows:  Sinus bradycardia Complications: None Patient did tolerate procedure well.   Carole Civil 04/09/2023, 9:51 AM

## 2023-04-09 NOTE — Transfer of Care (Signed)
Immediate Anesthesia Transfer of Care Note  Patient: Brandi Rowe  Procedure(s) Performed: CARDIOVERSION TRANSESOPHAGEAL ECHOCARDIOGRAM (TEE)  Patient Location: PACU  Anesthesia Type:General  Level of Consciousness: drowsy  Airway & Oxygen Therapy: Patient Spontanous Breathing and Patient connected to nasal cannula oxygen  Post-op Assessment: Report given to RN and Post -op Vital signs reviewed and stable  Post vital signs: Reviewed and stable  Last Vitals:  Vitals Value Taken Time  BP    Temp    Pulse    Resp    SpO2      Last Pain:  Vitals:   04/09/23 0741  TempSrc: Oral  PainSc:       Patients Stated Pain Goal: 0 (04/06/23 0800)  Complications: No notable events documented.

## 2023-04-11 ENCOUNTER — Other Ambulatory Visit: Payer: Self-pay

## 2023-04-11 MED ORDER — FUROSEMIDE 20 MG PO TABS: 20.0000 mg | ORAL_TABLET | Freq: Every day | ORAL | 3 refills | Status: DC | PRN

## 2023-04-16 NOTE — Anesthesia Postprocedure Evaluation (Signed)
Anesthesia Post Note  Patient: Brandi Rowe  Procedure(s) Performed: CARDIOVERSION TRANSESOPHAGEAL ECHOCARDIOGRAM (TEE)  Patient location during evaluation: Phase II Anesthesia Type: General Level of consciousness: awake Pain management: pain level controlled Vital Signs Assessment: post-procedure vital signs reviewed and stable Respiratory status: spontaneous breathing and respiratory function stable Cardiovascular status: blood pressure returned to baseline and stable Postop Assessment: no headache and no apparent nausea or vomiting Anesthetic complications: no Comments: Late entry   No notable events documented.   Last Vitals:  Vitals:   04/09/23 1200 04/09/23 1300  BP: (!) 143/96 (!) 152/105  Pulse: (!) 53 61  Resp: (!) 21 (!) 23  Temp:    SpO2: 98% 97%    Last Pain:  Vitals:   04/09/23 1120  TempSrc: Oral  PainSc:                  Windell [place]

## 2023-04-30 ENCOUNTER — Encounter: Payer: Self-pay | Admitting: Student

## 2023-04-30 ENCOUNTER — Ambulatory Visit: Payer: Medicare Other | Attending: Student | Admitting: Student

## 2023-04-30 VITALS — BP 138/82 | HR 73 | Ht 64.0 in | Wt 111.0 lb

## 2023-04-30 DIAGNOSIS — Z7901 Long term (current) use of anticoagulants: Secondary | ICD-10-CM

## 2023-04-30 DIAGNOSIS — I3139 Other pericardial effusion (noninflammatory): Secondary | ICD-10-CM | POA: Diagnosis not present

## 2023-04-30 DIAGNOSIS — I4819 Other persistent atrial fibrillation: Secondary | ICD-10-CM | POA: Diagnosis not present

## 2023-04-30 DIAGNOSIS — I5022 Chronic systolic (congestive) heart failure: Secondary | ICD-10-CM | POA: Diagnosis not present

## 2023-04-30 DIAGNOSIS — Z23 Encounter for immunization: Secondary | ICD-10-CM | POA: Diagnosis not present

## 2023-04-30 NOTE — Progress Notes (Signed)
Cardiology Office Note    Date:  04/30/2023  ID:  Brandi Rowe, DOB Nov 06, 1955, MRN [id number] Cardiologist: Marjo Bicker, MD    History of Present Illness:    Brandi Rowe is a 67 y.o. female with past medical history of paroxysmal atrial fibrillation (diagnosed in 2022 in the setting of SBO and electrolyte abnormalities at that time with outpatient monitor showing no recurrent atrial fibrillation), diverticulitis (s/p partial colectomy with colostomy) and tobacco use who presents to the office today for hospital follow-up.  She most recently presented to [facility] ED last month for evaluation of worsening shortness of breath, palpitations and lower extremity edema for the past week. She was found to be in atrial fibrillation with RVR upon arrival and CXR was also concerning for pneumonia and she was started on antibiotic therapy for this. She was initially started on IV Cardizem and this was transitioned to Lopressor 50 mg twice daily and also required initiation of Amiodarone. Given persistent episodes of atrial fibrillation with RVR during admission, she ultimately underwent TEE/DCCV on 04/09/2023. She did convert back to normal sinus rhythm and she was switched to Amiodarone 200 mg daily and Lopressor was reduced to 25 mg twice daily PRN with her being continued on Eliquis 5 mg twice daily for anticoagulation. It was recommended to obtain a follow-up limited echocardiogram in 3 weeks for reassessment of her EF as this was reduced at 40 to 45% by TEE.  She did send MyChart messages in the interim reporting lower extremity edema and was encouraged to limit her sodium intake and limit fluid intake to 2 L/day. She was sent in Rx for PRN Lasix 20mg .  In talking with the patient and her husband today, she reports feeling the best she has felt in years.  She denies any chest pain or palpitations. No recent dyspnea on exertion, orthopnea or PND. Reports her lower extremity edema has  significantly improved and she has not needed Lasix for the past few weeks. She remains on Eliquis 5 mg twice daily with no reports of active bleeding. She is concerned about the possible side effects of Amiodarone and we reviewed this will likely be a short-term medication for her.  Studies Reviewed:   EKG: EKG is ordered today and demonstrates:   EKG Interpretation Date/Time:  Tuesday April 30 2023 15:27:23 EDT Ventricular Rate:  73 PR Interval:  156 QRS Duration:  76 QT Interval:  406 QTC Calculation: 447 R Axis:   114  Text Interpretation: Normal sinus rhythm Left posterior fascicular block No acute ST changes Confirmed by Randall An ([id number]) on 04/30/2023 5:00:11 PM       Echocardiogram: 03/2023 IMPRESSIONS     1. Left ventricular ejection fraction, by estimation, is 45 to 50%. The  left ventricle has mildly decreased function. The left ventricle  demonstrates global hypokinesis. Left ventricular diastolic parameters are  indeterminate.   2. Right ventricular systolic function is normal. The right ventricular  size is normal. There is mildly elevated pulmonary artery systolic  pressure. The estimated right ventricular systolic pressure is 43.9 mmHg.   3. Left atrial size was moderately dilated.   4. Right atrial size was moderately dilated.   5. A small pericardial effusion is present. The pericardial effusion is  posterior to the left ventricle.   6. The mitral valve is grossly normal. Mild mitral valve regurgitation.   7. Tricuspid valve regurgitation is moderate.   8. The aortic valve is tricuspid. Aortic valve regurgitation  is not  visualized.   9. The inferior vena cava is dilated in size with <50% respiratory  variability, suggesting right atrial pressure of 15 mmHg.   Comparison(s): Prior images reviewed side by side. LVEF mildly reduced at  45-50% in the setting of atrial fibrillation. Mildly elevated estimated  RVSP. Mild mitral and moderate tricuspid  regurgitation.    Risk Assessment/Calculations:    CHA2DS2-VASc Score = 3   This indicates a 3.2% annual risk of stroke. The patient's score is based upon: CHF History: 0 HTN History: 1 Diabetes History: 0 Stroke History: 0 Vascular Disease History: 0 Age Score: 1 Gender Score: 1     Physical Exam:   VS:  BP 138/82   Pulse 73   Ht 5\' 4"  (1.626 m)   Wt 111 lb (50.3 kg)   SpO2 97%   BMI 19.05 kg/m    Wt Readings from Last 3 Encounters:  04/30/23 111 lb (50.3 kg)  04/08/23 129 lb 6.6 oz (58.7 kg)  09/21/22 18 lb (8.165 kg)     GEN: Well nourished, well developed female appearing in no acute distress NECK: No JVD; No carotid bruits CARDIAC: RRR, no murmurs, rubs, gallops RESPIRATORY:  Clear to auscultation without rales, wheezing or rhonchi  ABDOMEN: Appears non-distended. No obvious abdominal masses. EXTREMITIES: No clubbing or cyanosis. No pitting edema.  Distal pedal pulses are 2+ bilaterally.   Assessment and Plan:   1. Persistent Atrial Fibrillation/Use of Long-term Anticoagulation - She denies any recent palpitations and is in normal sinus rhythm by examination and EKG today.  She remains on Amiodarone 200 mg daily and I will review with Dr. Jenene Slicker to see how long of a course she wants her to complete of this. If for more than 3 months, would need to obtain follow-up LFT's and TSH. - No reports of active bleeding. Continue Eliquis 5 mg twice daily for anticoagulation which is the appropriate dose at this time given her age, weight and renal function (creatinine 1.12 on 04/09/2023).  2. Acute HFmrEF - Her EF was at 45 to 50% by TTE and 40 to 45% by TEE during recent admission. Likely tachycardia mediated in the setting of atrial fibrillation with RVR. She did have some issues with lower extremity edema following hospital discharge but this has now resolved. Will plan to obtain a follow-up limited echocardiogram for reassessment of her EF and to reassess her  pericardial effusion as recommended during admission. Given [place]24/2023, we will try to have this obtained by then in case she requires initiation of GDMT.  3. Pericardial Effusion - This was small by echocardiogram in 03/2023. Will plan for repeat imaging as outlined above.   Signed, Ellsworth Lennox, PA-C

## 2023-04-30 NOTE — Patient Instructions (Signed)
Medication Instructions:  Your physician recommends that you continue on your current medications as directed. Please refer to the Current Medication list given to you today.  *If you need a refill on your cardiac medications before your next appointment, please call your pharmacy*   Lab Work: NONE   If you have labs (blood work) drawn today and your tests are completely normal, you will receive your results only by: MyChart Message (if you have MyChart) OR A paper copy in the mail If you have any lab test that is abnormal or we need to change your treatment, we will call you to review the results.   Testing/Procedures: Your physician has requested that you have an echocardiogram. Echocardiography is a painless test that uses sound waves to create images of your heart. It provides your doctor with information about the size and shape of your heart and how well your heart's chambers and valves are working. This procedure takes approximately one hour. There are no restrictions for this procedure. Please do NOT wear cologne, perfume, aftershave, or lotions (deodorant is allowed). Please arrive 15 minutes prior to your appointment time.    Follow-Up: At [facility], you and your health needs are our priority.  As part of our continuing mission to provide you with exceptional heart care, we have created designated Provider Care Teams.  These Care Teams include your primary Cardiologist (physician) and Advanced Practice Providers (APPs -  Physician Assistants and Nurse Practitioners) who all work together to provide you with the care you need, when you need it.  We recommend signing up for the patient portal called "MyChart".  Sign up information is provided on this After Visit Summary.  MyChart is used to connect with patients for Virtual Visits (Telemedicine).  Patients are able to view lab/test results, encounter notes, upcoming appointments, etc.  Non-urgent messages can be sent to  your provider as well.   To learn more about what you can do with MyChart, go to [email or web address].    Your next appointment:   3 month(s)  Provider:   Luane School, MD or Randall An, PA-C    Other Instructions Thank you for choosing  [facility]!

## 2023-05-02 MED ORDER — AMIODARONE HCL 200 MG PO TABS: 200.0000 mg | ORAL_TABLET | Freq: Every day | ORAL | 0 refills | Status: DC

## 2023-05-14 ENCOUNTER — Ambulatory Visit: Payer: Medicare Other | Attending: Student

## 2023-05-14 DIAGNOSIS — I5022 Chronic systolic (congestive) heart failure: Secondary | ICD-10-CM

## 2023-05-14 LAB — ECHOCARDIOGRAM LIMITED
Area-P 1/2: 3.56 cm2
Est EF: 55
S' Lateral: 3.6 cm
Single Plane A4C EF: 50 %

## 2023-07-12 ENCOUNTER — Ambulatory Visit: Payer: Medicare Other | Attending: Cardiology | Admitting: *Deleted

## 2023-07-12 DIAGNOSIS — I480 Paroxysmal atrial fibrillation: Secondary | ICD-10-CM

## 2023-07-12 NOTE — Progress Notes (Signed)
Pt in office for EKG

## 2023-07-18 ENCOUNTER — Encounter (HOSPITAL_COMMUNITY): Payer: Self-pay | Admitting: Internal Medicine

## 2023-08-19 ENCOUNTER — Ambulatory Visit: Payer: Medicare Other | Attending: Internal Medicine | Admitting: Internal Medicine

## 2023-08-19 ENCOUNTER — Encounter: Payer: Self-pay | Admitting: Internal Medicine

## 2023-08-19 VITALS — BP 128/102 | HR 67 | Ht 64.0 in | Wt 120.4 lb

## 2023-08-19 DIAGNOSIS — I5032 Chronic diastolic (congestive) heart failure: Secondary | ICD-10-CM | POA: Diagnosis not present

## 2023-08-19 DIAGNOSIS — Z79899 Other long term (current) drug therapy: Secondary | ICD-10-CM | POA: Diagnosis not present

## 2023-08-19 NOTE — Patient Instructions (Signed)
Medication Instructions:  Your physician recommends that you continue on your current medications as directed. Please refer to the Current Medication list given to you today.  *If you need a refill on your cardiac medications before your next appointment, please call your pharmacy*   Lab Work: Your physician recommends that you return for lab work in: Today   If you have labs (blood work) drawn today and your tests are completely normal, you will receive your results only by: MyChart Message (if you have MyChart) OR A paper copy in the mail If you have any lab test that is abnormal or we need to change your treatment, we will call you to review the results.   Testing/Procedures: NONE    Follow-Up: At [facility], you and your health needs are our priority.  As part of our continuing mission to provide you with exceptional heart care, we have created designated Provider Care Teams.  These Care Teams include your primary Cardiologist (physician) and Advanced Practice Providers (APPs -  Physician Assistants and Nurse Practitioners) who all work together to provide you with the care you need, when you need it.  We recommend signing up for the patient portal called "MyChart".  Sign up information is provided on this After Visit Summary.  MyChart is used to connect with patients for Virtual Visits (Telemedicine).  Patients are able to view lab/test results, encounter notes, upcoming appointments, etc.  Non-urgent messages can be sent to your provider as well.   To learn more about what you can do with MyChart, go to [email or web address].    Your next appointment:   8 month(s)  Provider:   You may see Vishnu P Mallipeddi, MD or one of the following Advanced Practice Providers on your designated Care Team:   Randall An, PA-C  Jacolyn Reedy, PA-C     Other Instructions Thank you for choosing [facility]!

## 2023-08-19 NOTE — Progress Notes (Signed)
Cardiology Office Note  Date: 08/19/2023   ID: Brandi Rowe, DOB 01-Mar-1956, MRN [id number]  PCP:  Benita Stabile, MD  Cardiologist:  Marjo Bicker, MD Electrophysiologist:  None    History of Present Illness: Brandi Rowe is a 68 y.o. 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  Patient initially was diagnosed with new onset atrial fibrillation in 2022 in the setting of SBO with electrolyte abnormalities. Anticoagulation was not recommended at that time due to recent surgery and the fact that she has an ostomy which needed to be reversed. She later underwent 2-week event monitor in 08/2021 which showed numerous short runs of SVT and a 4 beat run of NSVT, but no A-fib. Metoprolol was increased to 50 mg twice daily.  However, she was admitted to [facility] in 05/2022 with dehydration and AKI when the metoprolol dose was decreased from 50 to 12.5 mg twice daily.  She had recent hospitalization in September 2024 for A-fib with RVR where she underwent TEE guided DCCV with successful conversion to NSR.  Echocardiogram performed at that time showed LVEF mildly reduced but repeat echocardiogram showed normal LVEF.  She is here today for follow-up visit.  Noted with palpitations.  She has been losing her hair for the last 2 months.  Amiodarone was discontinued last month.  No other symptoms of chest pain, DOE, syncope, dizziness or leg swelling.  She did endorse slight weight gain.   Past Medical History:  Diagnosis Date   Acute febrile illness 01/04/2021   Anxiety    Colovaginal fistula    Colovesical fistula    a. 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   Dehydration 01/04/2021   Elevated MCV 01/04/2021   Gout    Hypertension    Nausea 01/04/2021   PAF (paroxysmal atrial fibrillation) (HCC)    a. 06/2021 in setting of SBO,  HypoK/Mg; b. 06/2021 Echo: EF 50-55%, no rwma, GrI DD, Nl RV size/fxn, RVSP 36.56mmHg. Mod dil RA. Triv MR.   Sepsis (HCC) 01/05/2021   Tobacco abuse     Past Surgical History:  Procedure Laterality Date   BLADDER REPAIR N/A 05/17/2021   Procedure: Closure of Cystotomy;  Surgeon: Malen Gauze, MD;  Location: [facility];  Service: Urology;  Laterality: N/A;   CARDIOVERSION N/A 04/09/2023   Procedure: CARDIOVERSION;  Surgeon: Marjo Bicker, MD;  Location: [facility];  Service: Cardiovascular;  Laterality: N/A;   CARPAL TUNNEL RELEASE Right 2002   COLONOSCOPY WITH PROPOFOL N/A 09/19/2021   Procedure: COLONOSCOPY WITH PROPOFOL;  Surgeon: Franky Macho, MD;  Location: [facility];  Service: Gastroenterology;  Laterality: N/A;   COLOSTOMY REVERSAL N/A 09/20/2021   Procedure: Small Bowel Resection;  Surgeon: Franky Macho, MD;  Location: [facility];  Service: General;  Laterality: N/A;   MANDIBLE SURGERY  1985   PARTIAL COLECTOMY N/A 05/17/2021   Procedure: PARTIAL COLECTOMY WITH COLOSTOMY;  Surgeon: Franky Macho, MD;  Location: [facility];  Service: General;  Laterality: N/A;   PELVIC LYMPH NODE DISSECTION N/A 05/17/2021   Procedure: PELVIC DISSECTION;  Surgeon: Lazaro Arms, MD;  Location: [facility];  Service: Gynecology;  Laterality: N/A;   TEE WITHOUT CARDIOVERSION N/A 04/09/2023   Procedure: TRANSESOPHAGEAL ECHOCARDIOGRAM (TEE);  Surgeon: Marjo Bicker, MD;  Location: [facility];  Service: Cardiovascular;  Laterality: N/A;    Current Outpatient Medications  Medication Sig Dispense Refill  Multiple Vitamin (MULTIVITAMIN) tablet Take 1 tablet by mouth daily.     acetaminophen (TYLENOL) 500 MG tablet Take 500 mg by mouth 2 (two) times daily as needed for moderate pain or headache.     apixaban (ELIQUIS) 5 MG TABS tablet Take 1 tablet (5 mg total) by mouth 2 (two) times daily. 60 tablet 11   calcium carbonate (TUMS - DOSED IN MG ELEMENTAL CALCIUM) 500 MG chewable tablet Chew 1 tablet by mouth  daily as needed for indigestion or heartburn.     clonazePAM (KLONOPIN) 1 MG tablet Take 1 tablet (1 mg total) by mouth daily as needed for anxiety. 60 tablet 0   cyanocobalamin (VITAMIN B12) 1000 MCG tablet Take by mouth every other day.     diphenhydrAMINE (BENADRYL) 25 MG tablet Take 25 mg by mouth at bedtime as needed for allergies.     fluticasone (FLONASE) 50 MCG/ACT nasal spray Place 2 sprays into both nostrils daily as needed for allergies.     furosemide (LASIX) 20 MG tablet Take 1 tablet (20 mg total) by mouth daily as needed for edema. 30 tablet 3   loperamide (IMODIUM) 2 MG capsule as needed for diarrhea or loose stools.     metoprolol tartrate (LOPRESSOR) 25 MG tablet Take 1 tablet (25 mg total) by mouth 2 (two) times daily as needed (palpitations or HR>100).     Polyvinyl Alcohol-Povidone (REFRESH OP) Place 1 drop into both eyes daily as needed (dry eyes).     No current facility-administered medications for this visit.   Allergies:  Dust mite extract, Grass extracts [gramineae pollens], Grass pollen(k-o-r-t-swt vern), Other, and Statins   Social History: The patient  reports that she quit smoking about 5 months ago. Her smoking use included cigarettes. She has a 23 pack-year smoking history. She has never used smokeless tobacco. She reports current alcohol use of about 7.0 standard drinks of alcohol per week. She reports that she does not use drugs.   Family History: The patient's family history includes Atrial fibrillation in her brother; Congestive Heart Failure in her mother; Glaucoma in her maternal grandfather; Heart disease in her maternal grandmother; Pancreatic cancer in her father; Stroke in her maternal grandmother; Uterine cancer in her mother.   ROS:  Please see the history of present illness. Otherwise, complete review of systems is positive for none.  All other systems are reviewed and negative.   Physical Exam: VS:  There were no vitals taken for this visit., BMI  There is no height or weight on file to calculate BMI.  Wt Readings from Last 3 Encounters:  04/30/23 111 lb (50.3 kg)  04/08/23 129 lb 6.6 oz (58.7 kg)  09/21/22 18 lb (8.165 kg)    General: Patient appears comfortable at rest. HEENT: Conjunctiva and lids normal, oropharynx clear with moist mucosa. Neck: Supple, no elevated JVP or carotid bruits, no thyromegaly. Lungs: Clear to auscultation, nonlabored breathing at rest. Cardiac: Regular rate and rhythm, no S3 or significant systolic murmur, no pericardial rub. Abdomen: Soft, nontender, no hepatomegaly, bowel sounds present, no guarding or rebound. Extremities: No pitting edema, distal pulses 2+. Skin: Warm and dry. Musculoskeletal: No kyphosis. Neuropsychiatric: Alert and oriented x3, affect grossly appropriate.  ECG:  An ECG dated 09/21/2022 was personally reviewed today and demonstrated:  Normal sinus rhythm with PACs  Recent Labwork: 04/04/2023: TSH 1.631 04/05/2023: B Natriuretic Peptide 565.0 04/09/2023: BUN 18; Creatinine, Ser 1.12; Hemoglobin 12.1; Magnesium 1.8; Platelets 191; Potassium 3.8; Sodium 134  Component Value Date/Time   CHOL 115 04/05/2023 0407   TRIG 65 04/05/2023 0407   HDL 46 04/05/2023 0407   CHOLHDL 2.5 04/05/2023 0407   VLDL 13 04/05/2023 0407   LDLCALC 56 04/05/2023 0407   LDLCALC 101 (H) 09/24/2018 0924    Other Studies Reviewed Today: Echocardiogram in 2022 LVEF 50 to 55% No valve abnormalities  Assessment and Plan:  # Paroxysmal A-fib s/p TEE guided DCCV in September 2024 -Patient has been losing hair for the last 2 months, amiodarone discontinued 1 month ago.  Obtain TSH.  Continue metoprolol tartrate 25 mg twice daily as needed and Eliquis 5 mg twice daily.  She has been taking Eliquis 5 mg once daily up until September 2024 hospitalization where she was educated to take Eliquis 5 mg twice daily.  # Tachycardia induced cardiomyopathy # HFimpEF -LVEF improved after DCCV in September 2012 4.   Repeat echocardiogram in October 2024 showed LVEF 55%.  Not on GDMT.  Compensated.    Medication Adjustments/Labs and Tests Ordered: Current medicines are reviewed at length with the patient today.  Concerns regarding medicines are outlined above.   Tests Ordered: No orders of the defined types were placed in this encounter.   Medication Changes: No orders of the defined types were placed in this encounter.   Disposition:  Follow up 8 months  Signed, Kavaughn Faucett Verne Spurr, MD, 08/19/2023 3:29 PM    [facility] [address], [place], [place] [address]

## 2023-08-20 LAB — TSH: TSH: 5.32 u[IU]/mL — ABNORMAL HIGH (ref 0.450–4.500)

## 2023-08-21 ENCOUNTER — Encounter: Payer: Self-pay | Admitting: Internal Medicine

## 2023-08-23 ENCOUNTER — Telehealth: Payer: Self-pay | Admitting: Internal Medicine

## 2023-08-23 ENCOUNTER — Encounter: Payer: Self-pay | Admitting: Internal Medicine

## 2023-08-23 NOTE — Telephone Encounter (Signed)
Pt states she is returning two calls today, please advise.

## 2023-08-26 ENCOUNTER — Other Ambulatory Visit: Payer: Self-pay

## 2023-08-26 DIAGNOSIS — I480 Paroxysmal atrial fibrillation: Secondary | ICD-10-CM

## 2023-08-26 DIAGNOSIS — I5032 Chronic diastolic (congestive) heart failure: Secondary | ICD-10-CM

## 2023-08-26 NOTE — Telephone Encounter (Signed)
Responded to pt via MyChart. Pt needs T3, T4 drawn d/t elevated TSH levels per Dr. Jenene Slicker.

## 2023-08-28 DIAGNOSIS — I5032 Chronic diastolic (congestive) heart failure: Secondary | ICD-10-CM | POA: Diagnosis not present

## 2023-08-28 DIAGNOSIS — I480 Paroxysmal atrial fibrillation: Secondary | ICD-10-CM | POA: Diagnosis not present

## 2023-08-29 ENCOUNTER — Telehealth: Payer: Self-pay

## 2023-08-29 LAB — T3: T3, Total: 63 ng/dL — ABNORMAL LOW (ref 71–180)

## 2023-08-29 LAB — T4: T4, Total: 6.7 ug/dL (ref 4.5–12.0)

## 2023-08-29 MED ORDER — LEVOTHYROXINE SODIUM 25 MCG PO TABS: 25.0000 ug | ORAL_TABLET | Freq: Every day | ORAL | 11 refills | Status: AC

## 2023-08-29 NOTE — Telephone Encounter (Signed)
-----   Message from Vishnu P Mallipeddi sent at 08/29/2023  9:13 AM EST ----- T3 is mildly reduced and TSH is mildly elevated. Can start Levothyroxine  25 mcg daily before breakfast and follow up with PCP.

## 2023-08-29 NOTE — Telephone Encounter (Signed)
 The patient has been notified of the result and verbalized understanding.  All questions (if any) were answered. Casper Clement, CMA 08/29/2023 9:30 AM

## 2023-09-02 ENCOUNTER — Encounter: Payer: Self-pay | Admitting: Internal Medicine

## 2023-09-19 DIAGNOSIS — E039 Hypothyroidism, unspecified: Secondary | ICD-10-CM | POA: Diagnosis not present

## 2023-09-26 DIAGNOSIS — E039 Hypothyroidism, unspecified: Secondary | ICD-10-CM | POA: Diagnosis not present

## 2023-10-15 ENCOUNTER — Other Ambulatory Visit: Payer: Self-pay | Admitting: Internal Medicine

## 2023-10-15 DIAGNOSIS — I480 Paroxysmal atrial fibrillation: Secondary | ICD-10-CM

## 2023-10-16 ENCOUNTER — Telehealth: Payer: Self-pay | Admitting: Pharmacy Technician

## 2023-10-16 ENCOUNTER — Telehealth: Payer: Self-pay | Admitting: Internal Medicine

## 2023-10-16 ENCOUNTER — Other Ambulatory Visit (HOSPITAL_COMMUNITY): Payer: Self-pay

## 2023-10-16 NOTE — Telephone Encounter (Signed)
 Pt c/o medication issue:  1. Name of Medication:   apixaban (ELIQUIS) 5 MG TABS tablet   2. How are you currently taking this medication (dosage and times per day)?   As prescribed  3. Are you having a reaction (difficulty breathing--STAT)?   4. What is your medication issue?   Patient stated her pharmacy [facility] [phone] - [place], [place] - [address]) told her she will need prior authorization for this medication. Patient noted she only has 2 days left of this medication.

## 2023-10-16 NOTE — Telephone Encounter (Signed)
 Prescription refill request for Eliquis received. Indication: Afib  Last office visit: 08/19/23 (Mallipedi)  Scr: 1.12 (04/09/23)  Age: 68 Weight: 54.6kg  Appropriate dose. Refill sent.

## 2023-10-16 NOTE — Telephone Encounter (Signed)
 Pharmacy Patient Advocate Encounter   Received notification from Pt Calls Messages that prior authorization for Eliquis is required/requested.   Insurance verification completed.   The patient is insured through [employer] .   Per test claim: Refill too soon. PA is not needed at this time. Medication was filled 10/16/23. Next eligible fill date is 11/08/23.

## 2023-12-24 DIAGNOSIS — I788 Other diseases of capillaries: Secondary | ICD-10-CM | POA: Diagnosis not present

## 2023-12-24 DIAGNOSIS — L650 Telogen effluvium: Secondary | ICD-10-CM | POA: Diagnosis not present

## 2024-01-09 DIAGNOSIS — E538 Deficiency of other specified B group vitamins: Secondary | ICD-10-CM | POA: Diagnosis not present

## 2024-01-09 DIAGNOSIS — I10 Essential (primary) hypertension: Secondary | ICD-10-CM | POA: Diagnosis not present

## 2024-01-09 DIAGNOSIS — E559 Vitamin D deficiency, unspecified: Secondary | ICD-10-CM | POA: Diagnosis not present

## 2024-01-09 DIAGNOSIS — R7301 Impaired fasting glucose: Secondary | ICD-10-CM | POA: Diagnosis not present

## 2024-01-09 DIAGNOSIS — I700 Atherosclerosis of aorta: Secondary | ICD-10-CM | POA: Diagnosis not present

## 2024-01-09 DIAGNOSIS — D649 Anemia, unspecified: Secondary | ICD-10-CM | POA: Diagnosis not present

## 2024-01-15 DIAGNOSIS — E559 Vitamin D deficiency, unspecified: Secondary | ICD-10-CM | POA: Diagnosis not present

## 2024-01-15 DIAGNOSIS — D649 Anemia, unspecified: Secondary | ICD-10-CM | POA: Diagnosis not present

## 2024-01-15 DIAGNOSIS — I1310 Hypertensive heart and chronic kidney disease without heart failure, with stage 1 through stage 4 chronic kidney disease, or unspecified chronic kidney disease: Secondary | ICD-10-CM | POA: Diagnosis not present

## 2024-01-15 DIAGNOSIS — E538 Deficiency of other specified B group vitamins: Secondary | ICD-10-CM | POA: Diagnosis not present

## 2024-01-15 DIAGNOSIS — Z9049 Acquired absence of other specified parts of digestive tract: Secondary | ICD-10-CM | POA: Diagnosis not present

## 2024-01-15 DIAGNOSIS — I700 Atherosclerosis of aorta: Secondary | ICD-10-CM | POA: Diagnosis not present

## 2024-01-15 DIAGNOSIS — Z0000 Encounter for general adult medical examination without abnormal findings: Secondary | ICD-10-CM | POA: Diagnosis not present

## 2024-01-15 DIAGNOSIS — E039 Hypothyroidism, unspecified: Secondary | ICD-10-CM | POA: Diagnosis not present

## 2024-01-15 DIAGNOSIS — N321 Vesicointestinal fistula: Secondary | ICD-10-CM | POA: Diagnosis not present

## 2024-01-15 DIAGNOSIS — N1831 Chronic kidney disease, stage 3a: Secondary | ICD-10-CM | POA: Diagnosis not present

## 2024-03-09 ENCOUNTER — Other Ambulatory Visit: Payer: Self-pay | Admitting: Internal Medicine

## 2024-04-15 DIAGNOSIS — Z23 Encounter for immunization: Secondary | ICD-10-CM | POA: Diagnosis not present

## 2024-06-18 ENCOUNTER — Emergency Department (HOSPITAL_COMMUNITY)

## 2024-06-18 ENCOUNTER — Other Ambulatory Visit: Payer: Self-pay

## 2024-06-18 ENCOUNTER — Inpatient Hospital Stay (HOSPITAL_COMMUNITY)
Admission: EM | Admit: 2024-06-18 | Discharge: 2024-06-21 | DRG: 189 | Disposition: A | Discharge status: Home / Self Care | Attending: Internal Medicine | Admitting: Internal Medicine

## 2024-06-18 ENCOUNTER — Encounter (HOSPITAL_COMMUNITY): Payer: Self-pay

## 2024-06-18 DIAGNOSIS — R531 Weakness: Secondary | ICD-10-CM

## 2024-06-18 DIAGNOSIS — F17200 Nicotine dependence, unspecified, uncomplicated: Secondary | ICD-10-CM | POA: Diagnosis not present

## 2024-06-18 DIAGNOSIS — W010XXA Fall on same level from slipping, tripping and stumbling without subsequent striking against object, initial encounter: Secondary | ICD-10-CM | POA: Diagnosis present

## 2024-06-18 DIAGNOSIS — J439 Emphysema, unspecified: Secondary | ICD-10-CM | POA: Diagnosis present

## 2024-06-18 DIAGNOSIS — F17210 Nicotine dependence, cigarettes, uncomplicated: Secondary | ICD-10-CM | POA: Diagnosis present

## 2024-06-18 DIAGNOSIS — R911 Solitary pulmonary nodule: Secondary | ICD-10-CM | POA: Diagnosis present

## 2024-06-18 DIAGNOSIS — E871 Hypo-osmolality and hyponatremia: Secondary | ICD-10-CM | POA: Diagnosis not present

## 2024-06-18 DIAGNOSIS — Z823 Family history of stroke: Secondary | ICD-10-CM

## 2024-06-18 DIAGNOSIS — I5042 Chronic combined systolic (congestive) and diastolic (congestive) heart failure: Secondary | ICD-10-CM | POA: Diagnosis present

## 2024-06-18 DIAGNOSIS — J9601 Acute respiratory failure with hypoxia: Secondary | ICD-10-CM | POA: Diagnosis not present

## 2024-06-18 DIAGNOSIS — I2489 Other forms of acute ischemic heart disease: Secondary | ICD-10-CM | POA: Diagnosis present

## 2024-06-18 DIAGNOSIS — Z888 Allergy status to other drugs, medicaments and biological substances status: Secondary | ICD-10-CM

## 2024-06-18 DIAGNOSIS — I509 Heart failure, unspecified: Secondary | ICD-10-CM

## 2024-06-18 DIAGNOSIS — Z79899 Other long term (current) drug therapy: Secondary | ICD-10-CM

## 2024-06-18 DIAGNOSIS — Z933 Colostomy status: Secondary | ICD-10-CM

## 2024-06-18 DIAGNOSIS — S8012XA Contusion of left lower leg, initial encounter: Secondary | ICD-10-CM | POA: Diagnosis present

## 2024-06-18 DIAGNOSIS — J441 Chronic obstructive pulmonary disease with (acute) exacerbation: Secondary | ICD-10-CM | POA: Diagnosis not present

## 2024-06-18 DIAGNOSIS — M109 Gout, unspecified: Secondary | ICD-10-CM | POA: Diagnosis present

## 2024-06-18 DIAGNOSIS — Z8049 Family history of malignant neoplasm of other genital organs: Secondary | ICD-10-CM

## 2024-06-18 DIAGNOSIS — J9602 Acute respiratory failure with hypercapnia: Secondary | ICD-10-CM | POA: Diagnosis not present

## 2024-06-18 DIAGNOSIS — Z79890 Hormone replacement therapy (postmenopausal): Secondary | ICD-10-CM

## 2024-06-18 DIAGNOSIS — Z9049 Acquired absence of other specified parts of digestive tract: Secondary | ICD-10-CM

## 2024-06-18 DIAGNOSIS — Z800 Family history of malignant neoplasm of digestive organs: Secondary | ICD-10-CM

## 2024-06-18 DIAGNOSIS — F419 Anxiety disorder, unspecified: Secondary | ICD-10-CM | POA: Diagnosis present

## 2024-06-18 DIAGNOSIS — E869 Volume depletion, unspecified: Secondary | ICD-10-CM | POA: Diagnosis present

## 2024-06-18 DIAGNOSIS — Z1152 Encounter for screening for COVID-19: Secondary | ICD-10-CM

## 2024-06-18 DIAGNOSIS — Z7901 Long term (current) use of anticoagulants: Secondary | ICD-10-CM

## 2024-06-18 DIAGNOSIS — I480 Paroxysmal atrial fibrillation: Secondary | ICD-10-CM | POA: Diagnosis not present

## 2024-06-18 DIAGNOSIS — I110 Hypertensive heart disease with heart failure: Secondary | ICD-10-CM | POA: Diagnosis present

## 2024-06-18 DIAGNOSIS — Z8249 Family history of ischemic heart disease and other diseases of the circulatory system: Secondary | ICD-10-CM

## 2024-06-18 HISTORY — DX: Unspecified atrial fibrillation: I48.91

## 2024-06-18 LAB — COMPREHENSIVE METABOLIC PANEL WITH GFR
ALT: 32 U/L (ref 0–44)
AST: 41 U/L (ref 15–41)
Albumin: 4.6 g/dL (ref 3.5–5.0)
Alkaline Phosphatase: 94 U/L (ref 38–126)
Anion gap: 12 (ref 5–15)
BUN: 12 mg/dL (ref 8–23)
CO2: 26 mmol/L (ref 22–32)
Calcium: 9.4 mg/dL (ref 8.9–10.3)
Chloride: 88 mmol/L — ABNORMAL LOW (ref 98–111)
Creatinine, Ser: 0.77 mg/dL (ref 0.44–1.00)
GFR, Estimated: >60 mL/min (ref >60)
Glucose, Bld: 155 mg/dL — ABNORMAL HIGH (ref 70–99)
Potassium: 4.9 mmol/L (ref 3.5–5.1)
Sodium: 126 mmol/L — ABNORMAL LOW (ref 135–145)
Total Bilirubin: 1.4 mg/dL — ABNORMAL HIGH (ref 0.0–1.2)
Total Protein: 7.7 g/dL (ref 6.5–8.1)

## 2024-06-18 LAB — CBC WITH DIFFERENTIAL/PLATELET
Abs Immature Granulocytes: 0.04 K/uL (ref 0.00–0.07)
Basophils Absolute: 0.1 K/uL (ref 0.0–0.1)
Basophils Relative: 1 %
Eosinophils Absolute: 0.1 K/uL (ref 0.0–0.5)
Eosinophils Relative: 1 %
HCT: 44.8 % (ref 36.0–46.0)
Hemoglobin: 15.8 g/dL — ABNORMAL HIGH (ref 12.0–15.0)
Immature Granulocytes: 0 %
Lymphocytes Relative: 6 %
Lymphs Abs: 0.7 K/uL (ref 0.7–4.0)
MCH: 35 pg — ABNORMAL HIGH (ref 26.0–34.0)
MCHC: 35.3 g/dL (ref 30.0–36.0)
MCV: 99.1 fL (ref 80.0–100.0)
Monocytes Absolute: 0.7 K/uL (ref 0.1–1.0)
Monocytes Relative: 6 %
Neutro Abs: 10.2 K/uL — ABNORMAL HIGH (ref 1.7–7.7)
Neutrophils Relative %: 86 %
Platelets: 149 K/uL — ABNORMAL LOW (ref 150–400)
RBC: 4.52 MIL/uL (ref 3.87–5.11)
RDW: 13.4 % (ref 11.5–15.5)
WBC: 11.7 K/uL — ABNORMAL HIGH (ref 4.0–10.5)
nRBC: 0 % (ref 0.0–0.2)

## 2024-06-18 LAB — BLOOD GAS, VENOUS
Acid-Base Excess: 3.1 mmol/L — ABNORMAL HIGH (ref 0.0–2.0)
Bicarbonate: 30.2 mmol/L — ABNORMAL HIGH (ref 20.0–28.0)
Drawn by: 52113
O2 Saturation: 47.5 %
Patient temperature: 36.4
pCO2, Ven: 55 mmHg (ref 44–60)
pH, Ven: 7.35 (ref 7.25–7.43)
pO2, Ven: <31 mmHg — CL (ref 32–45)

## 2024-06-18 LAB — TROPONIN T, HIGH SENSITIVITY
Troponin T High Sensitivity: 26 ng/L — ABNORMAL HIGH (ref 0–19)
Troponin T High Sensitivity: 50 ng/L — ABNORMAL HIGH (ref 0–19)

## 2024-06-18 LAB — PRO BRAIN NATRIURETIC PEPTIDE: Pro Brain Natriuretic Peptide: 1604 pg/mL — ABNORMAL HIGH (ref <300.0)

## 2024-06-18 MED ORDER — PREDNISONE 20 MG PO TABS: 40.0000 mg | ORAL_TABLET | Freq: Every day | ORAL | Status: DC

## 2024-06-18 MED ORDER — METHYLPREDNISOLONE SODIUM SUCC 125 MG IJ SOLR
60.0000 mg | Freq: Two times a day (BID) | INTRAMUSCULAR | Status: DC
  Administered 2024-06-19: 60 mg via INTRAVENOUS
  Filled 2024-06-18: qty 2

## 2024-06-18 MED ORDER — IPRATROPIUM-ALBUTEROL 0.5-2.5 (3) MG/3ML IN SOLN
3.0000 mL | Freq: Three times a day (TID) | RESPIRATORY_TRACT | Status: DC
  Filled 2024-06-18: qty 3

## 2024-06-18 MED ORDER — FUROSEMIDE 10 MG/ML IJ SOLN
40.0000 mg | INTRAMUSCULAR | Status: AC
  Administered 2024-06-18: 40 mg via INTRAVENOUS
  Filled 2024-06-18: qty 4

## 2024-06-18 MED ORDER — METHOCARBAMOL 500 MG PO TABS
500.0000 mg | ORAL_TABLET | Freq: Three times a day (TID) | ORAL | Status: DC | PRN
  Administered 2024-06-19 – 2024-06-21 (×4): 500 mg via ORAL
  Filled 2024-06-18 (×4): qty 1

## 2024-06-18 MED ORDER — GUAIFENESIN-DM 100-10 MG/5ML PO SYRP: 15.0000 mL | ORAL_SOLUTION | Freq: Three times a day (TID) | ORAL | Status: DC | PRN

## 2024-06-18 MED ORDER — APIXABAN 5 MG PO TABS
5.0000 mg | ORAL_TABLET | Freq: Two times a day (BID) | ORAL | Status: DC
  Administered 2024-06-18 – 2024-06-21 (×6): 5 mg via ORAL
  Filled 2024-06-18 (×6): qty 1

## 2024-06-18 MED ORDER — ONDANSETRON HCL 4 MG/2ML IJ SOLN: 4.0000 mg | Freq: Four times a day (QID) | INTRAMUSCULAR | Status: DC | PRN

## 2024-06-18 MED ORDER — ONDANSETRON HCL 4 MG PO TABS: 4.0000 mg | ORAL_TABLET | Freq: Four times a day (QID) | ORAL | Status: DC | PRN

## 2024-06-18 MED ORDER — METHYLPREDNISOLONE SODIUM SUCC 125 MG IJ SOLR
125.0000 mg | Freq: Once | INTRAMUSCULAR | Status: AC
  Administered 2024-06-18: 125 mg via INTRAVENOUS
  Filled 2024-06-18: qty 2

## 2024-06-18 MED ORDER — HYDROCODONE-ACETAMINOPHEN 5-325 MG PO TABS: 1.0000 | ORAL_TABLET | Freq: Three times a day (TID) | ORAL | Status: DC | PRN

## 2024-06-18 MED ORDER — ACETAMINOPHEN 325 MG PO TABS: 650.0000 mg | ORAL_TABLET | Freq: Four times a day (QID) | ORAL | Status: DC | PRN

## 2024-06-18 MED ORDER — ALBUTEROL SULFATE (2.5 MG/3ML) 0.083% IN NEBU: 10.0000 mg | INHALATION_SOLUTION | RESPIRATORY_TRACT | Status: AC

## 2024-06-18 MED ORDER — ACETAMINOPHEN 650 MG RE SUPP: 650.0000 mg | Freq: Four times a day (QID) | RECTAL | Status: DC | PRN

## 2024-06-18 MED ORDER — METOPROLOL TARTRATE 25 MG PO TABS
12.5000 mg | ORAL_TABLET | Freq: Two times a day (BID) | ORAL | Status: DC
  Administered 2024-06-18 – 2024-06-21 (×6): 12.5 mg via ORAL
  Filled 2024-06-18 (×6): qty 1

## 2024-06-18 MED ORDER — AZITHROMYCIN 250 MG PO TABS
500.0000 mg | ORAL_TABLET | Freq: Every day | ORAL | Status: AC
  Administered 2024-06-19 – 2024-06-20 (×2): 500 mg via ORAL
  Filled 2024-06-18 (×2): qty 2

## 2024-06-18 MED ORDER — KETOROLAC TROMETHAMINE 30 MG/ML IJ SOLN
30.0000 mg | Freq: Once | INTRAMUSCULAR | Status: AC
  Administered 2024-06-18: 30 mg via INTRAVENOUS
  Filled 2024-06-18: qty 1

## 2024-06-18 MED ORDER — IPRATROPIUM-ALBUTEROL 0.5-2.5 (3) MG/3ML IN SOLN
3.0000 mL | Freq: Three times a day (TID) | RESPIRATORY_TRACT | Status: AC
  Administered 2024-06-19 (×3): 3 mL via RESPIRATORY_TRACT
  Filled 2024-06-18 (×3): qty 3

## 2024-06-18 MED ORDER — SODIUM CHLORIDE 0.9 % IV SOLN: INTRAVENOUS | Status: AC

## 2024-06-18 MED ORDER — SODIUM CHLORIDE 0.9 % IV SOLN
500.0000 mg | INTRAVENOUS | Status: AC
  Administered 2024-06-18: 500 mg via INTRAVENOUS
  Filled 2024-06-18: qty 5

## 2024-06-18 MED ORDER — ALBUTEROL SULFATE (2.5 MG/3ML) 0.083% IN NEBU
10.0000 mg | INHALATION_SOLUTION | RESPIRATORY_TRACT | Status: AC
  Administered 2024-06-18: 10 mg via RESPIRATORY_TRACT
  Filled 2024-06-18: qty 12

## 2024-06-18 MED ORDER — IPRATROPIUM-ALBUTEROL 0.5-2.5 (3) MG/3ML IN SOLN: 3.0000 mL | RESPIRATORY_TRACT | Status: DC | PRN

## 2024-06-18 NOTE — H&P (Signed)
 History and Physical    Brandi Rowe FMW:[id number] DOB: 1955/11/24 DOA: 06/18/2024  PCP: Shona Norleen PEDLAR, MD   Patient coming from: Home  I have personally briefly reviewed patient's old medical records in [facility]  Chief Complaint: Fall, difficulty breathing  HPI: Brandi Rowe is a 68 y.o. female with medical history significant for atrial fibrillation, gout, small bowel obstruction, tobacco abuse. Patient presented to the ED with complaints of a fall last night.  Patient took a month-long vacation to [place] to spend time with family with plans to come after Thanksgiving.  Patient was stepping off the trailer onto a stool and she tripped and fell onto the floor landing on her left side.  She has since not been able to bear weight on the leg. She reports ongoing progressive difficulty breathing for several weeks to months, with intermittent productive cough.  No chest pain.  No lower extremity swelling.  She smokes cigarettes.  No prior diagnosis of lung disease.  ED Course: Temperature 97.5.  Heart rate 96-114.  Respiratory rate 23-32.  Blood pressure systolic 164 -192.  O2 sats 83% on room air placed on 4 L. Chest x-ray shows chronic bronchitis. Pelvic x-ray, left knee x-ray, femur x-ray negative for acute abnormality. IV Solu-Medrol  125 mg Given in ED. Ketorolac  30 mg Given. Lasix  40 mg x 1 given. Continuous albuterol  nebs given.  Review of Systems: As per HPI all other systems reviewed and negative.  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   Dehydration 01/04/2021   Elevated MCV 01/04/2021   Gout    Hypertension    Nausea 01/04/2021   PAF (paroxysmal atrial fibrillation) (HCC)    a. 06/2021 in setting of SBO, HypoK/Mg; b. 06/2021 Echo: EF 50-55%, no rwma, GrI DD, Nl RV size/fxn, RVSP 36.26mmHg. Mod dil  RA. Triv MR.   Sepsis (HCC) 01/05/2021   Tobacco abuse     Past Surgical History:  Procedure Laterality Date   BLADDER REPAIR N/A 05/17/2021   Procedure: Closure of Cystotomy;  Surgeon: Sherrilee Belvie CROME, MD;  Location: [facility] ORS;  Service: Urology;  Laterality: N/A;   CARDIOVERSION N/A 04/09/2023   Procedure: CARDIOVERSION;  Surgeon: Stacia Diannah SQUIBB, MD;  Location: [facility] ORS;  Service: Cardiovascular;  Laterality: N/A;   CARPAL TUNNEL RELEASE Right 2002   COLONOSCOPY WITH PROPOFOL  N/A 09/19/2021   Procedure: COLONOSCOPY WITH PROPOFOL ;  Surgeon: Mavis Anes, MD;  Location: [facility] ENDO SUITE;  Service: Gastroenterology;  Laterality: N/A;   COLOSTOMY REVERSAL N/A 09/20/2021   Procedure: Small Bowel Resection;  Surgeon: Mavis Anes, MD;  Location: [facility] ORS;  Service: General;  Laterality: N/A;   MANDIBLE SURGERY  1985   PARTIAL COLECTOMY N/A 05/17/2021   Procedure: PARTIAL COLECTOMY WITH COLOSTOMY;  Surgeon: Mavis Anes, MD;  Location: [facility] ORS;  Service: General;  Laterality: N/A;   PELVIC LYMPH NODE DISSECTION N/A 05/17/2021   Procedure: PELVIC DISSECTION;  Surgeon: Jayne Vonn DEL, MD;  Location: [facility] ORS;  Service: Gynecology;  Laterality: N/A;   TEE WITHOUT CARDIOVERSION N/A 04/09/2023   Procedure: TRANSESOPHAGEAL ECHOCARDIOGRAM (TEE);  Surgeon: Mallipeddi, Vishnu P, MD;  Location: [facility] OR[facility];  Service: Cardiovascular;  Laterality: N/A;     reports that she has been smoking cigarettes. She has a 23 pack-year smoking history. She has never  used smokeless tobacco. She reports current alcohol  use of about 7.0 standard drinks of alcohol  per week. She reports that she does not use drugs.  Allergies  Allergen Reactions   Dust Mite Extract Other (See Comments)   Grass Extracts [Gramineae Pollens] Other (See Comments)    Dust, mold, mites Congestion   Grass Pollen(K-O-R-T-Swt Vern) Other (See Comments)   Other Other (See Comments)    3-Hydroxy-3-Methylglutaryl-Coenzyme A Reductase Inhibitor    Statins Other (See Comments)    Stiffness in body    Family History  Problem Relation Age of Onset   Congestive Heart Failure Mother    Uterine cancer Mother    Pancreatic cancer Father    Atrial fibrillation Brother    Stroke Maternal Grandmother    Heart disease Maternal Grandmother    Glaucoma Maternal Grandfather     Prior to Admission medications   Medication Sig Start Date End Date Taking? Authorizing Provider  acetaminophen  (TYLENOL ) 500 MG tablet Take 500 mg by mouth 2 (two) times daily as needed for moderate pain or headache.    [provider]  apixaban  (ELIQUIS ) 5 MG TABS tablet TAKE 1 TABLET(5 MG) BY MOUTH TWICE DAILY 10/16/23   Mallipeddi, Vishnu P, MD  calcium  carbonate (TUMS - DOSED IN MG ELEMENTAL CALCIUM ) 500 MG chewable tablet Chew 1 tablet by mouth daily as needed for indigestion or heartburn.    [provider]  clonazePAM  (KLONOPIN ) 1 MG tablet Take 1 tablet (1 mg total) by mouth daily as needed for anxiety. 07/27/21   Chandra Harlene LABOR, NP  cyanocobalamin  (VITAMIN B12) 1000 MCG tablet Take by mouth every other day. 04/30/22   [provider]  diphenhydrAMINE  (BENADRYL ) 25 MG tablet Take 25 mg by mouth at bedtime as needed for allergies.    [provider]  fluticasone  (FLONASE ) 50 MCG/ACT nasal spray Place 2 sprays into both nostrils daily as needed for allergies.    [provider]  furosemide  (LASIX ) 20 MG tablet Take 1 tablet (20 mg total) by mouth daily as needed for edema. 04/11/23 07/10/23  Strader, Laymon HERO, PA-C  levothyroxine  (SYNTHROID ) 25 MCG tablet Take 1 tablet (25 mcg total) by mouth daily before breakfast. 08/29/23   Mallipeddi, Vishnu P, MD  loperamide  (IMODIUM ) 2 MG capsule as needed for diarrhea or loose stools.    [provider]  metoprolol  tartrate (LOPRESSOR ) 25 MG tablet TAKE 1/2 TABLET(12.5 MG) BY MOUTH TWICE DAILY 03/10/24   Mallipeddi, Vishnu P, MD  Multiple Vitamin (MULTIVITAMIN) tablet Take  1 tablet by mouth daily.    [provider]  Polyvinyl Alcohol -Povidone (REFRESH OP) Place 1 drop into both eyes daily as needed (dry eyes).    [provider]    Physical Exam: Vitals:   06/18/24 1616 06/18/24 1617 06/18/24 1650 06/18/24 1705  BP: (!) 164/128  (!) 190/110   Pulse: (!) 111 (!) 109 (!) 102   Resp: (!) 22 (!) 32 (!) 25   Temp: (!) 97.5 F (36.4 C)     TempSrc: Oral     SpO2: 95% 96% 90% 99%  Weight:      Height:        Constitutional: NAD, calm, comfortable Vitals:   06/18/24 1616 06/18/24 1617 06/18/24 1650 06/18/24 1705  BP: (!) 164/128  (!) 190/110   Pulse: (!) 111 (!) 109 (!) 102   Resp: (!) 22 (!) 32 (!) 25   Temp: (!) 97.5 F (36.4 C)     TempSrc: Oral  SpO2: 95% 96% 90% 99%  Weight:      Height:       Eyes: PERRL, lids and conjunctivae normal ENMT: Mucous membranes are moist.   Neck: normal, supple, no masses, no thyromegaly Respiratory: Diffuse expiratory wheezing,  Normal respiratory effort. No accessory muscle use.  Cardiovascular: Regular rate and rhythm, no murmurs / rubs / gallops. No extremity edema.  Abdomen: no tenderness, no masses palpated. No hepatosplenomegaly.  Musculoskeletal: no clubbing / cyanosis. No joint deformity upper and lower extremities.  Skin: no rashes, lesions, ulcers. No induration Neurologic: No facial asymmetry, moves extremity spontaneously, speech fluent. Psychiatric: Normal judgment and insight. Alert and oriented x 3. Normal mood.   Labs on Admission: I have personally reviewed following labs and imaging studies  CBC: Recent Labs  Lab 06/18/24 1620  WBC 11.7*  NEUTROABS 10.2*  HGB 15.8*  HCT 44.8  MCV 99.1  PLT 149*   Basic Metabolic Panel: Recent Labs  Lab 06/18/24 1620  NA 126*  K 4.9  CL 88*  CO2 26  GLUCOSE 155*  BUN 12  CREATININE 0.77  CALCIUM  9.4   GFR: Estimated Creatinine Clearance: 57.2 mL/min (by C-G formula based on SCr of 0.77 mg/dL). Liver Function  Tests: Recent Labs  Lab 06/18/24 1620  AST 41  ALT 32  ALKPHOS 94  BILITOT 1.4*  PROT 7.7  ALBUMIN 4.6   BNP (last 3 results) Recent Labs    06/18/24 1620  PROBNP 1,604.0*    Radiological Exams on Admission: DG Chest Port 1 View Result Date: 06/18/2024 EXAM: 1 VIEW(S) XRAY OF THE CHEST 06/18/2024 05:09:00 PM COMPARISON: Comparison with 04/03/2023. CLINICAL HISTORY: [id number] Trauma [id number]. Patient fell yesterday. FINDINGS: LUNGS AND PLEURA: Emphysematous changes in the lungs. Peribronchial thickening with diffuse interstitial pattern likely due to chronic bronchitis and fibrosis. No superimposed infiltration or atelectasis. No pleural effusion or pneumothorax. Suggestion of small developing nodule in the right upper lobe projecting over the posterior 7th rib. Suggest CT for further evaluation. HEART AND MEDIASTINUM: Mild cardiac enlargement. Calcification of the aorta. BONES AND SOFT TISSUES: Visualized bones are nondisplaced. IMPRESSION: 1. No acute traumatic cardiopulmonary abnormality identified. 2. Visualized osseous structures are nondisplaced. 3. Suggestion of a small developing right upper-lobe pulmonary nodule; recommend chest CT for further evaluation. 4. Emphysematous changes with peribronchial thickening and diffuse interstitial pattern, likely due to chronic bronchitis and fibrosis. Electronically signed by: Elsie Gravely MD 06/18/2024 05:20 PM EST RP Workstation: [id number]   DG Pelvis 1-2 Views Result Date: 06/18/2024 CLINICAL DATA:  Clemens yesterday.  Left hip/pelvic pain. EXAM: PELVIS - 1-2 VIEW COMPARISON:  07/18/2021. FINDINGS: No fracture or bone lesion. Hip joints, SI joints and pubic symphysis are normally spaced and aligned. Skeletal structures are demineralized. Bowel anastomosis staple line projects in the central pelvis. IMPRESSION: No fracture or dislocation.  No acute finding. Electronically Signed   By: Alm Parkins M.D.   On: 06/18/2024 17:16   DG Knee Complete 4  Views Left Result Date: 06/18/2024 CLINICAL DATA:  Clemens yesterday.  Left knee pain. EXAM: LEFT KNEE - COMPLETE 4+ VIEW COMPARISON:  None Available. FINDINGS: No evidence of fracture, dislocation, or joint effusion. No evidence of arthropathy or other focal bone abnormality. Soft tissues are unremarkable. IMPRESSION: Negative. Electronically Signed   By: Alm Parkins M.D.   On: 06/18/2024 17:15   DG Femur Min 2 Views Left Result Date: 06/18/2024 CLINICAL DATA:  Clemens yesterday.  Left lower extremity pain. EXAM: LEFT FEMUR 2 VIEWS COMPARISON:  None  Available. FINDINGS: No fracture or bone lesion. Hip and knee joints are normally spaced and aligned. Soft tissues are unremarkable. IMPRESSION: Negative. Electronically Signed   By: Alm Parkins M.D.   On: 06/18/2024 17:14   EKG: Independently reviewed.  Sinus tachycardia rate 111.  QTc 456.  Assessment/Plan Principal Problem:   Acute hypoxic respiratory failure (HCC)  Assessment and Plan:  Acute hypoxic respiratory failure-O2 sat 83% on room air, currently on 4 L sats greater than 90%.  Likely due to chronic bronchitis/COPD.  Dyspnea cough progressive over the past few months.  Expiratory wheezing on exam.  Chest x-ray without acute abnormality, emphysematous changes with peribronchial thickening and diffuse interstitial pattern likely due to chronic bronchitis and fibrosis.  She is on Eliquis  and compliant.  Ongoing tobacco abuse. - DuoNebs as needed and scheduled - IV azithromycin  - IV Solu-Medrol  125 mg Given, Continue 60 Twice Daily - Mucolytics  Mechanical fall- patient unable to bear weight.  X-rays pelvis femur and knee negative for fracture. - Pain control, muscle relaxants for now - If persistent uncontrolled pain and unable to bear weight may need to consider further imaging  Hyponatremia-sodium 126, baseline low too mid 130s.  Med list has Lasix  20 mg daily.  No sign of volume overload.   - N/s 75cc/hr x 20 hrs  Atrial  fibrillation-heart rate 96-114. - Resume metoprolol , Eliquis   Chronic systolic CHF-stable and compensated.  Last echo 04/2023 EF of 55%, previously low EF of 40 to 45%.  No Sign of volume overload.  On Lasix  20 mg as needed.  proBNP elevated at 1604. - Gentle hydration for hyponatremia -   Tobacco abuse-she is working on quitting. - Patient extensively counseled on smoking. - Nicotine patch  Pulmonary nodule-incidental finding on chest x-ray today-  Suggestion of a small developing right upper-lobe pulmonary nodule; recommend chest CT for further evaluation.   DVT prophylaxis: Eliquis  Code Status: Full code Family Communication: Spouse at bedside Disposition Plan: ~ 2 days Consults called: None Admission status:  inpt tele I certify that at the point of admission it is my clinical judgment that the patient will require inpatient hospital care spanning beyond 2 midnights from the point of admission due to high intensity of service, high risk for further deterioration and high frequency of surveillance required.   Author: Tully FORBES Carwin, MD 06/18/2024 6:14 PM  For on call review [email or web address].

## 2024-06-18 NOTE — ED Triage Notes (Signed)
 Pt comes in for sob and a fall. Pt was getting out of a camper, missed the step and fell. Pt is having left hip and left rib pain. Pt is unable to bear weight on left leg.  Pt has a hx of a-fib. Pt has palpitations.  A&Ox4.

## 2024-06-18 NOTE — ED Notes (Signed)
 Delay in transport ot department 300 due to continuous neb still going.

## 2024-06-18 NOTE — ED Provider Notes (Signed)
 [facility] Provider Note   CSN: [id number] Arrival date & time: 06/18/24  [id number]     Patient presents with: Fall and Shortness of Breath (Hx of a-fib)   Brandi Rowe is a 68 y.o. female.    Fall Associated symptoms include shortness of breath.  Shortness of Breath    This patient is a 68 year old female, she has a history of being on Eliquis  because of atrial fibrillation, she has a history of hypothyroidism as well as hypertension, she is a chronic smoker but has never been diagnosed with COPD and does not use any inhalers at home.  She was at the beach over the last week and yesterday when she walked out of her trailer she tripped over a stepping stool landing on her left side injuring her left thigh her left anterior ribs.  She also notes that for the last month or 2 she has been increasingly short of breath with exertion, has noticed some wheezing, denies any swelling of her legs.  She does not have chest pain with this.  She coughs occasionally.  No prior diagnosis of COPD  Prior to Admission medications   Medication Sig Start Date End Date Taking? Authorizing Provider  acetaminophen  (TYLENOL ) 500 MG tablet Take 500 mg by mouth 2 (two) times daily as needed for moderate pain or headache.    [provider]  apixaban  (ELIQUIS ) 5 MG TABS tablet TAKE 1 TABLET(5 MG) BY MOUTH TWICE DAILY 10/16/23   Mallipeddi, Vishnu P, MD  calcium  carbonate (TUMS - DOSED IN MG ELEMENTAL CALCIUM ) 500 MG chewable tablet Chew 1 tablet by mouth daily as needed for indigestion or heartburn.    [provider]  clonazePAM  (KLONOPIN ) 1 MG tablet Take 1 tablet (1 mg total) by mouth daily as needed for anxiety. 07/27/21   Chandra Harlene LABOR, NP  cyanocobalamin  (VITAMIN B12) 1000 MCG tablet Take by mouth every other day. 04/30/22   [provider]  diphenhydrAMINE  (BENADRYL ) 25 MG tablet Take 25 mg by mouth at bedtime as needed for allergies.     [provider]  fluticasone  (FLONASE ) 50 MCG/ACT nasal spray Place 2 sprays into both nostrils daily as needed for allergies.    [provider]  furosemide  (LASIX ) 20 MG tablet Take 1 tablet (20 mg total) by mouth daily as needed for edema. 04/11/23 07/10/23  Johnson Laymon HERO, PA-C  levothyroxine  (SYNTHROID ) 25 MCG tablet Take 1 tablet (25 mcg total) by mouth daily before breakfast. 08/29/23   Mallipeddi, Vishnu P, MD  loperamide  (IMODIUM ) 2 MG capsule as needed for diarrhea or loose stools.    [provider]  metoprolol  tartrate (LOPRESSOR ) 25 MG tablet TAKE 1/2 TABLET(12.5 MG) BY MOUTH TWICE DAILY 03/10/24   Mallipeddi, Vishnu P, MD  Multiple Vitamin (MULTIVITAMIN) tablet Take 1 tablet by mouth daily.    [provider]  Polyvinyl Alcohol -Povidone (REFRESH OP) Place 1 drop into both eyes daily as needed (dry eyes).    [provider]    Allergies: Dust mite extract, Grass extracts [gramineae pollens], Grass pollen(k-o-r-t-swt vern), Other, and Statins    Review of Systems  Respiratory:  Positive for shortness of breath.   All other systems reviewed and are negative.   Updated Vital Signs BP (!) 190/110   Pulse (!) 102   Temp (!) 97.5 F (36.4 C) (Oral)   Resp (!) 25   Ht 1.626 m (5' 4)   Wt 54.6 kg  SpO2 99%   BMI 20.66 kg/m   Physical Exam Vitals and nursing note reviewed.  Constitutional:      General: She is in acute distress.     Appearance: She is well-developed.  HENT:     Head: Normocephalic and atraumatic.     Mouth/Throat:     Pharynx: No oropharyngeal exudate.  Eyes:     General: No scleral icterus.       Right eye: No discharge.        Left eye: No discharge.     Conjunctiva/sclera: Conjunctivae normal.     Pupils: Pupils are equal, round, and reactive to light.  Neck:     Thyroid : No thyromegaly.     Vascular: No JVD.  Cardiovascular:     Rate and Rhythm: Regular rhythm. Tachycardia present.     Heart  sounds: Normal heart sounds. No murmur heard.    No friction rub. No gallop.  Pulmonary:     Effort: Respiratory distress present.     Breath sounds: Wheezing present. No rales.  Chest:     Chest wall: Tenderness present.  Abdominal:     General: Bowel sounds are normal. There is no distension.     Palpations: Abdomen is soft. There is no mass.     Tenderness: There is no abdominal tenderness.  Musculoskeletal:        General: Tenderness present. Normal range of motion.     Cervical back: Normal range of motion and neck supple.     Right lower leg: No edema.     Left lower leg: No edema.     Comments: There is mild tenderness to palpation over the left mid thigh but totally normal range of motion of the hip knee and ankle on the left side.  She can straight leg raise with minimal difficulty.  There is no obvious bruising to the leg.  The joints are supple to passive range of motion.  There is mild tenderness over the left anterior chest  Lymphadenopathy:     Cervical: No cervical adenopathy.  Skin:    General: Skin is warm and dry.     Findings: No erythema or rash.  Neurological:     Mental Status: She is alert.     Coordination: Coordination normal.  Psychiatric:        Behavior: Behavior normal.     (all labs ordered are listed, but only abnormal results are displayed) Labs Reviewed  CBC WITH DIFFERENTIAL/PLATELET - Abnormal; Notable for the following components:      Result Value   WBC 11.7 (*)    Hemoglobin 15.8 (*)    MCH 35.0 (*)    Platelets 149 (*)    Neutro Abs 10.2 (*)    All other components within normal limits  COMPREHENSIVE METABOLIC PANEL WITH GFR - Abnormal; Notable for the following components:   Sodium 126 (*)    Chloride 88 (*)    Glucose, Bld 155 (*)    Total Bilirubin 1.4 (*)    All other components within normal limits  PRO BRAIN NATRIURETIC PEPTIDE - Abnormal; Notable for the following components:   Pro Brain Natriuretic Peptide 1,604.0 (*)     All other components within normal limits  BLOOD GAS, VENOUS - Abnormal; Notable for the following components:   pO2, Ven <31 (*)    Bicarbonate 30.2 (*)    Acid-Base Excess 3.1 (*)    All other components within normal limits  TROPONIN T, HIGH SENSITIVITY -  Abnormal; Notable for the following components:   Troponin T High Sensitivity 26 (*)    All other components within normal limits    EKG: EKG Interpretation Date/Time:  Thursday June 18 2024 16:20:04 EST Ventricular Rate:  111 PR Interval:  166 QRS Duration:  159 QT Interval:  335 QTC Calculation: 456 R Axis:   74  Text Interpretation: Sinus tachycardia Ventricular premature complex Aberrant complex Biatrial enlargement Nonspecific intraventricular conduction delay Abnormal T, consider ischemia, lateral leads Since last tracing rate faster Confirmed by Cleotilde Rogue ([id number]) on 06/18/2024 4:29:52 PM  Radiology: No results found.   .Critical Care  Performed by: Cleotilde Rogue, MD Authorized by: Cleotilde Rogue, MD   Critical care provider statement:    Critical care time (minutes):  45   Critical care time was exclusive of:  Separately billable procedures and treating other patients and teaching time   Critical care was necessary to treat or prevent imminent or life-threatening deterioration of the following conditions:  Respiratory failure and cardiac failure   Critical care was time spent personally by me on the following activities:  Development of treatment plan with patient or surrogate, discussions with consultants, evaluation of patient's response to treatment, examination of patient, obtaining history from patient or surrogate, review of old charts, re-evaluation of patient's condition, pulse oximetry, ordering and review of radiographic studies, ordering and review of laboratory studies and ordering and performing treatments and interventions   I assumed direction of critical care for this patient from another provider  in my specialty: no     Care discussed with: admitting provider   Comments:          Medications Ordered in the ED  albuterol  (PROVENTIL ) (2.5 MG/3ML) 0.083% nebulizer solution 10 mg (10 mg Nebulization New Bag/Given 06/18/24 1705)  furosemide  (LASIX ) injection 40 mg (has no administration in time range)  albuterol  (PROVENTIL ) (2.5 MG/3ML) 0.083% nebulizer solution 10 mg (has no administration in time range)  methylPREDNISolone  sodium succinate (SOLU-MEDROL ) 125 mg/2 mL injection 125 mg (125 mg Intravenous Given 06/18/24 1648)  ketorolac  (TORADOL ) 30 MG/ML injection 30 mg (30 mg Intravenous Given 06/18/24 1648)                                    Medical Decision Making Amount and/or Complexity of Data Reviewed Labs: ordered. Radiology: ordered.  Risk Prescription drug management.   This patient has had a fall, she has had some possible trauma to the left leg and the left ribs, will check x-ray to make sure there is no pneumothorax.  She will need evaluation for the cause of her shortness of breath, her oxygen is 89 to 90% on room air, she is tachypneic and wheezing and despite having a history of no COPD I think that she probably does given her tobacco use and progressive dyspnea on exertion.  Medical records reviewed showing that the patient had an echocardiogram last year which showed an ejection fraction of 55% though she had been as low as 40% in the past.  Meds / Interventions: while in the ED the patient received the following: Albuterol  continuous nebulizer, Solu-Medrol , Toradol  The response to the interventions was that the patient improved  EKG: Sinus tachycardia Cardiac monitoring: Sinus tachycardia  Labs:  I  personally viewed and interpreted the labs which show troponin is low normal, BNP is very elevated consistent with heart failure, slight leukocytosis, hyponatremia present as well  Radiology Imaging: I personally viewed the images of the ordered radiographic  studies and find severe hyperexpansion of both lungs consistent with advanced COPD and emphysema, no obvious fracture of the leg or hip I agree with the radiologist interpretation as well  Consultations: I discussed the case with hospitalist, they recommend admission   The patient is critically ill from respiratory failure and hypoxia, likely combination of COPD and CHF,     Final diagnoses:  Acute respiratory failure with hypoxia (HCC)  Acute on chronic congestive heart failure, unspecified heart failure type (HCC)  Hyponatremia  Contusion of left lower extremity, initial encounter    ED Discharge Orders     None          Cleotilde Rogue, MD 06/18/24 1712

## 2024-06-19 ENCOUNTER — Inpatient Hospital Stay (HOSPITAL_COMMUNITY)

## 2024-06-19 DIAGNOSIS — J441 Chronic obstructive pulmonary disease with (acute) exacerbation: Secondary | ICD-10-CM

## 2024-06-19 DIAGNOSIS — J9601 Acute respiratory failure with hypoxia: Secondary | ICD-10-CM | POA: Insufficient documentation

## 2024-06-19 LAB — RESPIRATORY PANEL BY PCR

## 2024-06-19 LAB — BASIC METABOLIC PANEL WITH GFR
Anion gap: 11 (ref 5–15)
BUN: 15 mg/dL (ref 8–23)
CO2: 26 mmol/L (ref 22–32)
Calcium: 9.1 mg/dL (ref 8.9–10.3)
Chloride: 92 mmol/L — ABNORMAL LOW (ref 98–111)
Creatinine, Ser: 0.77 mg/dL (ref 0.44–1.00)
GFR, Estimated: >60 mL/min (ref >60)
Glucose, Bld: 145 mg/dL — ABNORMAL HIGH (ref 70–99)
Potassium: 3.9 mmol/L (ref 3.5–5.1)
Sodium: 129 mmol/L — ABNORMAL LOW (ref 135–145)

## 2024-06-19 LAB — CBC
HCT: 37.5 % (ref 36.0–46.0)
Hemoglobin: 13.4 g/dL (ref 12.0–15.0)
MCH: 34.7 pg — ABNORMAL HIGH (ref 26.0–34.0)
MCHC: 35.7 g/dL (ref 30.0–36.0)
MCV: 97.2 fL (ref 80.0–100.0)
Platelets: 112 K/uL — ABNORMAL LOW (ref 150–400)
RBC: 3.86 MIL/uL — ABNORMAL LOW (ref 3.87–5.11)
RDW: 13.4 % (ref 11.5–15.5)
WBC: 7.4 K/uL (ref 4.0–10.5)
nRBC: 0 % (ref 0.0–0.2)

## 2024-06-19 LAB — RESP PANEL BY RT-PCR (RSV, FLU A&B, COVID)  RVPGX2
Influenza A by PCR: NEGATIVE
Influenza B by PCR: NEGATIVE
Resp Syncytial Virus by PCR: NEGATIVE
SARS Coronavirus 2 by RT PCR: NEGATIVE

## 2024-06-19 LAB — HIV ANTIBODY (ROUTINE TESTING W REFLEX): HIV Screen 4th Generation wRfx: NONREACTIVE

## 2024-06-19 MED ORDER — BUDESONIDE 0.5 MG/2ML IN SUSP
0.5000 mg | Freq: Two times a day (BID) | RESPIRATORY_TRACT | Status: DC
  Administered 2024-06-19 – 2024-06-21 (×5): 0.5 mg via RESPIRATORY_TRACT
  Filled 2024-06-19 (×5): qty 2

## 2024-06-19 MED ORDER — METHYLPREDNISOLONE SODIUM SUCC 40 MG IJ SOLR
40.0000 mg | Freq: Two times a day (BID) | INTRAMUSCULAR | Status: DC
  Administered 2024-06-19 – 2024-06-21 (×4): 40 mg via INTRAVENOUS
  Filled 2024-06-19 (×4): qty 1

## 2024-06-19 MED ORDER — ACETAMINOPHEN 500 MG PO TABS
1000.0000 mg | ORAL_TABLET | Freq: Three times a day (TID) | ORAL | Status: DC
  Administered 2024-06-19 – 2024-06-21 (×6): 1000 mg via ORAL
  Filled 2024-06-19 (×7): qty 2

## 2024-06-19 MED ORDER — ARFORMOTEROL TARTRATE 15 MCG/2ML IN NEBU
15.0000 ug | INHALATION_SOLUTION | Freq: Two times a day (BID) | RESPIRATORY_TRACT | Status: DC
  Administered 2024-06-19 – 2024-06-21 (×5): 15 ug via RESPIRATORY_TRACT
  Filled 2024-06-19 (×5): qty 2

## 2024-06-19 MED ORDER — SODIUM CHLORIDE 0.9 % IV SOLN: INTRAVENOUS | Status: DC

## 2024-06-19 MED ORDER — OXYCODONE HCL 5 MG PO TABS
5.0000 mg | ORAL_TABLET | Freq: Four times a day (QID) | ORAL | Status: DC | PRN
  Administered 2024-06-19 – 2024-06-21 (×4): 5 mg via ORAL
  Filled 2024-06-19 (×4): qty 1

## 2024-06-19 NOTE — Evaluation (Signed)
 Physical Therapy Evaluation Patient Details Name: NEGIN HEGG MRN: [id number] DOB: January 21, 1956 Today's Date: 06/19/2024  History of Present Illness  Brandi Rowe is a 68 y.o. female with medical history significant for atrial fibrillation, gout, small bowel obstruction, tobacco abuse.  Patient presented to the ED with complaints of a fall last night.  Patient took a month-long vacation to [place] to spend time with family with plans to come after Thanksgiving.  Patient was stepping off the trailer onto a stool and she tripped and fell onto the floor landing on her left side.  She has since not been able to bear weight on the leg.  She reports ongoing progressive difficulty breathing for several weeks to months, with intermittent productive cough.  No chest pain.  No lower extremity swelling.  She smokes cigarettes.  No prior diagnosis of lung disease.   Clinical Impression  Patient demonstrates good return for bed mobility, transferring to chair and commode in bathroom without loss balance using RW. Patient able to take a few steps without AD, but left hip increases with weight bearing and safer using RW. Patient tolerated sitting up in chair after therapy. Patient will benefit from continued skilled physical therapy in hospital and recommended venue below to increase strength, balance, endurance for safe ADLs and gait.          If plan is discharge home, recommend the following: A little help with walking and/or transfers;A little help with bathing/dressing/bathroom;Help with stairs or ramp for entrance;Assistance with cooking/housework;Assist for transportation   Can travel by private vehicle        Equipment Recommendations Rolling walker (2 wheels)  Recommendations for Other Services       Functional Status Assessment Patient has had a recent decline in their functional status and demonstrates the ability to make significant improvements in function in a reasonable and  predictable amount of time.     Precautions / Restrictions Precautions Precautions: Fall Recall of Precautions/Restrictions: Intact Restrictions Weight Bearing Restrictions Per Provider Order: No      Mobility  Bed Mobility Overal bed mobility: Modified Independent                  Transfers Overall transfer level: Modified independent                      Ambulation/Gait Ambulation/Gait assistance: Supervision, Contact guard assist Gait Distance (Feet): 65 Feet Assistive device: Rolling walker (2 wheels) Gait Pattern/deviations: Decreased step length - right, Decreased step length - left, Decreased stance time - left, Decreased stride length, Antalgic Gait velocity: decreased     General Gait Details: slow labored movement with limited weightbearing on LLE due to increasing hip pain, no loss of balance, limited mostly due to fatigue  Stairs            Wheelchair Mobility     Tilt Bed    Modified Rankin (Stroke Patients Only)       Balance Overall balance assessment: Needs assistance Sitting-balance support: Feet unsupported, No upper extremity supported Sitting balance-Leahy Scale: Good Sitting balance - Comments: seated at EOB   Standing balance support: During functional activity, No upper extremity supported Standing balance-Leahy Scale: Poor Standing balance comment: fari/good using RW                             Pertinent Vitals/Pain Pain Assessment Pain Assessment: Faces Faces Pain Scale: Hurts even more Pain  Location: left hip, leg Pain Descriptors / Indicators: Grimacing, Guarding, Sore Pain Intervention(s): Limited activity within patient's tolerance, Monitored during session, Repositioned    Home Living Family/patient expects to be discharged to:: Private residence Living Arrangements: Spouse/significant other Available Help at Discharge: Family;Available 24 hours/day Type of Home: House Home Access: Stairs  to enter   Entrance Stairs-Number of Steps: 1 Alternate Level Stairs-Number of Steps: 14 Home Layout: Two level;Able to live on main level with bedroom/bathroom;Full bath on main level Home Equipment: Shower seat      Prior Function Prior Level of Function : Independent/Modified Independent             Mobility Comments: Community ambulation without AD, drives, shops ADLs Comments: Independent     Extremity/Trunk Assessment   Upper Extremity Assessment Upper Extremity Assessment: Overall WFL for tasks assessed    Lower Extremity Assessment Lower Extremity Assessment: Generalized weakness;LLE deficits/detail LLE Deficits / Details: grossly -4/5 LLE: Unable to fully assess due to pain LLE Sensation: WNL LLE Coordination: WNL    Cervical / Trunk Assessment Cervical / Trunk Assessment: Normal  Communication   Communication Communication: No apparent difficulties    Cognition Arousal: Alert Behavior During Therapy: WFL for tasks assessed/performed   PT - Cognitive impairments: No apparent impairments                         Following commands: Intact       Cueing Cueing Techniques: Verbal cues     General Comments      Exercises     Assessment/Plan    PT Assessment Patient needs continued PT services  PT Problem List Decreased strength;Decreased activity tolerance;Decreased balance;Decreased mobility       PT Treatment Interventions DME instruction;Gait training;Stair training;Functional mobility training;Therapeutic activities;Therapeutic exercise;Balance training;Patient/family education    PT Goals (Current goals can be found in the Care Plan section)  Acute Rehab PT Goals Patient Stated Goal: return home with family to assist PT Goal Formulation: With patient Time For Goal Achievement: 06/23/24 Potential to Achieve Goals: Good    Frequency Min 3X/week     Co-evaluation               AM-PAC PT 6 Clicks Mobility  Outcome  Measure Help needed turning from your back to your side while in a flat bed without using bedrails?: None Help needed moving from lying on your back to sitting on the side of a flat bed without using bedrails?: None Help needed moving to and from a bed to a chair (including a wheelchair)?: A Little Help needed standing up from a chair using your arms (e.g., wheelchair or bedside chair)?: A Little Help needed to walk in hospital room?: A Little Help needed climbing 3-5 steps with a railing? : A Lot 6 Click Score: 19    End of Session   Activity Tolerance: Patient tolerated treatment well;Patient limited by fatigue;Patient limited by pain Patient left: in chair;with call bell/phone within reach Nurse Communication: Mobility status PT Visit Diagnosis: Unsteadiness on feet (R26.81);Other abnormalities of gait and mobility (R26.89);Muscle weakness (generalized) (M62.81);Pain Pain - Right/Left: Left Pain - part of body: Hip    Time: 8889-8875 PT Time Calculation (min) (ACUTE ONLY): 14 min   Charges:   PT Evaluation $PT Eval Low Complexity: 1 Low PT Treatments $Therapeutic Activity: 8-22 mins           5:27 PM, 06/19/24 Lynwood Music, MPT Physical Therapist with [facility]  [phone] office [phone] mobile phone

## 2024-06-19 NOTE — Progress Notes (Signed)
  Pt agreeable to plan for OPPT. Referral made to OP Rehab office in [place].    06/19/24 1146  TOC Brief Assessment  Insurance and Status Reviewed  Patient has primary care physician Yes  Home environment has been reviewed From home  Prior level of function: Independent  Prior/Current Home Services No current home services  Social Drivers of Health Review SDOH reviewed no interventions necessary  Readmission risk has been reviewed Yes  Transition of care needs transition of care needs identified, TOC will continue to follow

## 2024-06-19 NOTE — Progress Notes (Signed)
 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  She was at the beach over the last month.  On 06/17/24 evening, she walked out of her trailer she tripped over a stepping stool landing on her left side injuring her left thigh her left anterior ribs. She also notes that for the last month she has been increasingly short of breath with exertion, has noticed some wheezing, and chest congestion.  She denies any swelling of her legs or orthopnea.   In the ED, the patient received  Solu-Medrol , bronchodilators, Toradol  were given. Labs showed WBC 11.7, hemoglobin 15.8, platelets 149.  Sodium 126, potassium 4.9, bicarbonate 26, serum creatinine 0.77.  AST 41, ALT 32, alk phosphatase 94, total bilirubin 1.4.  proBNP 1604.  Troponin 26>> 50.  Chest x-ray showed hyperinflation with emphysematous changes with peribronchial thickening.  X-ray of the left femur was negative.  X-ray of the left knee was negative.  X-ray of the left pelvis was negative.   She had recent hospitalization in September 2024 for A-fib with RVR where she underwent TEE guided DCCV with successful conversion to NSR.  LVEF improved after DCCV in September 2024. Repeat echocardiogram in October 2024 showed LVEF 55%. Not on GDMT. Compensated    Assessment/Plan: Acute respiratory failure with hypoxia and hypercarbia - Initially presented with oxygen saturation 83% on room air and dyspnea - Placed on 4 L nasal cannula initially - Wean oxygen as tolerated for saturation greater than 92% - Secondary COPD exacerbation - VBG--7.35/55/<31/30  COPD exacerbation - Start Brovana - Start  Pulmicort - Continue IV Solu-Medrol  - Continue DuoNebs - Check COVID/RSV/flu  Left leg pain/mechanical fall - X-rays of the left femur, left knee, left pelvis negative for fracture or dislocation - The patient has no pain to passive hip flexion and hip extension and no pain with internal and external rotation of the left hip - PT evaluation -Start acetaminophen  around-the-clock -Oxycodone  as needed pain  Hyponatremia -Appears to be chronic -Secondary to poor solute intake and volume depletion  Paroxysmal atrial fibrillation -Currently in sinus rhythm -Continue apixaban  -Continue metoprolol  tartrate  Chronic HFimpEF - 05/14/2023 echo EF 55%, no WMA, grade 1 DD, normal RV of -04/04/2023 echo EF 45-50%, global HK, normal RVF, PASP 43.9 -Clinically euvolemic -She is on furosemide  20 mg as needed  Pulmonary nodule -Incidental finding on chest x-ray -CT chest  Tobacco abuse -Tobacco cessation discussed -Patient has nearly 50-pack-year history  Elevated troponin - No chest pain presently - Secondary to demand ischemia - Personally reviewed EKG--sinus rhythm, nonspecific ST changes     Family Communication:  no Family at bedside  Consultants:  none  Code Status:  FULL   DVT Prophylaxis:  apixaban    Procedures: As Listed in Progress Note Above  Antibiotics: Azithro 11/27>>    Subjective: Patient states that she is still having some shortness of breath on minimal exertion.  She denies any hemoptysis.  She denies any vomiting but has some nausea.  She denies any chest pain, abdominal pain, diarrhea, hematochezia, melena.  There is no fevers or chills.  Objective: Vitals:   06/18/24 1949 06/18/24 1953 06/18/24 2315 06/19/24 0254  BP:  125/78  135/83 (!) 129/91  Pulse:  (!) 116 90   Resp:  18 18 18   Temp:  98.2 F (36.8 C) 98.5 F (36.9 C) 98.2 F (36.8 C)  TempSrc:  Oral Oral Oral  SpO2: 92% 92% 92% 100%  Weight: 53.8 kg     Height: 5' 4 (1.626 m)        Intake/Output Summary (Last 24 hours) at 06/19/2024 0857 Last data filed at 06/19/2024 0526 Gross per 24 hour  Intake 780.46 ml  Output --  Net 780.46 ml   Weight change:  Exam:  General:  Pt is alert, follows commands appropriately, not in acute distress HEENT: No icterus, No thrush, No neck mass, [place]/AT Cardiovascular: RRR, S1/S2, no rubs, no gallops Respiratory: Diminished breath sounds.  Bilateral rales.  Bibasilar wheeze. Abdomen: Soft/+BS, non tender, non distended, no guarding Extremities: No edema, No lymphangitis, No petechiae, No rashes, no synovitis; no pain to left hip flexion and extension.  No pain to left hip internal and external rotation.   Data Reviewed: I have personally reviewed following labs and imaging studies Basic Metabolic Panel: Recent Labs  Lab 06/18/24 1620 06/19/24 0436  NA 126* 129*  K 4.9 3.9  CL 88* 92*  CO2 26 26  GLUCOSE 155* 145*  BUN 12 15  CREATININE 0.77 0.77  CALCIUM  9.4 9.1   Liver Function Tests: Recent Labs  Lab 06/18/24 1620  AST 41  ALT 32  ALKPHOS 94  BILITOT 1.4*  PROT 7.7  ALBUMIN 4.6   No results for input(s): LIPASE, AMYLASE in the last 168 hours. No results for input(s): AMMONIA in the last 168 hours. Coagulation Profile: No results for input(s): INR, PROTIME in the last 168 hours. CBC: Recent Labs  Lab 06/18/24 1620 06/19/24 0436  WBC 11.7* 7.4  NEUTROABS 10.2*  --   HGB 15.8* 13.4  HCT 44.8 37.5  MCV 99.1 97.2  PLT 149* 112*   Cardiac Enzymes: No results for input(s): CKTOTAL, CKMB, CKMBINDEX, TROPONINI in the last 168 hours. BNP: Invalid input(s): POCBNP CBG: No results for input(s): GLUCAP in the last 168 hours. HbA1C: No results for input(s): HGBA1C in the last 72 hours. Urine analysis:    Component Value Date/Time   COLORURINE YELLOW 04/03/2023 1850   APPEARANCEUR CLEAR 04/03/2023 1850   APPEARANCEUR Clear 07/26/2021 1003   LABSPEC 1.020 04/03/2023 1850    PHURINE 5.0 04/03/2023 1850   GLUCOSEU NEGATIVE 04/03/2023 1850   HGBUR NEGATIVE 04/03/2023 1850   BILIRUBINUR NEGATIVE 04/03/2023 1850   BILIRUBINUR Negative 07/26/2021 1003   KETONESUR 5 (A) 04/03/2023 1850   PROTEINUR 30 (A) 04/03/2023 1850   NITRITE NEGATIVE 04/03/2023 1850   LEUKOCYTESUR TRACE (A) 04/03/2023 1850   Sepsis Labs: @LABRCNTIP (procalcitonin:4,lacticidven:4) )No results found for this or any previous visit (from the past 240 hours).   Scheduled Meds:  apixaban   5 mg Oral BID   arformoterol  15 mcg Nebulization BID   azithromycin   500 mg Oral Daily   budesonide (PULMICORT) nebulizer solution  0.5 mg Nebulization BID   ipratropium-albuterol   3 mL Nebulization TID   methylPREDNISolone  (SOLU-MEDROL ) injection  60 mg Intravenous Q12H   Followed by   NOREEN ON 06/20/2024] predniSONE   40 mg Oral Q breakfast   metoprolol  tartrate  12.5 mg Oral BID   Continuous Infusions:  sodium chloride  75 mL/hr at 06/19/24 9473    Procedures/Studies: DG Chest Port 1 View Result Date: 06/18/2024 EXAM: 1 VIEW(S) XRAY OF THE CHEST 06/18/2024 05:09:00 PM COMPARISON: Comparison with  04/03/2023. CLINICAL HISTORY: [id number] Trauma [id number]. Patient fell yesterday. FINDINGS: LUNGS AND PLEURA: Emphysematous changes in the lungs. Peribronchial thickening with diffuse interstitial pattern likely due to chronic bronchitis and fibrosis. No superimposed infiltration or atelectasis. No pleural effusion or pneumothorax. Suggestion of small developing nodule in the right upper lobe projecting over the posterior 7th rib. Suggest CT for further evaluation. HEART AND MEDIASTINUM: Mild cardiac enlargement. Calcification of the aorta. BONES AND SOFT TISSUES: Visualized bones are nondisplaced. IMPRESSION: 1. No acute traumatic cardiopulmonary abnormality identified. 2. Visualized osseous structures are nondisplaced. 3. Suggestion of a small developing right upper-lobe pulmonary nodule; recommend chest CT for further  evaluation. 4. Emphysematous changes with peribronchial thickening and diffuse interstitial pattern, likely due to chronic bronchitis and fibrosis. Electronically signed by: Elsie Gravely MD 06/18/2024 05:20 PM EST RP Workstation: [id number]   DG Pelvis 1-2 Views Result Date: 06/18/2024 CLINICAL DATA:  Clemens yesterday.  Left hip/pelvic pain. EXAM: PELVIS - 1-2 VIEW COMPARISON:  07/18/2021. FINDINGS: No fracture or bone lesion. Hip joints, SI joints and pubic symphysis are normally spaced and aligned. Skeletal structures are demineralized. Bowel anastomosis staple line projects in the central pelvis. IMPRESSION: No fracture or dislocation.  No acute finding. Electronically Signed   By: Alm Parkins M.D.   On: 06/18/2024 17:16   DG Knee Complete 4 Views Left Result Date: 06/18/2024 CLINICAL DATA:  Clemens yesterday.  Left knee pain. EXAM: LEFT KNEE - COMPLETE 4+ VIEW COMPARISON:  None Available. FINDINGS: No evidence of fracture, dislocation, or joint effusion. No evidence of arthropathy or other focal bone abnormality. Soft tissues are unremarkable. IMPRESSION: Negative. Electronically Signed   By: Alm Parkins M.D.   On: 06/18/2024 17:15   DG Femur Min 2 Views Left Result Date: 06/18/2024 CLINICAL DATA:  Clemens yesterday.  Left lower extremity pain. EXAM: LEFT FEMUR 2 VIEWS COMPARISON:  None Available. FINDINGS: No fracture or bone lesion. Hip and knee joints are normally spaced and aligned. Soft tissues are unremarkable. IMPRESSION: Negative. Electronically Signed   By: Alm Parkins M.D.   On: 06/18/2024 17:14    Alm Schneider, DO  [facility]  If 7PM-7AM, please contact night-coverage [email or web address] Password [email or web address] 06/19/2024, 8:57 AM   LOS: 1 day

## 2024-06-19 NOTE — Hospital Course (Addendum)
 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  [place] was at the beach over the [place].  On 06/17/24 evening, she walked out of her trailer she tripped over a stepping stool landing on her left side injuring her left thigh her left anterior ribs. She also notes that for the last month she has been increasingly short of breath with exertion, has noticed some wheezing, and chest congestion.  She denies any swelling of her legs or orthopnea.   In the ED, the patient received  Solu-Medrol , bronchodilators, Toradol  were given. Labs showed WBC 11.7, hemoglobin 15.8, platelets 149.  Sodium 126, potassium 4.9, bicarbonate 26, serum creatinine 0.77.  AST 41, ALT 32, alk phosphatase 94, total bilirubin 1.4.  proBNP 1604.  Troponin 26>> 50.  Chest x-ray showed hyperinflation with emphysematous changes with peribronchial thickening.  X-ray of the left femur was negative.  X-ray of the left knee was negative.  X-ray of the left pelvis was negative.   She had recent hospitalization in September 2024 for A-fib with RVR where she underwent TEE guided DCCV with successful conversion to NSR.  LVEF improved after DCCV in September 2024. Repeat echocardiogram in October 2024 showed LVEF 55%. Not on GDMT. Compensated

## 2024-06-19 NOTE — Plan of Care (Signed)

## 2024-06-19 NOTE — Plan of Care (Signed)
  Problem: Acute Rehab PT Goals(only PT should resolve) Goal: Pt Will Go Supine/Side To Sit Outcome: Progressing Flowsheets (Taken 06/19/2024 1728) Pt will go Supine/Side to Sit: Independently Goal: Patient Will Transfer Sit To/From Stand Outcome: Progressing Flowsheets (Taken 06/19/2024 1728) Patient will transfer sit to/from stand: with modified independence Goal: Pt Will Transfer Bed To Chair/Chair To Bed Outcome: Progressing Flowsheets (Taken 06/19/2024 1728) Pt will Transfer Bed to Chair/Chair to Bed: with modified independence Goal: Pt Will Ambulate Outcome: Progressing Flowsheets (Taken 06/19/2024 1728) Pt will Ambulate:  100 feet  with modified independence  with rolling walker   5:28 PM, 06/19/24 Lynwood Music, MPT Physical Therapist with [facility] [phone] office [phone] mobile phone

## 2024-06-20 DIAGNOSIS — J441 Chronic obstructive pulmonary disease with (acute) exacerbation: Secondary | ICD-10-CM | POA: Diagnosis not present

## 2024-06-20 DIAGNOSIS — E871 Hypo-osmolality and hyponatremia: Secondary | ICD-10-CM

## 2024-06-20 DIAGNOSIS — J9601 Acute respiratory failure with hypoxia: Secondary | ICD-10-CM | POA: Diagnosis not present

## 2024-06-20 DIAGNOSIS — I480 Paroxysmal atrial fibrillation: Secondary | ICD-10-CM | POA: Diagnosis not present

## 2024-06-20 LAB — BASIC METABOLIC PANEL WITH GFR
Anion gap: 9 (ref 5–15)
BUN: 18 mg/dL (ref 8–23)
CO2: 26 mmol/L (ref 22–32)
Calcium: 9.2 mg/dL (ref 8.9–10.3)
Chloride: 96 mmol/L — ABNORMAL LOW (ref 98–111)
Creatinine, Ser: 0.77 mg/dL (ref 0.44–1.00)
GFR, Estimated: >60 mL/min (ref >60)
Glucose, Bld: 144 mg/dL — ABNORMAL HIGH (ref 70–99)
Potassium: 4.4 mmol/L (ref 3.5–5.1)
Sodium: 131 mmol/L — ABNORMAL LOW (ref 135–145)

## 2024-06-20 LAB — MAGNESIUM: Magnesium: 1.7 mg/dL (ref 1.7–2.4)

## 2024-06-20 MED ORDER — MAGNESIUM SULFATE 2 GM/50ML IV SOLN
2.0000 g | Freq: Once | INTRAVENOUS | Status: AC
  Administered 2024-06-20: 2 g via INTRAVENOUS
  Filled 2024-06-20: qty 50

## 2024-06-20 NOTE — Progress Notes (Signed)
 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  [place] the [place] over [place].  On 06/17/24 evening, she walked out of her trailer she tripped over a stepping stool landing on her left side injuring her left thigh her left anterior ribs. She also notes that for the last month she has been increasingly short of breath with exertion, has noticed some wheezing, and chest congestion.  She denies any swelling of her legs or orthopnea.   In the ED, the patient received  Solu-Medrol , bronchodilators, Toradol  were given. Labs showed WBC 11.7, hemoglobin 15.8, platelets 149.  Sodium 126, potassium 4.9, bicarbonate 26, serum creatinine 0.77.  AST 41, ALT 32, alk phosphatase 94, total bilirubin 1.4.  proBNP 1604.  Troponin 26>> 50.  Chest x-ray showed hyperinflation with emphysematous changes with peribronchial thickening.  X-ray of the left femur was negative.  X-ray of the left knee was negative.  X-ray of the left pelvis was negative.   She had recent hospitalization in September 2024 for A-fib with RVR where she underwent TEE guided DCCV with successful conversion to NSR.  LVEF improved after DCCV in September 2024. Repeat echocardiogram in October 2024 showed LVEF 55%. Not on GDMT. Compensated    Assessment/Plan: Acute respiratory failure with hypoxia and hypercarbia - Initially presented with oxygen saturation 83% on room air and dyspnea - Placed on 4 L nasal cannula initially - Wean oxygen as tolerated for saturation greater than 92% - Secondary COPD exacerbation - VBG--7.35/55/<31/30   COPD exacerbation - Started Brovana - Started  Pulmicort - Continue IV Solu-Medrol  - Continue DuoNebs - Check COVID/RSV/flu--neg - viral respiratory panel--neg   Left leg pain/mechanical fall - X-rays of the left femur, left knee, left pelvis negative for fracture or dislocation - The patient has no pain to passive hip flexion and hip extension and no pain with internal and external rotation of the left hip - PT evaluation>>outpt PT -Continue acetaminophen  around-the-clock -Oxycodone  as needed pain   Hyponatremia -Appears to be chronic -Secondary to poor solute intake and volume depletion   Paroxysmal atrial fibrillation -Currently in sinus rhythm -Continue apixaban  -Continue metoprolol  tartrate   Chronic HFimpEF - 05/14/2023 echo EF 55%, no WMA, grade 1 DD, normal RV of -04/04/2023 echo EF 45-50%, global HK, normal RVF, PASP 43.9 -Clinically euvolemic -She is on furosemide  20 mg as needed   Pulmonary nodules -Incidental finding on chest x-ray -CT chest--Multiple right upper lobe solid pulmonary nodules measuring up to 8 mm; Extensive emphysema  -outpatient surveillance   Tobacco abuse -Tobacco cessation discussed -Patient has nearly 50-pack-year history   Elevated troponin - No chest pain presently - Secondary to demand ischemia - Personally reviewed EKG--sinus rhythm, nonspecific ST changes         Family Communication:  no Family at bedside   Consultants:  none   Code Status:  FULL    DVT Prophylaxis:  apixaban      Procedures: As Listed in Progress Note Above   Antibiotics: Azithro 11/27>>          Subjective: Pt is breathing better.  She is complaining of left leg/thigh pain with ambulation.  She is able  to bear weight.  Denies n/v/d, abd pain, f/c  Objective: Vitals:   06/20/24 0833 06/20/24 0843 06/20/24 0846 06/20/24 1228  BP: (!) 149/94   129/75  Pulse: 92   94  Resp:    18  Temp:    97.9 F (36.6 C)  TempSrc:    Axillary  SpO2:  98% 100% 94%  Weight:      Height:         Intake/Output Summary (Last 24 hours) at 06/20/2024 1701 Last data filed at 06/20/2024 1229 Gross per 24 hour  Intake 1069.39 ml  Output 400 ml  Net 669.39 ml   Weight change:  Exam:  General:  Pt is alert, follows commands appropriately, not in acute distress HEENT: No icterus, No thrush, No neck mass, [place]/AT Cardiovascular: RRR, S1/S2, no rubs, no gallops Respiratory: bibasilar rales. No wheeze Abdomen: Soft/+BS, non tender, non distended, no guarding Extremities: No edema, No lymphangitis, No petechiae, No rashes, no synovitis;  passive left hip flexion, extension, internal/external rotation without pain   Data Reviewed: I have personally reviewed following labs and imaging studies Basic Metabolic Panel: Recent Labs  Lab 06/18/24 1620 06/19/24 0436 06/20/24 0430  NA 126* 129* 131*  K 4.9 3.9 4.4  CL 88* 92* 96*  CO2 26 26 26   GLUCOSE 155* 145* 144*  BUN 12 15 18   CREATININE 0.77 0.77 0.77  CALCIUM  9.4 9.1 9.2  MG  --   --  1.7   Liver Function Tests: Recent Labs  Lab 06/18/24 1620  AST 41  ALT 32  ALKPHOS 94  BILITOT 1.4*  PROT 7.7  ALBUMIN 4.6   No results for input(s): LIPASE, AMYLASE in the last 168 hours. No results for input(s): AMMONIA in the last 168 hours. Coagulation Profile: No results for input(s): INR, PROTIME in the last 168 hours. CBC: Recent Labs  Lab 06/18/24 1620 06/19/24 0436  WBC 11.7* 7.4  NEUTROABS 10.2*  --   HGB 15.8* 13.4  HCT 44.8 37.5  MCV 99.1 97.2  PLT 149* 112*   Cardiac Enzymes: No results for input(s): CKTOTAL, CKMB, CKMBINDEX, TROPONINI in the last 168 hours. BNP: Invalid input(s): POCBNP CBG: No results for input(s): GLUCAP in the last 168 hours. HbA1C: No results for input(s): HGBA1C in the last 72 hours. Urine analysis:    Component Value Date/Time   COLORURINE YELLOW 04/03/2023 1850   APPEARANCEUR CLEAR 04/03/2023 1850   APPEARANCEUR Clear 07/26/2021 1003   LABSPEC 1.020  04/03/2023 1850   PHURINE 5.0 04/03/2023 1850   GLUCOSEU NEGATIVE 04/03/2023 1850   HGBUR NEGATIVE 04/03/2023 1850   BILIRUBINUR NEGATIVE 04/03/2023 1850   BILIRUBINUR Negative 07/26/2021 1003   KETONESUR 5 (A) 04/03/2023 1850   PROTEINUR 30 (A) 04/03/2023 1850   NITRITE NEGATIVE 04/03/2023 1850   LEUKOCYTESUR TRACE (A) 04/03/2023 1850   Sepsis Labs: @LABRCNTIP (procalcitonin:4,lacticidven:4) ) Recent Results (from the past 240 hours)  Resp panel by RT-PCR (RSV, Flu A&B, Covid) Anterior Nasal Swab     Status: None   Collection Time: 06/19/24 10:15 AM   Specimen: Anterior Nasal Swab  Result Value Ref Range Status   SARS Coronavirus 2 by RT PCR NEGATIVE NEGATIVE Final    Comment: (NOTE) SARS-CoV-2 target nucleic acids are NOT DETECTED.  The SARS-CoV-2 RNA is generally detectable in upper respiratory specimens during the acute phase of infection. The lowest concentration of SARS-CoV-2 viral copies this assay can detect is 138 copies/mL. A negative result does not preclude SARS-Cov-2 infection and should not  be used as the sole basis for treatment or other patient management decisions. A negative result may occur with  improper specimen collection/handling, submission of specimen other than nasopharyngeal swab, presence of viral mutation(s) within the areas targeted by this assay, and inadequate number of viral copies(<138 copies/mL). A negative result must be combined with clinical observations, patient history, and epidemiological information. The expected result is Negative.  Fact Sheet for Patients:  bloggercourse.com  Fact Sheet for Healthcare Providers:  seriousbroker.it  This test is no t yet approved or cleared by the [place]  FDA and  has been authorized for detection and/or diagnosis of SARS-CoV-2 by FDA under an Emergency Use Authorization (EUA). This EUA will remain  in effect (meaning this test can be used) for  the duration of the COVID-19 declaration under Section 564(b)(1) of the Act, 21 U.S.C.section 360bbb-3(b)(1), unless the authorization is terminated  or revoked sooner.       Influenza A by PCR NEGATIVE NEGATIVE Final   Influenza B by PCR NEGATIVE NEGATIVE Final    Comment: (NOTE) The Xpert Xpress SARS-CoV-2/FLU/RSV plus assay is intended as an aid in the diagnosis of influenza from Nasopharyngeal swab specimens and should not be used as a sole basis for treatment. Nasal washings and aspirates are unacceptable for Xpert Xpress SARS-CoV-2/FLU/RSV testing.  Fact Sheet for Patients: bloggercourse.com  Fact Sheet for Healthcare Providers: seriousbroker.it  This test is not yet approved or cleared by the [place]  FDA and has been authorized for detection and/or diagnosis of SARS-CoV-2 by FDA under an Emergency Use Authorization (EUA). This EUA will remain in effect (meaning this test can be used) for the duration of the COVID-19 declaration under Section 564(b)(1) of the Act, 21 U.S.C. section 360bbb-3(b)(1), unless the authorization is terminated or revoked.     Resp Syncytial Virus by PCR NEGATIVE NEGATIVE Final    Comment: (NOTE) Fact Sheet for Patients: bloggercourse.com  Fact Sheet for Healthcare Providers: seriousbroker.it  This test is not yet approved or cleared by the [place]  FDA and has been authorized for detection and/or diagnosis of SARS-CoV-2 by FDA under an Emergency Use Authorization (EUA). This EUA will remain in effect (meaning this test can be used) for the duration of the COVID-19 declaration under Section 564(b)(1) of the Act, 21 U.S.C. section 360bbb-3(b)(1), unless the authorization is terminated or revoked.  Performed at [facility], [address] [place], [place] [address]   Respiratory (~20 pathogens) panel by PCR     Status: None    Collection Time: 06/19/24 10:15 AM   Specimen: Nasopharyngeal Swab; Respiratory  Result Value Ref Range Status   Adenovirus NOT DETECTED NOT DETECTED Final   Coronavirus 229E NOT DETECTED NOT DETECTED Final    Comment: (NOTE) The Coronavirus on the Respiratory Panel, DOES NOT test for the novel  Coronavirus (2019 nCoV)    Coronavirus HKU1 NOT DETECTED NOT DETECTED Final   Coronavirus NL63 NOT DETECTED NOT DETECTED Final   Coronavirus OC43 NOT DETECTED NOT DETECTED Final   Metapneumovirus NOT DETECTED NOT DETECTED Final   Rhinovirus / Enterovirus NOT DETECTED NOT DETECTED Final   Influenza A NOT DETECTED NOT DETECTED Final   Influenza B NOT DETECTED NOT DETECTED Final   Parainfluenza Virus 1 NOT DETECTED NOT DETECTED Final   Parainfluenza Virus 2 NOT DETECTED NOT DETECTED Final   Parainfluenza Virus 3 NOT DETECTED NOT DETECTED Final   Parainfluenza Virus 4 NOT DETECTED NOT DETECTED Final   Respiratory Syncytial Virus NOT DETECTED NOT DETECTED Final  Bordetella pertussis NOT DETECTED NOT DETECTED Final   Bordetella Parapertussis NOT DETECTED NOT DETECTED Final   Chlamydophila pneumoniae NOT DETECTED NOT DETECTED Final   Mycoplasma pneumoniae NOT DETECTED NOT DETECTED Final    Comment: Performed at [facility], [address] [place], [place] [address]     Scheduled Meds:  acetaminophen   1,000 mg Oral Q8H   apixaban   5 mg Oral BID   arformoterol  15 mcg Nebulization BID   budesonide (PULMICORT) nebulizer solution  0.5 mg Nebulization BID   methylPREDNISolone  (SOLU-MEDROL ) injection  40 mg Intravenous Q12H   metoprolol  tartrate  12.5 mg Oral BID   Continuous Infusions:  magnesium  sulfate bolus IVPB 2 g (06/20/24 1646)    Procedures/Studies: CT CHEST WO CONTRAST Result Date: 06/19/2024 EXAM: CT CHEST WITHOUT CONTRAST 06/19/2024 10:37:00 AM TECHNIQUE: CT of the chest was performed without the administration of intravenous contrast. Multiplanar reformatted images are  provided for review. Automated exposure control, iterative reconstruction, and/or weight based adjustment of the mA/kV was utilized to reduce the radiation dose to as low as reasonably achievable. COMPARISON: None available. CLINICAL HISTORY: abnormal CXR, pulmonary nodule, chronic tobacco use FINDINGS: MEDIASTINUM: Heart and pericardium are unremarkable. The central airways are clear. LYMPH NODES: No mediastinal lymphadenopathy. No hilar or axillary lymphadenopathy. LUNGS AND PLEURA: Nodule within the right upper lobe measures 8 mm on image 56 of series 4. Smaller right upper lobe nodule measuring 7 mm on image 51. Peripheral right upper nodule measuring 5 mm on image 33 of series 4. Extensive centrilobular emphysema in the upper lobes. No focal consolidation or pulmonary edema. No pleural effusion or pneumothorax. SOFT TISSUES/BONES: No acute abnormality of the bones or soft tissues. UPPER ABDOMEN: Limited images of the upper abdomen demonstrates no acute abnormality. IMPRESSION: 1. Multiple right upper lobe solid pulmonary nodules measuring up to 8 mm; recommend non-contrast chest CT at 36 months, then consider an additional non-contrast chest CT at 1824 months, following Fleischner Society Guidelines for multiple nodules with the most suspicious nodule 68 mm. 2. Extensive emphysema; consider evaluation for eligibility for a low-dose CT lung cancer screening program. 3. These results will be called to the ordering clinician or representative by the radiologist assistant, and communication documented in the pacs or clario dashboard Electronically signed by: Norleen Boxer MD 06/19/2024 03:58 PM EST RP Workstation: [id number]   DG Chest Port 1 View Result Date: 06/18/2024 EXAM: 1 VIEW(S) XRAY OF THE CHEST 06/18/2024 05:09:00 PM COMPARISON: Comparison with 04/03/2023. CLINICAL HISTORY: [id number] Trauma [id number]. Patient fell yesterday. FINDINGS: LUNGS AND PLEURA: Emphysematous changes in the lungs. Peribronchial  thickening with diffuse interstitial pattern likely due to chronic bronchitis and fibrosis. No superimposed infiltration or atelectasis. No pleural effusion or pneumothorax. Suggestion of small developing nodule in the right upper lobe projecting over the posterior 7th rib. Suggest CT for further evaluation. HEART AND MEDIASTINUM: Mild cardiac enlargement. Calcification of the aorta. BONES AND SOFT TISSUES: Visualized bones are nondisplaced. IMPRESSION: 1. No acute traumatic cardiopulmonary abnormality identified. 2. Visualized osseous structures are nondisplaced. 3. Suggestion of a small developing right upper-lobe pulmonary nodule; recommend chest CT for further evaluation. 4. Emphysematous changes with peribronchial thickening and diffuse interstitial pattern, likely due to chronic bronchitis and fibrosis. Electronically signed by: Elsie Gravely MD 06/18/2024 05:20 PM EST RP Workstation: [id number]   DG Pelvis 1-2 Views Result Date: 06/18/2024 CLINICAL DATA:  Clemens yesterday.  Left hip/pelvic pain. EXAM: PELVIS - 1-2 VIEW COMPARISON:  07/18/2021. FINDINGS: No fracture or bone lesion. Hip  joints, SI joints and pubic symphysis are normally spaced and aligned. Skeletal structures are demineralized. Bowel anastomosis staple line projects in the central pelvis. IMPRESSION: No fracture or dislocation.  No acute finding. Electronically Signed   By: Alm Parkins M.D.   On: 06/18/2024 17:16   DG Knee Complete 4 Views Left Result Date: 06/18/2024 CLINICAL DATA:  Clemens yesterday.  Left knee pain. EXAM: LEFT KNEE - COMPLETE 4+ VIEW COMPARISON:  None Available. FINDINGS: No evidence of fracture, dislocation, or joint effusion. No evidence of arthropathy or other focal bone abnormality. Soft tissues are unremarkable. IMPRESSION: Negative. Electronically Signed   By: Alm Parkins M.D.   On: 06/18/2024 17:15   DG Femur Min 2 Views Left Result Date: 06/18/2024 CLINICAL DATA:  Clemens yesterday.  Left lower extremity  pain. EXAM: LEFT FEMUR 2 VIEWS COMPARISON:  None Available. FINDINGS: No fracture or bone lesion. Hip and knee joints are normally spaced and aligned. Soft tissues are unremarkable. IMPRESSION: Negative. Electronically Signed   By: Alm Parkins M.D.   On: 06/18/2024 17:14    Alm Schneider, DO  [facility]  If 7PM-7AM, please contact night-coverage [email or web address] Password [email or web address] 06/20/2024, 5:01 PM   LOS: 2 days

## 2024-06-20 NOTE — Plan of Care (Signed)

## 2024-06-21 DIAGNOSIS — I480 Paroxysmal atrial fibrillation: Secondary | ICD-10-CM | POA: Diagnosis not present

## 2024-06-21 DIAGNOSIS — J9601 Acute respiratory failure with hypoxia: Secondary | ICD-10-CM | POA: Diagnosis not present

## 2024-06-21 DIAGNOSIS — J9602 Acute respiratory failure with hypercapnia: Secondary | ICD-10-CM | POA: Diagnosis not present

## 2024-06-21 DIAGNOSIS — J441 Chronic obstructive pulmonary disease with (acute) exacerbation: Secondary | ICD-10-CM | POA: Diagnosis not present

## 2024-06-21 LAB — BASIC METABOLIC PANEL WITH GFR
Anion gap: 9 (ref 5–15)
BUN: 19 mg/dL (ref 8–23)
CO2: 26 mmol/L (ref 22–32)
Calcium: 9.2 mg/dL (ref 8.9–10.3)
Chloride: 98 mmol/L (ref 98–111)
Creatinine, Ser: 0.72 mg/dL (ref 0.44–1.00)
GFR, Estimated: >60 mL/min (ref >60)
Glucose, Bld: 136 mg/dL — ABNORMAL HIGH (ref 70–99)
Potassium: 4.2 mmol/L (ref 3.5–5.1)
Sodium: 133 mmol/L — ABNORMAL LOW (ref 135–145)

## 2024-06-21 LAB — CBC
HCT: 36.1 % (ref 36.0–46.0)
Hemoglobin: 12.7 g/dL (ref 12.0–15.0)
MCH: 34.3 pg — ABNORMAL HIGH (ref 26.0–34.0)
MCHC: 35.2 g/dL (ref 30.0–36.0)
MCV: 97.6 fL (ref 80.0–100.0)
Platelets: 121 K/uL — ABNORMAL LOW (ref 150–400)
RBC: 3.7 MIL/uL — ABNORMAL LOW (ref 3.87–5.11)
RDW: 13.5 % (ref 11.5–15.5)
WBC: 10.9 K/uL — ABNORMAL HIGH (ref 4.0–10.5)
nRBC: 0 % (ref 0.0–0.2)

## 2024-06-21 LAB — MAGNESIUM: Magnesium: 2.3 mg/dL (ref 1.7–2.4)

## 2024-06-21 MED ORDER — PREDNISONE 20 MG PO TABS: 50.0000 mg | ORAL_TABLET | Freq: Every day | ORAL | Status: DC

## 2024-06-21 MED ORDER — METOPROLOL TARTRATE 25 MG PO TABS: 25.0000 mg | ORAL_TABLET | Freq: Two times a day (BID) | ORAL | 2 refills | Status: AC

## 2024-06-21 MED ORDER — PREDNISONE 10 MG PO TABS: 50.0000 mg | ORAL_TABLET | Freq: Every day | ORAL | 0 refills | Status: DC

## 2024-06-21 MED ORDER — OXYCODONE HCL 5 MG PO TABS: 5.0000 mg | ORAL_TABLET | Freq: Four times a day (QID) | ORAL | 0 refills | Status: DC | PRN

## 2024-06-21 MED ORDER — METHOCARBAMOL 500 MG PO TABS: 500.0000 mg | ORAL_TABLET | Freq: Three times a day (TID) | ORAL | 0 refills | Status: DC | PRN

## 2024-06-21 MED ORDER — ACETAMINOPHEN 500 MG PO TABS: 1000.0000 mg | ORAL_TABLET | Freq: Three times a day (TID) | ORAL | Status: DC

## 2024-06-21 NOTE — TOC Transition Note (Signed)
 Transition of Care [facility]) - Discharge Note   Patient Details  Name: Brandi Rowe MRN: [id number] Date of Birth: 06/01/1956  [facility]) CM/SW Contact:  Sharlyne Stabs, RN Phone Number: 06/21/2024, 11:05 AM   Clinical Narrative:   Patient discharging home, need rolling walker to go, CM sent referral information to [facility] with [employer]. Printed order and delivery ticket and left on his desk to complete on Monday. CM delivered to the room. RN discharging patient home, family here to transport.     Final next level of care: Home/Self Care Barriers to Discharge: Barriers Resolved   Patient Goals and CMS Choice Patient states their goals for this hospitalization and ongoing recovery are:: Return home CMS Medicare.gov Compare Post Acute Care list provided to:: Patient        Discharge Placement                    Patient and family notified of of transfer: 06/21/24  Discharge Plan and Services Additional resources added to the After Visit Summary for                  DME Arranged: Walker rolling DME Agency: [employer] Date DME Agency Contacted: 06/21/24 Time DME Agency Contacted: 1104 Representative spoke with at DME Agency: Left Zach a message and signed orders and delivery ticket.            Social Drivers of Health (SDOH) Interventions SDOH Screenings   Food Insecurity: No Food Insecurity (06/18/2024)  Housing: Low Risk  (06/18/2024)  Transportation Needs: No Transportation Needs (06/18/2024)  Utilities: Not At Risk (06/18/2024)  Alcohol  Screen: Low Risk  (05/06/2020)  Depression (PHQ2-9): Low Risk  (05/06/2020)  Physical Activity: Inactive (09/24/2018)  Social Connections: Unknown (06/18/2024)  Stress: Stress Concern Present (09/24/2018)  Tobacco Use: High Risk (06/18/2024)     Readmission Risk Interventions    06/19/2024   11:46 AM  Readmission Risk Prevention Plan  Post Dischage Appt Complete  Medication Screening Complete   Transportation Screening Complete

## 2024-06-21 NOTE — Progress Notes (Signed)
 Provider notified that this patient has gotten herself dressed and states she didn't sleep at all last night, patient is VERY anxious.  Blood pressure was taken after she was running around her room, and preparing herself to go home.  She is adamant that her blood pressure would be fine if she wasn't here.  She is very argumentative, and will not rest for a moment so we can get a resting blood pressure. patients blood pressure is 201/106,

## 2024-06-21 NOTE — Plan of Care (Signed)

## 2024-06-21 NOTE — Discharge Summary (Signed)
 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  [place].  On 06/17/24 evening, she walked out of her trailer she tripped over a stepping stool landing on her left side injuring her left thigh her left anterior ribs. She also notes that for the last month she has been increasingly short of breath with exertion, has noticed some wheezing, and chest congestion.  She denies any swelling of her legs or orthopnea.   In the ED, the patient received  Solu-Medrol , bronchodilators, Toradol  were given. Labs showed WBC 11.7, hemoglobin 15.8, platelets 149.  Sodium 126, potassium 4.9, bicarbonate 26, serum creatinine 0.77.  AST 41, ALT 32, alk phosphatase 94, total bilirubin 1.4.  proBNP 1604.  Troponin 26>> 50.  Chest x-ray showed hyperinflation with emphysematous changes with peribronchial thickening.  X-ray of the left femur was negative.  X-ray of the left knee was negative.  X-ray of the left pelvis was negative.   She had recent hospitalization in September 2024 for A-fib with RVR where she underwent TEE guided DCCV with successful conversion to NSR.  LVEF improved after DCCV in September 2024. Repeat echocardiogram in October 2024 showed LVEF 55%. Not on GDMT. Compensated   Assessment and Plan: Acute respiratory failure with hypoxia and hypercarbia - Initially presented with oxygen saturation 83% on  room air and dyspnea - Placed on 4 L nasal cannula initially - Wean oxygen as tolerated for saturation greater than 92% - Secondary COPD exacerbation - VBG--7.35/55/<31/30 - weaned to RA and remained stable thereafter   COPD exacerbation - Started Brovana  - Started Pulmicort  - Continue IV Solu-Medrol  - Continue DuoNebs - Check COVID/RSV/flu--neg - viral respiratory panel--neg - d/c home with prednisone  taper   Left leg pain/mechanical fall - X-rays of the left femur, left knee, left pelvis negative for fracture or dislocation - The patient has no pain to passive hip flexion and hip extension and no pain with internal and external rotation of the left hip - PT evaluation>>outpt PT -Continue acetaminophen  around-the-clock -Oxycodone  as needed pain -d/c home with robaxin , oxycodone  prn  -she is able to ambulate with manageable pain   Hyponatremia -Appears to be chronic -Secondary to poor solute intake and volume depletion -improved with IVF and po intake   Paroxysmal atrial fibrillation -Currently in sinus rhythm -Continue apixaban  -Continue metoprolol  tartrate   Chronic HFimpEF - 05/14/2023 echo EF 55%, no WMA, grade 1 DD, normal RV of -04/04/2023 echo EF 45-50%, global HK, normal RVF, PASP 43.9 -Clinically euvolemic -She is on furosemide  20 mg as needed   Pulmonary nodules -Incidental finding on chest x-ray -CT chest--Multiple right upper lobe solid pulmonary nodules measuring up to 8 mm; Extensive emphysema  -outpatient surveillance   Tobacco abuse -Tobacco cessation discussed -Patient has nearly 50-pack-year history   Elevated troponin - No chest pain presently - Secondary to demand ischemia - Personally reviewed EKG--sinus rhythm, nonspecific ST changes  Essential HTN -increase metoprolol   to 25 mg bid       Consultants: none Procedures performed: none  Disposition: Home Diet recommendation:  Cardiac diet DISCHARGE MEDICATION: Allergies as of  06/21/2024       Reactions   Dust Mite Extract Other (See Comments)   Grass Extracts [gramineae Pollens] Other (See Comments)   Dust, mold, mites Congestion   Grass Pollen(k-o-r-t-swt Vern) Other (See Comments)   Other Other (See Comments)   3-Hydroxy-3-Methylglutaryl-Coenzyme A Reductase Inhibitor   Statins Other (See Comments)   Stiffness in body        Medication List     TAKE these medications    acetaminophen  500 MG tablet Commonly known as: TYLENOL  Take 2 tablets (1,000 mg total) by mouth every 8 (eight) hours. What changed:  how much to take when to take this reasons to take this   cholecalciferol 25 MCG (1000 UNIT) tablet Commonly known as: VITAMIN D3 Take 1,000 Units by mouth daily.   clonazePAM  1 MG tablet Commonly known as: KLONOPIN  Take 1 tablet (1 mg total) by mouth daily as needed for anxiety. What changed: how much to take   cyanocobalamin  1000 MCG tablet Commonly known as: VITAMIN B12 Take by mouth every other day.   diphenhydrAMINE  25 MG tablet Commonly known as: BENADRYL  Take 25 mg by mouth at bedtime as needed for allergies.   Eliquis  5 MG Tabs tablet Generic drug: apixaban  TAKE 1 TABLET(5 MG) BY MOUTH TWICE DAILY What changed: See the new instructions.   furosemide  20 MG tablet Commonly known as: LASIX  Take 1 tablet (20 mg total) by mouth daily as needed for edema.   levothyroxine  25 MCG tablet Commonly known as: SYNTHROID  Take 1 tablet (25 mcg total) by mouth daily before breakfast.   loperamide  2 MG capsule Commonly known as: IMODIUM  Take 4 mg by mouth as needed for diarrhea or loose stools.   methocarbamol  500 MG tablet Commonly known as: ROBAXIN  Take 1 tablet (500 mg total) by mouth every 8 (eight) hours as needed for muscle spasms.   metoprolol  tartrate 25 MG tablet Commonly known as: LOPRESSOR  Take 1 tablet (25 mg total) by mouth 2 (two) times daily. What changed: See the new instructions.   oxyCODONE  5 MG immediate  release tablet Commonly known as: Oxy IR/ROXICODONE  Take 1 tablet (5 mg total) by mouth every 6 (six) hours as needed for moderate pain (pain score 4-6).   predniSONE  10 MG tablet Commonly known as: DELTASONE  Take 5 tablets (50 mg total) by mouth daily with breakfast. And decrease by one tablet daily Start taking on: June 22, 2024   REFRESH OP Place 1 drop into both eyes daily as needed (dry eyes).               Durable Medical Equipment  (From admission, onward)           Start     Ordered   06/19/24 1730  For home use only DME Walker rolling  Once       Comments: Patient has difficulty walking due to LLE pain, safer using RW with good return for use demonstrated  Question Answer Comment  Walker: With 5 Inch Wheels   Patient needs a walker to treat with the following condition Gait difficulty      06/19/24 1730            Follow-up Information     [facility] Follow up.   Why: Rolling walker delivered  Discharge Exam: Filed Weights   06/18/24 1614 06/18/24 1949  Weight: 54.6 kg 53.8 kg   HEENT:  [place]/AT, No thrush, no icterus CV:  RRR, no rub, no S3, no S4 Lung:  bibasilar rales.  No wheeze Abd:  soft/+BS, NT Ext:  No edema, no lymphangitis, no synovitis, no rash   Condition at discharge: stable  The results of significant diagnostics from this hospitalization (including imaging, microbiology, ancillary and laboratory) are listed below for reference.   Imaging Studies: CT CHEST WO CONTRAST Result Date: 06/19/2024 EXAM: CT CHEST WITHOUT CONTRAST 06/19/2024 10:37:00 AM TECHNIQUE: CT of the chest was performed without the administration of intravenous contrast. Multiplanar reformatted images are provided for review. Automated exposure control, iterative reconstruction, and/or weight based adjustment of the mA/kV was utilized to reduce the radiation dose to as low as reasonably achievable. COMPARISON: None available. CLINICAL  HISTORY: abnormal CXR, pulmonary nodule, chronic tobacco use FINDINGS: MEDIASTINUM: Heart and pericardium are unremarkable. The central airways are clear. LYMPH NODES: No mediastinal lymphadenopathy. No hilar or axillary lymphadenopathy. LUNGS AND PLEURA: Nodule within the right upper lobe measures 8 mm on image 56 of series 4. Smaller right upper lobe nodule measuring 7 mm on image 51. Peripheral right upper nodule measuring 5 mm on image 33 of series 4. Extensive centrilobular emphysema in the upper lobes. No focal consolidation or pulmonary edema. No pleural effusion or pneumothorax. SOFT TISSUES/BONES: No acute abnormality of the bones or soft tissues. UPPER ABDOMEN: Limited images of the upper abdomen demonstrates no acute abnormality. IMPRESSION: 1. Multiple right upper lobe solid pulmonary nodules measuring up to 8 mm; recommend non-contrast chest CT at 36 months, then consider an additional non-contrast chest CT at 1824 months, following Fleischner Society Guidelines for multiple nodules with the most suspicious nodule 68 mm. 2. Extensive emphysema; consider evaluation for eligibility for a low-dose CT lung cancer screening program. 3. These results will be called to the ordering clinician or representative by the radiologist assistant, and communication documented in the pacs or clario dashboard Electronically signed by: Norleen Boxer MD 06/19/2024 03:58 PM EST RP Workstation: [id number]   DG Chest Port 1 View Result Date: 06/18/2024 EXAM: 1 VIEW(S) XRAY OF THE CHEST 06/18/2024 05:09:00 PM COMPARISON: Comparison with 04/03/2023. CLINICAL HISTORY: [id number] Trauma [id number]. Patient fell yesterday. FINDINGS: LUNGS AND PLEURA: Emphysematous changes in the lungs. Peribronchial thickening with diffuse interstitial pattern likely due to chronic bronchitis and fibrosis. No superimposed infiltration or atelectasis. No pleural effusion or pneumothorax. Suggestion of small developing nodule in the right upper lobe  projecting over the posterior 7th rib. Suggest CT for further evaluation. HEART AND MEDIASTINUM: Mild cardiac enlargement. Calcification of the aorta. BONES AND SOFT TISSUES: Visualized bones are nondisplaced. IMPRESSION: 1. No acute traumatic cardiopulmonary abnormality identified. 2. Visualized osseous structures are nondisplaced. 3. Suggestion of a small developing right upper-lobe pulmonary nodule; recommend chest CT for further evaluation. 4. Emphysematous changes with peribronchial thickening and diffuse interstitial pattern, likely due to chronic bronchitis and fibrosis. Electronically signed by: Elsie Gravely MD 06/18/2024 05:20 PM EST RP Workstation: [id number]   DG Pelvis 1-2 Views Result Date: 06/18/2024 CLINICAL DATA:  Clemens yesterday.  Left hip/pelvic pain. EXAM: PELVIS - 1-2 VIEW COMPARISON:  07/18/2021. FINDINGS: No fracture or bone lesion. Hip joints, SI joints and pubic symphysis are normally spaced and aligned. Skeletal structures are demineralized. Bowel anastomosis staple line projects in the central pelvis. IMPRESSION: No fracture or dislocation.  No acute finding. Electronically Signed   By: Alm Avanell HERO.D.  On: 06/18/2024 17:16   DG Knee Complete 4 Views Left Result Date: 06/18/2024 CLINICAL DATA:  Clemens yesterday.  Left knee pain. EXAM: LEFT KNEE - COMPLETE 4+ VIEW COMPARISON:  None Available. FINDINGS: No evidence of fracture, dislocation, or joint effusion. No evidence of arthropathy or other focal bone abnormality. Soft tissues are unremarkable. IMPRESSION: Negative. Electronically Signed   By: Alm Parkins M.D.   On: 06/18/2024 17:15   DG Femur Min 2 Views Left Result Date: 06/18/2024 CLINICAL DATA:  Clemens yesterday.  Left lower extremity pain. EXAM: LEFT FEMUR 2 VIEWS COMPARISON:  None Available. FINDINGS: No fracture or bone lesion. Hip and knee joints are normally spaced and aligned. Soft tissues are unremarkable. IMPRESSION: Negative. Electronically Signed   By: Alm Parkins M.D.   On: 06/18/2024 17:14    Microbiology: Results for orders placed or performed during the hospital encounter of 06/18/24  Resp panel by RT-PCR (RSV, Flu A&B, Covid) Anterior Nasal Swab     Status: None   Collection Time: 06/19/24 10:15 AM   Specimen: Anterior Nasal Swab  Result Value Ref Range Status   SARS Coronavirus 2 by RT PCR NEGATIVE NEGATIVE Final    Comment: (NOTE) SARS-CoV-2 target nucleic acids are NOT DETECTED.  The SARS-CoV-2 RNA is generally detectable in upper respiratory specimens during the acute phase of infection. The lowest concentration of SARS-CoV-2 viral copies this assay can detect is 138 copies/mL. A negative result does not preclude SARS-Cov-2 infection and should not be used as the sole basis for treatment or other patient management decisions. A negative result may occur with  improper specimen collection/handling, submission of specimen other than nasopharyngeal swab, presence of viral mutation(s) within the areas targeted by this assay, and inadequate number of viral copies(<138 copies/mL). A negative result must be combined with clinical observations, patient history, and epidemiological information. The expected result is Negative.  Fact Sheet for Patients:  bloggercourse.com  Fact Sheet for Healthcare Providers:  seriousbroker.it  This test is no t yet approved or cleared by the [place]  FDA and  has been authorized for detection and/or diagnosis of SARS-CoV-2 by FDA under an Emergency Use Authorization (EUA). This EUA will remain  in effect (meaning this test can be used) for the duration of the COVID-19 declaration under Section 564(b)(1) of the Act, 21 U.S.C.section 360bbb-3(b)(1), unless the authorization is terminated  or revoked sooner.       Influenza A by PCR NEGATIVE NEGATIVE Final   Influenza B by PCR NEGATIVE NEGATIVE Final    Comment: (NOTE) The Xpert Xpress  SARS-CoV-2/FLU/RSV plus assay is intended as an aid in the diagnosis of influenza from Nasopharyngeal swab specimens and should not be used as a sole basis for treatment. Nasal washings and aspirates are unacceptable for Xpert Xpress SARS-CoV-2/FLU/RSV testing.  Fact Sheet for Patients: bloggercourse.com  Fact Sheet for Healthcare Providers: seriousbroker.it  This test is not yet approved or cleared by the [place]  FDA and has been authorized for detection and/or diagnosis of SARS-CoV-2 by FDA under an Emergency Use Authorization (EUA). This EUA will remain in effect (meaning this test can be used) for the duration of the COVID-19 declaration under Section 564(b)(1) of the Act, 21 U.S.C. section 360bbb-3(b)(1), unless the authorization is terminated or revoked.     Resp Syncytial Virus by PCR NEGATIVE NEGATIVE Final    Comment: (NOTE) Fact Sheet for Patients: bloggercourse.com  Fact Sheet for Healthcare Providers: seriousbroker.it  This test is not yet approved or cleared by  the [place]  FDA and has been authorized for detection and/or diagnosis of SARS-CoV-2 by FDA under an Emergency Use Authorization (EUA). This EUA will remain in effect (meaning this test can be used) for the duration of the COVID-19 declaration under Section 564(b)(1) of the Act, 21 U.S.C. section 360bbb-3(b)(1), unless the authorization is terminated or revoked.  Performed at [facility], [address], [place], [place] [address]   Respiratory (~20 pathogens) panel by PCR     Status: None   Collection Time: 06/19/24 10:15 AM   Specimen: Nasopharyngeal Swab; Respiratory  Result Value Ref Range Status   Adenovirus NOT DETECTED NOT DETECTED Final   Coronavirus 229E NOT DETECTED NOT DETECTED Final    Comment: (NOTE) The Coronavirus on the Respiratory Panel, DOES NOT test for the novel   Coronavirus (2019 nCoV)    Coronavirus HKU1 NOT DETECTED NOT DETECTED Final   Coronavirus NL63 NOT DETECTED NOT DETECTED Final   Coronavirus OC43 NOT DETECTED NOT DETECTED Final   Metapneumovirus NOT DETECTED NOT DETECTED Final   Rhinovirus / Enterovirus NOT DETECTED NOT DETECTED Final   Influenza A NOT DETECTED NOT DETECTED Final   Influenza B NOT DETECTED NOT DETECTED Final   Parainfluenza Virus 1 NOT DETECTED NOT DETECTED Final   Parainfluenza Virus 2 NOT DETECTED NOT DETECTED Final   Parainfluenza Virus 3 NOT DETECTED NOT DETECTED Final   Parainfluenza Virus 4 NOT DETECTED NOT DETECTED Final   Respiratory Syncytial Virus NOT DETECTED NOT DETECTED Final   Bordetella pertussis NOT DETECTED NOT DETECTED Final   Bordetella Parapertussis NOT DETECTED NOT DETECTED Final   Chlamydophila pneumoniae NOT DETECTED NOT DETECTED Final   Mycoplasma pneumoniae NOT DETECTED NOT DETECTED Final    Comment: Performed at [facility], [address], [place], [place] [address]    Labs: CBC: Recent Labs  Lab 06/18/24 1620 06/19/24 0436 06/21/24 0527  WBC 11.7* 7.4 10.9*  NEUTROABS 10.2*  --   --   HGB 15.8* 13.4 12.7  HCT 44.8 37.5 36.1  MCV 99.1 97.2 97.6  PLT 149* 112* 121*   Basic Metabolic Panel: Recent Labs  Lab 06/18/24 1620 06/19/24 0436 06/20/24 0430 06/21/24 0527  NA 126* 129* 131* 133*  K 4.9 3.9 4.4 4.2  CL 88* 92* 96* 98  CO2 26 26 26 26   GLUCOSE 155* 145* 144* 136*  BUN 12 15 18 19   CREATININE 0.77 0.77 0.77 0.72  CALCIUM  9.4 9.1 9.2 9.2  MG  --   --  1.7 2.3   Liver Function Tests: Recent Labs  Lab 06/18/24 1620  AST 41  ALT 32  ALKPHOS 94  BILITOT 1.4*  PROT 7.7  ALBUMIN 4.6   CBG: No results for input(s): GLUCAP in the last 168 hours.  Discharge time spent: greater than 30 minutes.  Signed: Alm Schneider, MD [facility] 06/21/2024

## 2024-06-22 ENCOUNTER — Telehealth: Payer: Self-pay

## 2024-06-22 NOTE — Transitions of Care (Post Inpatient/ED Visit) (Signed)
   06/22/2024  Name: Brandi Rowe MRN: [id number] DOB: [birth year]  Today's TOC FU Call Status: Today's TOC FU Call Status:: Unsuccessful Call (1st Attempt) Unsuccessful Call (1st Attempt) Date: 06/22/24  Attempted to reach the patient regarding the most recent Inpatient/ED visit.  Follow Up Plan: Additional outreach attempts will be made to reach the patient to complete the Transitions of Care (Post Inpatient/ED visit) call.   Arvin Seip RN, BSN, CCM [employer], Population Health Case Manager Phone: [phone]

## 2024-06-23 ENCOUNTER — Telehealth: Payer: Self-pay | Admitting: *Deleted

## 2024-06-23 NOTE — Transitions of Care (Post Inpatient/ED Visit) (Addendum)
 06/23/2024  Name: Brandi Rowe MRN: [id number] DOB: Jan 23, 1956  Today's TOC FU Call Status: Today's TOC FU Call Status:: Successful TOC FU Call Completed TOC FU Call Complete Date: 06/23/24  Patient's Name and Date of Birth confirmed. Name, DOB  Transition Care Management Follow-up Telephone Call Date of Discharge: 06/21/24 [facility] Type of Discharge: Inpatient Admission Primary Inpatient Discharge Diagnosis:: Acute hypoxic respiratory failure How have you been since you were released from the hospital?: Better Any questions or concerns?: Yes Patient Questions/Concerns:: my leg is still sore Patient Questions/Concerns Addressed: Other: (Patient will contact PCP)  Items Reviewed: Did you receive and understand the discharge instructions provided?: Yes Medications obtained,verified, and reconciled?: Yes (Medications Reviewed) Any new allergies since your discharge?: No Dietary orders reviewed?: No Do you have support at home?: Yes People in Home [RPT]: significant other Name of Support/Comfort Primary Source: Brandi Rowe  Medications Reviewed Today: Medications Reviewed Today   Medications were not reviewed in this encounter     Home Care and Equipment/Supplies: Were Home Health Services Ordered?: NA Any new equipment or medical supplies ordered?: NA  Functional Questionnaire: Do you need assistance with bathing/showering or dressing?: No Do you need assistance with meal preparation?: No Do you need assistance with eating?: No Do you have difficulty maintaining continence: No Do you need assistance with getting out of bed/getting out of a chair/moving?: Yes (uses a walker) Do you have difficulty managing or taking your medications?: No  Follow up appointments reviewed: PCP Follow-up appointment confirmed?: No MD Provider Line [phone] Given: Yes (Patient will call PCP for appointment) Specialist Hospital Follow-up appointment  confirmed?: NA Reason Specialist Follow-Up Not Confirmed: Appointment Sceduled by [facility] Calling Clinician Do you need transportation to your follow-up appointment?: No Do you understand care options if your condition(s) worsen?: Yes-patient verbalized understanding  SDOH Interventions Today    Flowsheet Row Most Recent Value  SDOH Interventions   Food Insecurity Interventions Intervention Not Indicated  Housing Interventions Intervention Not Indicated  Transportation Interventions Intervention Not Indicated, Patient Resources (Friends/Family)  Utilities Interventions Intervention Not Indicated    Current Outpatient Medications on File Prior to Visit  Medication Sig Dispense Refill   acetaminophen  (TYLENOL ) 500 MG tablet Take 2 tablets (1,000 mg total) by mouth every 8 (eight) hours.     apixaban  (ELIQUIS ) 5 MG TABS tablet TAKE 1 TABLET(5 MG) BY MOUTH TWICE DAILY 60 tablet 5   cholecalciferol (VITAMIN D3) 25 MCG (1000 UNIT) tablet Take 1,000 Units by mouth daily.     clonazePAM  (KLONOPIN ) 1 MG tablet Take 1 tablet (1 mg total) by mouth daily as needed for anxiety. (Patient taking differently: Take 0.5 mg by mouth daily as needed for anxiety.) 60 tablet 0   cyanocobalamin  (VITAMIN B12) 1000 MCG tablet Take by mouth every other day.     diphenhydrAMINE  (BENADRYL ) 25 MG tablet Take 25 mg by mouth at bedtime as needed for allergies.     levothyroxine  (SYNTHROID ) 25 MCG tablet Take 1 tablet (25 mcg total) by mouth daily before breakfast. 30 tablet 11   loperamide  (IMODIUM ) 2 MG capsule Take 4 mg by mouth as needed for diarrhea or loose stools.     methocarbamol  (ROBAXIN ) 500 MG tablet Take 1 tablet (500 mg total) by mouth every 8 (eight) hours as needed for muscle spasms. 20 tablet 0   metoprolol  tartrate (LOPRESSOR ) 25 MG tablet Take 1 tablet (25 mg total) by mouth 2 (two) times daily. 60 tablet 2   oxyCODONE  (OXY  IR/ROXICODONE ) 5 MG immediate release tablet Take 1 tablet (5 mg total) by mouth  every 6 (six) hours as needed for moderate pain (pain score 4-6). 15 tablet 0   Polyvinyl Alcohol -Povidone (REFRESH OP) Place 1 drop into both eyes daily as needed (dry eyes).     predniSONE  (DELTASONE ) 10 MG tablet Take 5 tablets (50 mg total) by mouth daily with breakfast. And decrease by one tablet daily 15 tablet 0   furosemide  (LASIX ) 20 MG tablet Take 1 tablet (20 mg total) by mouth daily as needed for edema. (Patient not taking: Reported on 06/23/2024) 30 tablet 3   No current facility-administered medications on file prior to visit.    Discussed and offered 30 day TOC program.  Patient  declined.  The patient has been provided with contact information for the care management team and has been advised to call with any health -related questions or concerns.  The patient verbalized understanding with current plan of care.  The patient is directed to their insurance card regarding availability of benefits coverage  Cathlean Headland BSN RN [facility] Care Management Coordinator [email or web address] Direct Dial: [phone]  Fax: [phone] Website: [email or web address]

## 2024-06-26 ENCOUNTER — Ambulatory Visit: Payer: Self-pay

## 2024-06-26 NOTE — Telephone Encounter (Signed)
 FYI Only or Action Required?: Action required by provider: update on patient condition.  Patient was last seen in primary care on 07/27/2021 by Chandra Harlene LABOR, NP.  Called Nurse Triage reporting Advice Only.  Triage Disposition: Call PCP When Office is Open  Patient/caregiver understands and will follow disposition?: Yes  Copied from CRM #[id number]. Topic: Clinical - Red Word Triage >> Jun 26, 2024 12:56 PM Mercer PEDLAR wrote: Red Word that prompted transfer to Nurse Triage:  [name] Bureau (Partner) stating patient had a fall and has been to hospital but is still currently in pain and about to run out of pain meds. Reason for Disposition  [1] Caller requesting NON-URGENT health information AND [2] PCP's office is the best resource  Answer Assessment - Initial Assessment Questions 1. REASON FOR CALL: What is the main reason for your call? or How can I best help you?    Pt's husband, Abby, contacted clinic stating pt was d/c from hospital 11/30 with instructions to f/u with PCP for labs, MRI and refill of pain medication. Discussed that Dr. Norleen Hurst is not with [facility] but internal medicine. Provided contact number for Dr. Hurst via chart. Discussed pt can always request TOC if she would like to be seen by one of our providers. Voiced appreciation.  Protocols used: Information Only Call - No Triage-A-AH

## 2024-06-29 ENCOUNTER — Other Ambulatory Visit (HOSPITAL_COMMUNITY): Payer: Self-pay | Admitting: Family Medicine

## 2024-06-29 ENCOUNTER — Other Ambulatory Visit (HOSPITAL_COMMUNITY): Payer: Self-pay | Admitting: Internal Medicine

## 2024-06-29 DIAGNOSIS — R911 Solitary pulmonary nodule: Secondary | ICD-10-CM

## 2024-06-29 DIAGNOSIS — Z13820 Encounter for screening for osteoporosis: Secondary | ICD-10-CM

## 2024-06-30 ENCOUNTER — Other Ambulatory Visit (HOSPITAL_COMMUNITY): Payer: Self-pay | Admitting: Family Medicine

## 2024-06-30 DIAGNOSIS — M25552 Pain in left hip: Secondary | ICD-10-CM

## 2024-06-30 DIAGNOSIS — R911 Solitary pulmonary nodule: Secondary | ICD-10-CM

## 2024-07-01 ENCOUNTER — Ambulatory Visit (HOSPITAL_COMMUNITY)
Admission: RE | Admit: 2024-07-01 | Discharge: 2024-07-01 | Disposition: A | Source: Ambulatory Visit | Attending: Family Medicine | Admitting: Family Medicine

## 2024-07-01 DIAGNOSIS — M25552 Pain in left hip: Secondary | ICD-10-CM | POA: Insufficient documentation

## 2024-07-01 MED ORDER — GADOBUTROL 1 MMOL/ML IV SOLN
5.0000 mL | Freq: Once | INTRAVENOUS | Status: AC | PRN
  Administered 2024-07-01: 5 mL via INTRAVENOUS

## 2024-07-08 ENCOUNTER — Encounter: Payer: Self-pay | Admitting: Orthopedic Surgery

## 2024-07-08 ENCOUNTER — Ambulatory Visit: Admitting: Orthopedic Surgery

## 2024-07-08 VITALS — BP 191/106 | HR 97 | Ht 64.0 in | Wt 118.0 lb

## 2024-07-08 DIAGNOSIS — S32592A Other specified fracture of left pubis, initial encounter for closed fracture: Secondary | ICD-10-CM | POA: Diagnosis not present

## 2024-07-08 DIAGNOSIS — S3210XA Unspecified fracture of sacrum, initial encounter for closed fracture: Secondary | ICD-10-CM

## 2024-07-08 DIAGNOSIS — S32512A Fracture of superior rim of left pubis, initial encounter for closed fracture: Secondary | ICD-10-CM

## 2024-07-08 DIAGNOSIS — S322XXA Fracture of coccyx, initial encounter for closed fracture: Secondary | ICD-10-CM | POA: Diagnosis not present

## 2024-07-08 MED ORDER — CYCLOBENZAPRINE HCL 10 MG PO TABS: 10.0000 mg | ORAL_TABLET | Freq: Two times a day (BID) | ORAL | 0 refills | Status: AC | PRN

## 2024-07-08 MED ORDER — OXYCODONE HCL 5 MG PO TABS: 5.0000 mg | ORAL_TABLET | Freq: Four times a day (QID) | ORAL | 0 refills | Status: AC | PRN

## 2024-07-08 NOTE — Progress Notes (Signed)
 New Patient Visit  Summary: Brandi Rowe is a 68 y.o. female with the following: Left sided superior and inferior pubic rami fractures Insufficiency fractures of sacral  Assessment and Plan Assessment & Plan Pelvic and sacral insufficiency fractures MRI confirmed fractures in superior and inferior pubic rami and sacrum. No displacement. Severe pain due to fractures and tissue irritation. Healing expected in 6-8 weeks with protected weight bearing. - Continue walker for protected weight bearing. - Encouraged walking as tolerated to stress bones and aid healing. - Use donut pillow or travel neck pillow to offload sacrum when sitting. - Prescribed oxycodone  and Tylenol  for pain management. - Prescribed cyclobenzaprine  for muscle relaxation. - Follow up in three weeks. - Discussed ongoing management for osteoporosis.  She has been referred by her PCP for continued testing.  Constipation Risk due to medication and decreased mobility. - Start Colace as stool softener. - Consider Dulcolax if needed.     Follow-up: Return in about 3 weeks (around 07/29/2024).  Subjective:  Chief Complaint  Patient presents with   Back Pain    Pt had fall 3 wks ago and went to ER. Pt f/u with PCP and had MRI done. Has pain in the buttocks and wraps around into the groin.      Discussed the use of AI scribe software for clinical note transcription with the patient, who gave verbal consent to proceed.  History of Present Illness Brandi Rowe is a 68 year old female who presents with severe pain following a fall. She was referred by Dr. Shona for evaluation of her pelvic fractures.  [place] and developed severe pelvic pain. Initial x-rays were negative, but MRI showed fractures of the superior and inferior pubic rami and sacral insufficiency fractures. Pain is severe and localized to the groin and buttocks. It makes it very hard to sit up or use her walker, though she  can ambulate slowly once upright.  She has diverticulitis with multiple prior abdominal surgeries and ostomy use. She is worried about intra-abdominal injury from the fall, but bowel function is present though limited, with constipation worsened by pain medications. She takes oxycodone  every six hours, Tylenol , and cyclobenzaprine  every eight hours.  She has atrial fibrillation and notes swelling in her feet, which she links to reduced mobility. There is no numbness or tingling, but her shoes feel tight. She describes a warm sensation with urination and is concerned about a possible urinary tract infection but has not had fever.  She uses a walker for mobility and is distressed by the intensity of pain, which she describes as the worst she has experienced and severe enough to make her cry recently.    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   Dehydration 01/04/2021   Elevated MCV 01/04/2021   Gout    Hypertension    Nausea 01/04/2021   PAF (paroxysmal atrial fibrillation) (HCC)    a. 06/2021 in setting of SBO, HypoK/Mg; b. 06/2021 Echo: EF 50-55%, no rwma, GrI DD, Nl RV size/fxn, RVSP 36.62mmHg. Mod dil RA. Triv MR.   Sepsis (  HCC) 01/05/2021   Tobacco abuse     Past Surgical History:  Procedure Laterality Date   BLADDER REPAIR N/A 05/17/2021   Procedure: Closure of Cystotomy;  Surgeon: Sherrilee Belvie CROME, MD;  Location: [facility];  Service: Urology;  Laterality: N/A;   CARDIOVERSION N/A 04/09/2023   Procedure: CARDIOVERSION;  Surgeon: Stacia Diannah SQUIBB, MD;  Location: [facility];  Service: Cardiovascular;  Laterality: N/A;   CARPAL TUNNEL RELEASE  Right 2002   COLONOSCOPY WITH PROPOFOL  N/A 09/19/2021   Procedure: COLONOSCOPY WITH PROPOFOL ;  Surgeon: Mavis Anes, MD;  Location: [facility];  Service: Gastroenterology;  Laterality: N/A;   COLOSTOMY REVERSAL N/A 09/20/2021   Procedure: Small Bowel Resection;  Surgeon: Mavis Anes, MD;  Location: [facility];  Service: General;  Laterality: N/A;   MANDIBLE SURGERY  1985   PARTIAL COLECTOMY N/A 05/17/2021   Procedure: PARTIAL COLECTOMY WITH COLOSTOMY;  Surgeon: Mavis Anes, MD;  Location: [facility];  Service: General;  Laterality: N/A;   PELVIC LYMPH NODE DISSECTION N/A 05/17/2021   Procedure: PELVIC DISSECTION;  Surgeon: Jayne Vonn DEL, MD;  Location: [facility];  Service: Gynecology;  Laterality: N/A;   TEE WITHOUT CARDIOVERSION N/A 04/09/2023   Procedure: TRANSESOPHAGEAL ECHOCARDIOGRAM (TEE);  Surgeon: Mallipeddi, Vishnu P, MD;  Location: [facility];  Service: Cardiovascular;  Laterality: N/A;    Family History  Problem Relation Age of Onset   Congestive Heart Failure Mother    Uterine cancer Mother    Pancreatic cancer Father    Atrial fibrillation Brother    Stroke Maternal Grandmother    Heart disease Maternal Grandmother    Glaucoma Maternal Grandfather    Social History[1]  Allergies[2]  Active Medications[3]  Objective: BP (!) 191/106   Pulse 97   Ht 5' 4 (1.626 m)   Wt 118 lb (53.5 kg)   BMI 20.25 kg/m   Physical Exam:    General: Alert and oriented., No acute distress., and Seated in a wheelchair. Gait: Unable to ambulate.  Physical Exam She is sitting comfortably in a chair.  She is able to maintain a straight leg raise bilaterally.  Diffuse swelling of bilateral feet.  No pain with axial loading of the right leg.  Pain with axial loading of the left leg.  She has some pain with internal/external rotation of the left hip.  Sensation intact to the dorsum of her feet.    IMAGING: I personally reviewed images previously obtained from the ED   Pelvic  MRI  IMPRESSION: 1. Acute nondisplaced fracture of the left superior pubic ramus-acetabular junction, left pubic body, and left inferior pubic ramus with surrounding marrow edema. 2. Acute bilateral nondisplaced sacral insufficiency fractures with severe surrounding marrow edema, left worse than right. 3. Severe muscle edema in the abductor brevis, operator externus, and adductor magnus consistent with muscle strain. 4. Moderate osteoarthritis of the left hip.     New Medications:  No orders of the defined types were placed in this encounter.     Portions of this note were completed via Scientist, clinical (histocompatibility and immunogenetics).  Anes DELENA Horde, MD  07/08/2024 8:58 AM        [1]  Social History Tobacco Use   Smoking status: Every Day    Current packs/day: 0.00    Average packs/day: 0.5 packs/day for 46.0 years (23.0 ttl pk-yrs)    Types: Cigarettes    Last attempt to quit: 03/05/2023    Years since quitting: 1.3   Smokeless tobacco: Never  Vaping Use  Vaping status: Never Used  Substance Use Topics   Alcohol  use: Yes    Alcohol /week: 7.0 standard drinks of alcohol     Types: 7 Glasses of wine per week   Drug use: Never  [2]  Allergies Allergen Reactions   Dust Mite Extract Other (See Comments)   Grass Extracts [Gramineae Pollens] Other (See Comments)    Dust, mold, mites Congestion   Grass Pollen(K-O-R-T-Swt Vern) Other (See Comments)   Other Other (See Comments)    3-Hydroxy-3-Methylglutaryl-Coenzyme A Reductase Inhibitor   Statins Other (See Comments)    Stiffness in body  [3]  Current Meds  Medication Sig   acetaminophen  (TYLENOL ) 500 MG tablet Take 2 tablets (1,000 mg total) by mouth every 8 (eight) hours.   apixaban  (ELIQUIS ) 5 MG TABS tablet TAKE 1 TABLET(5 MG) BY MOUTH TWICE DAILY   cholecalciferol (VITAMIN D3) 25 MCG (1000 UNIT) tablet Take 1,000 Units by mouth daily.   clonazePAM  (KLONOPIN ) 1 MG tablet Take 1 tablet (1 mg total) by mouth daily as needed for  anxiety. (Patient taking differently: Take 0.5 mg by mouth daily as needed for anxiety.)   cyanocobalamin  (VITAMIN B12) 1000 MCG tablet Take by mouth every other day.   cyclobenzaprine  (FLEXERIL ) 5 MG tablet Take 1 tablet 3 times a day by oral route, for muscle relaxant.   diphenhydrAMINE  (BENADRYL ) 25 MG tablet Take 25 mg by mouth at bedtime as needed for allergies.   furosemide  (LASIX ) 20 MG tablet Take 1 tablet (20 mg total) by mouth daily as needed for edema.   levothyroxine  (SYNTHROID ) 25 MCG tablet Take 1 tablet (25 mcg total) by mouth daily before breakfast.   loperamide  (IMODIUM ) 2 MG capsule Take 4 mg by mouth as needed for diarrhea or loose stools.   methocarbamol  (ROBAXIN ) 500 MG tablet Take 1 tablet (500 mg total) by mouth every 8 (eight) hours as needed for muscle spasms.   metoprolol  tartrate (LOPRESSOR ) 25 MG tablet Take 1 tablet (25 mg total) by mouth 2 (two) times daily.   oxyCODONE  (OXY IR/ROXICODONE ) 5 MG immediate release tablet Take 1 tablet (5 mg total) by mouth every 6 (six) hours as needed for moderate pain (pain score 4-6).   Polyvinyl Alcohol -Povidone (REFRESH OP) Place 1 drop into both eyes daily as needed (dry eyes).   predniSONE  (DELTASONE ) 10 MG tablet Take 5 tablets (50 mg total) by mouth daily with breakfast. And decrease by one tablet daily

## 2024-07-14 ENCOUNTER — Other Ambulatory Visit (HOSPITAL_COMMUNITY)

## 2024-07-14 ENCOUNTER — Ambulatory Visit (HOSPITAL_COMMUNITY)
Admission: RE | Admit: 2024-07-14 | Discharge: 2024-07-14 | Disposition: A | Discharge status: Home / Self Care | Source: Ambulatory Visit | Attending: Internal Medicine | Admitting: Internal Medicine

## 2024-07-14 DIAGNOSIS — Z13820 Encounter for screening for osteoporosis: Secondary | ICD-10-CM | POA: Insufficient documentation

## 2024-07-14 DIAGNOSIS — M810 Age-related osteoporosis without current pathological fracture: Secondary | ICD-10-CM | POA: Diagnosis not present

## 2024-07-29 ENCOUNTER — Ambulatory Visit: Admitting: Orthopedic Surgery

## 2024-07-29 ENCOUNTER — Other Ambulatory Visit: Payer: Self-pay

## 2024-07-29 DIAGNOSIS — S32592A Other specified fracture of left pubis, initial encounter for closed fracture: Secondary | ICD-10-CM | POA: Diagnosis not present

## 2024-07-29 DIAGNOSIS — S3210XD Unspecified fracture of sacrum, subsequent encounter for fracture with routine healing: Secondary | ICD-10-CM

## 2024-07-29 DIAGNOSIS — S32592D Other specified fracture of left pubis, subsequent encounter for fracture with routine healing: Secondary | ICD-10-CM

## 2024-07-29 DIAGNOSIS — S322XXA Fracture of coccyx, initial encounter for closed fracture: Secondary | ICD-10-CM

## 2024-07-29 DIAGNOSIS — S32512A Fracture of superior rim of left pubis, initial encounter for closed fracture: Secondary | ICD-10-CM

## 2024-07-29 DIAGNOSIS — S32512D Fracture of superior rim of left pubis, subsequent encounter for fracture with routine healing: Secondary | ICD-10-CM

## 2024-07-29 DIAGNOSIS — S3210XA Unspecified fracture of sacrum, initial encounter for closed fracture: Secondary | ICD-10-CM | POA: Diagnosis not present

## 2024-07-31 ENCOUNTER — Encounter: Payer: Self-pay | Admitting: Orthopedic Surgery

## 2024-07-31 NOTE — Progress Notes (Signed)
 Return patient Visit  Summary: Brandi Rowe is a 69 y.o. female with the following: Left sided superior and inferior pubic rami fractures Insufficiency fractures of sacral ala   Assessment & Plan Brandi Rowe is doing better overall.  Radiographs remain stable.  Urged her to continue with weightbearing as tolerated.  The pain will continue to improve.  Provided reassurance.  I would like to see her back in 1 month.     Follow-up: Return in about 4 weeks (around 08/26/2024).  Subjective:  Chief Complaint  Patient presents with   Back Injury    Left sided superior and inferior pubic rami fractures and Insufficiency fractures of sacrum beginning of Dec// improving         Discussed the use of AI scribe software for clinical note transcription with the patient, who gave verbal consent to proceed.  History of Present Illness Brandi Rowe is a 69 year old female who returns with severe pain following a fall. She was referred by Dr. Shona for evaluation of her pelvic fractures.  She sustained a fall approximately 6 weeks ago.  She was diagnosed with left sided superior and inferior pubic rami fractures, as well as insufficiency fractures within the sacrum.  Her pain is improving.  She does continue to have some discomfort.  She is taking medicines, primarily at nighttime.  She is able to ambulate more easily, using a walker.  She is able to stand for extended periods of time, but does state that she has increasing discomfort the longer she is standing.  No numbness or tingling.    Review of Systems: No fevers or chills No numbness or tingling No chest pain No shortness of breath No bowel or bladder dysfunction No GI distress No headaches   Objective: There were no vitals taken for this visit.  Physical Exam:    General: Alert and oriented., No acute distress., and Seated in a wheelchair. Gait: Ambulates with the assistance of a walker.  Physical Exam She is  seated in a wheelchair.  She is able to maintain a straight leg raise.  No pain with axial loading.  Toes are warm and well-perfused.  Sensation intact in bilateral lower extremities.   IMAGING: I personally ordered and reviewed the following images   AP pelvis was obtained in clinic today.  Superior and inferior pubic rami fractures on the left are visible.  There is been no interval displacement.  There is no obvious callus formation.  No additional injuries are noted.  No bony lesions.  Impression: Stable pubic rami fracture     New Medications:  No orders of the defined types were placed in this encounter.     Portions of this note were completed via Scientist, clinical (histocompatibility and immunogenetics).  Oneil DELENA Horde, MD  07/31/2024 8:36 AM

## 2024-08-07 ENCOUNTER — Other Ambulatory Visit (HOSPITAL_COMMUNITY): Payer: Self-pay | Admitting: Family Medicine

## 2024-08-07 DIAGNOSIS — M810 Age-related osteoporosis without current pathological fracture: Secondary | ICD-10-CM | POA: Insufficient documentation

## 2024-08-07 NOTE — Progress Notes (Signed)
 Referral for denosumab received. Patient has [facility] Medicare - Jubbonti preferred  Last calcium  on 06/29/2024 was wnl  Sherry Pennant, PharmD, MPH, BCPS, CPP Clinical Pharmacist

## 2024-08-10 ENCOUNTER — Telehealth: Payer: Self-pay

## 2024-08-10 NOTE — Telephone Encounter (Signed)
 Auth Submission: APPROVED Site of care: Site of care: [facility] Payer: [facility] medicare Medication & CPT/J Code(s) submitted: Jubbonti (denosumab-bbdz) [phone] Diagnosis Code:  Route of submission (phone, fax, portal): portal Phone # Fax # Auth type: Buy/Bill PB Units/visits requested: 60mg  q41months x 2 doses Reference number: [id number] Approval from: 08/10/24 to 08/10/25

## 2024-08-12 ENCOUNTER — Ambulatory Visit

## 2024-08-21 ENCOUNTER — Encounter: Attending: Internal Medicine | Admitting: Internal Medicine

## 2024-08-21 VITALS — BP 186/99 | HR 80 | Resp 16

## 2024-08-21 DIAGNOSIS — M810 Age-related osteoporosis without current pathological fracture: Secondary | ICD-10-CM

## 2024-08-21 MED ORDER — DENOSUMAB-BBDZ 60 MG/ML ~~LOC~~ SOSY
60.0000 mg | PREFILLED_SYRINGE | Freq: Once | SUBCUTANEOUS | Status: AC
  Administered 2024-08-21: 60 mg via SUBCUTANEOUS

## 2024-08-21 NOTE — Progress Notes (Signed)
 Diagnosis:  Osteoporosis  Provider:  Hall, Zack MD  Procedure: Injection  Jubbonti , Dose: 60 mg, Site: subcutaneous, Number of injections: 1  Injection Site(s): Left arm  Post Care: Observation period completed  Discharge: Condition: Good, Destination: Home . AVS Declined  Performed by:  Eh Sesay R, LPN

## 2024-08-26 ENCOUNTER — Encounter: Payer: Self-pay | Admitting: Orthopedic Surgery

## 2024-08-26 ENCOUNTER — Ambulatory Visit: Admitting: Orthopedic Surgery

## 2024-08-26 ENCOUNTER — Other Ambulatory Visit: Payer: Self-pay

## 2024-08-26 DIAGNOSIS — S3210XD Unspecified fracture of sacrum, subsequent encounter for fracture with routine healing: Secondary | ICD-10-CM

## 2024-08-26 NOTE — Progress Notes (Signed)
 Return patient Visit  Summary: Brandi Rowe is a 69 y.o. female with the following: Left sided superior and inferior pubic rami fractures Insufficiency fractures of sacral ala   Assessment & Plan Brandi Rowe has done well.  She sustained injuries greater than 2 months ago.  Encouraged her to continue working on strengthening, and ambulate is much as possible.  If she continues to struggle, have recommended physical therapy.  She will contact the clinic.  For now, she would like to try an and continue with rehabilitation on her own.  Radiographs remain stable.  She will follow-up as needed.     Follow-up: Return if symptoms worsen or fail to improve.  Subjective:  Chief Complaint  Patient presents with   Fracture    Sacrum/Coccyx end of Nov' 25     Discussed the use of AI scribe software for clinical note transcription with the patient, who gave verbal consent to proceed.  History of Present Illness Brandi Rowe is a 69 year old female who returns with severe pain following a fall. She was referred by Dr. Shona for evaluation of her pelvic fractures.  She fell in the sustained injuries to her pelvis, a little over 2 months ago.  Her pain is much better.  She is no longer using assistive device.  She states that she is scared that she will fall again.  She has been reluctant to go outside, given the ice and snowy weather.  She has aching pains in the left groin, as well as the lower back.    Review of Systems: No fevers or chills No numbness or tingling No chest pain No shortness of breath No bowel or bladder dysfunction No GI distress No headaches   Objective: There were no vitals taken for this visit.  Physical Exam:    General: Alert and oriented., No acute distress., and Seated in a wheelchair. Gait: Slow, steady gait.  No assistive device.  Physical Exam She is ambulating by herself.  She is able to maintain a straight leg raise bilaterally.  No pain  with axial loading.  She tolerates gentle range of motion of bilateral hips.   IMAGING: I personally ordered and reviewed the following images   AP pelvis was obtained in clinic today.  These are compared to prior x-rays.  Pubic rami fractures on the left remain in stable alignment.  There is some callus formation visible.  No interval displacement.  No new injuries.  No bony lesions.  Impression: Stable pubic rami fractures     New Medications:  No orders of the defined types were placed in this encounter.     Portions of this note were completed via Scientist, clinical (histocompatibility and immunogenetics).  Oneil DELENA Horde, MD  08/26/2024 1:43 PM

## 2025-02-19 ENCOUNTER — Ambulatory Visit
